# Patient Record
Sex: Female | Born: 1955 | Race: White | Hispanic: No | Smoking: Never smoker
Health system: Southern US, Community
[De-identification: ages and names within clinical notes are randomized; demographics above are authoritative.]

## PROBLEM LIST (undated history)

## (undated) DIAGNOSIS — J189 Pneumonia, unspecified organism: Secondary | ICD-10-CM

## (undated) DIAGNOSIS — T7840XA Allergy, unspecified, initial encounter: Secondary | ICD-10-CM

## (undated) DIAGNOSIS — I471 Supraventricular tachycardia, unspecified: Secondary | ICD-10-CM

## (undated) DIAGNOSIS — Z87442 Personal history of urinary calculi: Secondary | ICD-10-CM

## (undated) DIAGNOSIS — D649 Anemia, unspecified: Secondary | ICD-10-CM

## (undated) DIAGNOSIS — E669 Obesity, unspecified: Secondary | ICD-10-CM

## (undated) DIAGNOSIS — M1990 Unspecified osteoarthritis, unspecified site: Secondary | ICD-10-CM

## (undated) DIAGNOSIS — F988 Other specified behavioral and emotional disorders with onset usually occurring in childhood and adolescence: Secondary | ICD-10-CM

## (undated) DIAGNOSIS — T4145XA Adverse effect of unspecified anesthetic, initial encounter: Secondary | ICD-10-CM

## (undated) DIAGNOSIS — R0600 Dyspnea, unspecified: Secondary | ICD-10-CM

## (undated) DIAGNOSIS — D126 Benign neoplasm of colon, unspecified: Secondary | ICD-10-CM

## (undated) DIAGNOSIS — E039 Hypothyroidism, unspecified: Secondary | ICD-10-CM

## (undated) DIAGNOSIS — K219 Gastro-esophageal reflux disease without esophagitis: Secondary | ICD-10-CM

## (undated) DIAGNOSIS — I499 Cardiac arrhythmia, unspecified: Secondary | ICD-10-CM

## (undated) DIAGNOSIS — C50919 Malignant neoplasm of unspecified site of unspecified female breast: Secondary | ICD-10-CM

## (undated) DIAGNOSIS — Z8719 Personal history of other diseases of the digestive system: Secondary | ICD-10-CM

## (undated) DIAGNOSIS — K648 Other hemorrhoids: Secondary | ICD-10-CM

## (undated) HISTORY — PX: GASTRIC BYPASS: SHX52

## (undated) HISTORY — DX: Anemia, unspecified: D64.9

## (undated) HISTORY — DX: Malignant neoplasm of unspecified site of unspecified female breast: C50.919

## (undated) HISTORY — PX: BREAST LUMPECTOMY: SHX2

## (undated) HISTORY — DX: Allergy, unspecified, initial encounter: T78.40XA

## (undated) HISTORY — DX: Other specified behavioral and emotional disorders with onset usually occurring in childhood and adolescence: F98.8

## (undated) HISTORY — DX: Other hemorrhoids: K64.8

## (undated) HISTORY — DX: Hypothyroidism, unspecified: E03.9

## (undated) HISTORY — DX: Benign neoplasm of colon, unspecified: D12.6

## (undated) HISTORY — PX: COLONOSCOPY: SHX174

## (undated) HISTORY — PX: MASTECTOMY: SHX3

## (undated) HISTORY — PX: LITHOTRIPSY: SUR834

## (undated) HISTORY — PX: VAGINAL HYSTERECTOMY: SUR661

## (undated) HISTORY — DX: Obesity, unspecified: E66.9

## (undated) HISTORY — PX: KNEE ARTHROSCOPY: SHX127

## (undated) HISTORY — PX: JOINT REPLACEMENT: SHX530

## (undated) HISTORY — PX: ESOPHAGOGASTRODUODENOSCOPY: SHX1529

## (undated) HISTORY — PX: CHOLECYSTECTOMY: SHX55

---

## 1998-06-25 ENCOUNTER — Observation Stay (HOSPITAL_COMMUNITY): Admission: EM | Admit: 1998-06-25 | Discharge: 1998-06-26 | Payer: Self-pay

## 1998-06-27 ENCOUNTER — Ambulatory Visit (HOSPITAL_COMMUNITY): Admission: RE | Admit: 1998-06-27 | Discharge: 1998-06-27 | Payer: Self-pay | Admitting: Urology

## 1998-08-22 ENCOUNTER — Ambulatory Visit (HOSPITAL_COMMUNITY): Admission: RE | Admit: 1998-08-22 | Discharge: 1998-08-22 | Payer: Self-pay | Admitting: Urology

## 1999-04-17 ENCOUNTER — Other Ambulatory Visit: Admission: RE | Admit: 1999-04-17 | Discharge: 1999-04-17 | Payer: Self-pay | Admitting: Orthopedic Surgery

## 2000-06-26 ENCOUNTER — Encounter: Payer: Self-pay | Admitting: Urology

## 2000-06-26 ENCOUNTER — Ambulatory Visit (HOSPITAL_COMMUNITY): Admission: EM | Admit: 2000-06-26 | Discharge: 2000-06-26 | Payer: Self-pay | Admitting: Internal Medicine

## 2000-07-01 ENCOUNTER — Encounter: Payer: Self-pay | Admitting: Urology

## 2000-07-01 ENCOUNTER — Ambulatory Visit (HOSPITAL_COMMUNITY): Admission: RE | Admit: 2000-07-01 | Discharge: 2000-07-01 | Payer: Self-pay | Admitting: Urology

## 2001-03-11 ENCOUNTER — Other Ambulatory Visit: Admission: RE | Admit: 2001-03-11 | Discharge: 2001-03-11 | Payer: Self-pay | Admitting: Obstetrics and Gynecology

## 2002-01-03 ENCOUNTER — Encounter: Payer: Self-pay | Admitting: Oncology

## 2002-01-03 ENCOUNTER — Encounter: Admission: RE | Admit: 2002-01-03 | Discharge: 2002-01-03 | Payer: Self-pay | Admitting: Oncology

## 2002-01-03 ENCOUNTER — Encounter (INDEPENDENT_AMBULATORY_CARE_PROVIDER_SITE_OTHER): Payer: Self-pay | Admitting: Specialist

## 2002-07-21 ENCOUNTER — Encounter: Admission: RE | Admit: 2002-07-21 | Discharge: 2002-07-21 | Payer: Self-pay | Admitting: Oncology

## 2002-07-21 ENCOUNTER — Encounter: Payer: Self-pay | Admitting: Oncology

## 2002-12-28 DIAGNOSIS — T8859XA Other complications of anesthesia, initial encounter: Secondary | ICD-10-CM

## 2002-12-28 HISTORY — DX: Other complications of anesthesia, initial encounter: T88.59XA

## 2003-03-02 ENCOUNTER — Encounter: Admission: RE | Admit: 2003-03-02 | Discharge: 2003-03-02 | Payer: Self-pay | Admitting: Oncology

## 2003-03-02 ENCOUNTER — Encounter: Payer: Self-pay | Admitting: Oncology

## 2004-03-14 ENCOUNTER — Encounter: Admission: RE | Admit: 2004-03-14 | Discharge: 2004-03-14 | Payer: Self-pay | Admitting: Oncology

## 2005-03-27 ENCOUNTER — Encounter: Admission: RE | Admit: 2005-03-27 | Discharge: 2005-03-27 | Payer: Self-pay | Admitting: Oncology

## 2005-07-16 ENCOUNTER — Ambulatory Visit: Payer: Self-pay | Admitting: Oncology

## 2005-08-12 ENCOUNTER — Other Ambulatory Visit: Admission: RE | Admit: 2005-08-12 | Discharge: 2005-08-12 | Payer: Self-pay | Admitting: Obstetrics and Gynecology

## 2005-08-22 ENCOUNTER — Emergency Department (HOSPITAL_COMMUNITY): Admission: EM | Admit: 2005-08-22 | Discharge: 2005-08-22 | Payer: Self-pay | Admitting: Emergency Medicine

## 2006-04-30 ENCOUNTER — Encounter: Admission: RE | Admit: 2006-04-30 | Discharge: 2006-04-30 | Payer: Self-pay | Admitting: Oncology

## 2006-07-28 ENCOUNTER — Ambulatory Visit: Payer: Self-pay | Admitting: Oncology

## 2006-07-30 LAB — CBC WITH DIFFERENTIAL/PLATELET
Basophils Absolute: 0 10*3/uL (ref 0.0–0.1)
EOS%: 7.2 % — ABNORMAL HIGH (ref 0.0–7.0)
Eosinophils Absolute: 0.3 10*3/uL (ref 0.0–0.5)
HCT: 40.3 % (ref 34.8–46.6)
HGB: 13.7 g/dL (ref 11.6–15.9)
MCH: 30.2 pg (ref 26.0–34.0)
MCV: 89.1 fL (ref 81.0–101.0)
MONO%: 6.9 % (ref 0.0–13.0)
NEUT#: 2 10*3/uL (ref 1.5–6.5)
NEUT%: 51.3 % (ref 39.6–76.8)
Platelets: 158 10*3/uL (ref 145–400)
RDW: 14.3 % (ref 11.3–14.5)

## 2006-07-30 LAB — COMPREHENSIVE METABOLIC PANEL
AST: 19 U/L (ref 0–37)
Albumin: 4 g/dL (ref 3.5–5.2)
Alkaline Phosphatase: 80 U/L (ref 39–117)
BUN: 6 mg/dL (ref 6–23)
Calcium: 8.9 mg/dL (ref 8.4–10.5)
Creatinine, Ser: 0.62 mg/dL (ref 0.40–1.20)
Glucose, Bld: 94 mg/dL (ref 70–99)
Potassium: 4.4 mEq/L (ref 3.5–5.3)

## 2006-08-03 LAB — LIPID PANEL
HDL: 54 mg/dL (ref >39)
LDL Cholesterol: 71 mg/dL (ref 0–99)
Total CHOL/HDL Ratio: 2.6 Ratio
Triglycerides: 69 mg/dL (ref <150)
VLDL: 14 mg/dL (ref 0–40)

## 2007-05-06 ENCOUNTER — Ambulatory Visit: Payer: Self-pay | Admitting: Internal Medicine

## 2007-05-19 ENCOUNTER — Encounter: Admission: RE | Admit: 2007-05-19 | Discharge: 2007-05-19 | Payer: Self-pay | Admitting: Oncology

## 2007-05-20 ENCOUNTER — Encounter: Payer: Self-pay | Admitting: Internal Medicine

## 2007-05-20 ENCOUNTER — Encounter: Admission: RE | Admit: 2007-05-20 | Discharge: 2007-05-20 | Payer: Self-pay | Admitting: Oncology

## 2007-05-20 ENCOUNTER — Ambulatory Visit: Payer: Self-pay | Admitting: Internal Medicine

## 2007-05-24 ENCOUNTER — Encounter: Admission: RE | Admit: 2007-05-24 | Discharge: 2007-05-24 | Payer: Self-pay | Admitting: Oncology

## 2007-05-24 ENCOUNTER — Encounter (INDEPENDENT_AMBULATORY_CARE_PROVIDER_SITE_OTHER): Payer: Self-pay | Admitting: Radiology

## 2007-06-02 ENCOUNTER — Ambulatory Visit (HOSPITAL_COMMUNITY): Admission: RE | Admit: 2007-06-02 | Discharge: 2007-06-02 | Payer: Self-pay | Admitting: Surgery

## 2007-06-06 ENCOUNTER — Ambulatory Visit: Payer: Self-pay | Admitting: Oncology

## 2007-06-29 ENCOUNTER — Encounter (INDEPENDENT_AMBULATORY_CARE_PROVIDER_SITE_OTHER): Payer: Self-pay | Admitting: Obstetrics and Gynecology

## 2007-06-29 ENCOUNTER — Ambulatory Visit (HOSPITAL_COMMUNITY): Admission: RE | Admit: 2007-06-29 | Discharge: 2007-06-30 | Payer: Self-pay | Admitting: Obstetrics and Gynecology

## 2007-07-21 ENCOUNTER — Inpatient Hospital Stay (HOSPITAL_COMMUNITY): Admission: RE | Admit: 2007-07-21 | Discharge: 2007-07-23 | Payer: Self-pay | Admitting: Surgery

## 2007-07-21 ENCOUNTER — Encounter (INDEPENDENT_AMBULATORY_CARE_PROVIDER_SITE_OTHER): Payer: Self-pay | Admitting: Surgery

## 2007-08-04 ENCOUNTER — Ambulatory Visit: Payer: Self-pay | Admitting: Oncology

## 2007-08-08 LAB — CBC WITH DIFFERENTIAL/PLATELET
BASO%: 0.6 % (ref 0.0–2.0)
EOS%: 4.4 % (ref 0.0–7.0)
HCT: 34.5 % — ABNORMAL LOW (ref 34.8–46.6)
MCH: 29.2 pg (ref 26.0–34.0)
MCHC: 34.4 g/dL (ref 32.0–36.0)
NEUT%: 58.3 % (ref 39.6–76.8)
RBC: 4.06 10*6/uL (ref 3.70–5.32)
lymph#: 1.4 10*3/uL (ref 0.9–3.3)

## 2007-08-08 LAB — COMPREHENSIVE METABOLIC PANEL
ALT: 32 U/L (ref 0–35)
AST: 16 U/L (ref 0–37)
Chloride: 104 mEq/L (ref 96–112)
Creatinine, Ser: 0.59 mg/dL (ref 0.40–1.20)
Sodium: 142 mEq/L (ref 135–145)
Total Bilirubin: 0.4 mg/dL (ref 0.3–1.2)

## 2007-08-08 LAB — LIPID PANEL
Total CHOL/HDL Ratio: 3.1 Ratio
VLDL: 17 mg/dL (ref 0–40)

## 2007-08-19 ENCOUNTER — Encounter: Admission: RE | Admit: 2007-08-19 | Discharge: 2007-08-19 | Payer: Self-pay | Admitting: Oncology

## 2007-10-05 ENCOUNTER — Ambulatory Visit: Payer: Self-pay | Admitting: Oncology

## 2008-02-10 ENCOUNTER — Ambulatory Visit: Payer: Self-pay | Admitting: Oncology

## 2008-02-14 LAB — COMPREHENSIVE METABOLIC PANEL
ALT: 18 U/L (ref 0–35)
AST: 16 U/L (ref 0–37)
Albumin: 3.8 g/dL (ref 3.5–5.2)
Alkaline Phosphatase: 109 U/L (ref 39–117)
BUN: 6 mg/dL (ref 6–23)
CO2: 28 mEq/L (ref 19–32)
Calcium: 9.2 mg/dL (ref 8.4–10.5)
Chloride: 106 mEq/L (ref 96–112)
Creatinine, Ser: 0.59 mg/dL (ref 0.40–1.20)
Glucose, Bld: 92 mg/dL (ref 70–99)
Potassium: 4.1 mEq/L (ref 3.5–5.3)
Sodium: 142 mEq/L (ref 135–145)
Total Bilirubin: 0.4 mg/dL (ref 0.3–1.2)
Total Protein: 6.8 g/dL (ref 6.0–8.3)

## 2008-02-14 LAB — CBC WITH DIFFERENTIAL/PLATELET
BASO%: 0.4 % (ref 0.0–2.0)
Basophils Absolute: 0 10*3/uL (ref 0.0–0.1)
EOS%: 5.1 % (ref 0.0–7.0)
Eosinophils Absolute: 0.2 10*3/uL (ref 0.0–0.5)
HCT: 37.3 % (ref 34.8–46.6)
HGB: 12.4 g/dL (ref 11.6–15.9)
LYMPH%: 21.5 % (ref 14.0–48.0)
MCH: 27.1 pg (ref 26.0–34.0)
MCHC: 33.3 g/dL (ref 32.0–36.0)
MCV: 81.5 fL (ref 81.0–101.0)
MONO#: 0.1 10*3/uL (ref 0.1–0.9)
MONO%: 2.9 % (ref 0.0–13.0)
NEUT#: 3.3 10*3/uL (ref 1.5–6.5)
NEUT%: 70.1 % (ref 39.6–76.8)
Platelets: 185 10*3/uL (ref 145–400)
RBC: 4.58 10*6/uL (ref 3.70–5.32)
RDW: 14.8 % — ABNORMAL HIGH (ref 11.3–14.5)
WBC: 4.7 10*3/uL (ref 3.9–10.0)
lymph#: 1 10*3/uL (ref 0.9–3.3)

## 2008-02-28 ENCOUNTER — Inpatient Hospital Stay (HOSPITAL_COMMUNITY): Admission: RE | Admit: 2008-02-28 | Discharge: 2008-03-02 | Payer: Self-pay | Admitting: Plastic Surgery

## 2008-02-28 ENCOUNTER — Encounter (INDEPENDENT_AMBULATORY_CARE_PROVIDER_SITE_OTHER): Payer: Self-pay | Admitting: Plastic Surgery

## 2008-03-21 ENCOUNTER — Ambulatory Visit (HOSPITAL_COMMUNITY): Admission: RE | Admit: 2008-03-21 | Discharge: 2008-03-21 | Payer: Self-pay | Admitting: Plastic Surgery

## 2008-03-28 ENCOUNTER — Ambulatory Visit (HOSPITAL_COMMUNITY): Admission: RE | Admit: 2008-03-28 | Discharge: 2008-03-28 | Payer: Self-pay | Admitting: Plastic Surgery

## 2008-04-05 ENCOUNTER — Ambulatory Visit (HOSPITAL_COMMUNITY): Admission: RE | Admit: 2008-04-05 | Discharge: 2008-04-05 | Payer: Self-pay | Admitting: Plastic Surgery

## 2008-04-26 ENCOUNTER — Ambulatory Visit: Payer: Self-pay | Admitting: Family Medicine

## 2008-06-11 ENCOUNTER — Ambulatory Visit: Payer: Self-pay

## 2008-06-11 ENCOUNTER — Encounter: Payer: Self-pay | Admitting: Family Medicine

## 2008-06-20 ENCOUNTER — Ambulatory Visit: Payer: Self-pay | Admitting: Oncology

## 2008-06-22 LAB — CBC WITH DIFFERENTIAL/PLATELET
Basophils Absolute: 0 10*3/uL (ref 0.0–0.1)
EOS%: 6.3 % (ref 0.0–7.0)
LYMPH%: 30 % (ref 14.0–48.0)
MCH: 24.6 pg — ABNORMAL LOW (ref 26.0–34.0)
MCV: 74.3 fL — ABNORMAL LOW (ref 81.0–101.0)
MONO%: 8.2 % (ref 0.0–13.0)
Platelets: 163 10*3/uL (ref 145–400)
RBC: 4.8 10*6/uL (ref 3.70–5.32)
RDW: 25.9 % — ABNORMAL HIGH (ref 11.3–14.5)

## 2008-06-22 LAB — COMPREHENSIVE METABOLIC PANEL
AST: 19 U/L (ref 0–37)
Albumin: 3.8 g/dL (ref 3.5–5.2)
Alkaline Phosphatase: 92 U/L (ref 39–117)
BUN: 7 mg/dL (ref 6–23)
Potassium: 3.7 mEq/L (ref 3.5–5.3)
Sodium: 141 mEq/L (ref 135–145)
Total Bilirubin: 0.3 mg/dL (ref 0.3–1.2)

## 2008-07-23 ENCOUNTER — Ambulatory Visit: Payer: Self-pay | Admitting: Family Medicine

## 2008-08-13 ENCOUNTER — Ambulatory Visit: Payer: Self-pay | Admitting: Family Medicine

## 2008-09-17 ENCOUNTER — Ambulatory Visit (HOSPITAL_COMMUNITY): Admission: RE | Admit: 2008-09-17 | Discharge: 2008-09-17 | Payer: Self-pay | Admitting: Plastic Surgery

## 2008-09-24 ENCOUNTER — Ambulatory Visit: Payer: Self-pay | Admitting: Family Medicine

## 2008-09-24 ENCOUNTER — Ambulatory Visit (HOSPITAL_COMMUNITY): Admission: RE | Admit: 2008-09-24 | Discharge: 2008-09-24 | Payer: Self-pay | Admitting: Plastic Surgery

## 2008-09-26 ENCOUNTER — Ambulatory Visit: Payer: Self-pay | Admitting: Oncology

## 2008-09-28 LAB — CBC WITH DIFFERENTIAL/PLATELET
BASO%: 0.4 % (ref 0.0–2.0)
LYMPH%: 32.5 % (ref 14.0–48.0)
MCH: 29 pg (ref 26.0–34.0)
MCHC: 33.9 g/dL (ref 32.0–36.0)
MCV: 85.5 fL (ref 81.0–101.0)
MONO%: 8.6 % (ref 0.0–13.0)
Platelets: 159 10*3/uL (ref 145–400)
RBC: 4.45 10*6/uL (ref 3.70–5.32)

## 2008-09-28 LAB — COMPREHENSIVE METABOLIC PANEL
Alkaline Phosphatase: 103 U/L (ref 39–117)
Glucose, Bld: 98 mg/dL (ref 70–99)
Sodium: 140 mEq/L (ref 135–145)
Total Bilirubin: 0.5 mg/dL (ref 0.3–1.2)
Total Protein: 6.6 g/dL (ref 6.0–8.3)

## 2009-01-31 ENCOUNTER — Ambulatory Visit: Payer: Self-pay | Admitting: Oncology

## 2009-02-04 LAB — CBC WITH DIFFERENTIAL/PLATELET
BASO%: 0.6 % (ref 0.0–2.0)
LYMPH%: 33.7 % (ref 14.0–48.0)
MCHC: 33.7 g/dL (ref 32.0–36.0)
MCV: 86.3 fL (ref 81.0–101.0)
MONO#: 0.3 10*3/uL (ref 0.1–0.9)
MONO%: 7 % (ref 0.0–13.0)
Platelets: 171 10*3/uL (ref 145–400)
RBC: 4.63 10*6/uL (ref 3.70–5.32)
RDW: 13.7 % (ref 11.3–14.5)
WBC: 4.9 10*3/uL (ref 3.9–10.0)

## 2009-02-04 LAB — COMPREHENSIVE METABOLIC PANEL
ALT: 26 U/L (ref 0–35)
Alkaline Phosphatase: 99 U/L (ref 39–117)
Sodium: 140 mEq/L (ref 135–145)
Total Bilirubin: 0.5 mg/dL (ref 0.3–1.2)
Total Protein: 6.5 g/dL (ref 6.0–8.3)

## 2009-02-24 IMAGING — CR DG CHEST 2V
2 series · 2 of 2 positions shown · non-contrast
Comparison: None.

CLINICAL DATA: Right breast carcinoma.  Preop respiratory exam. 
 CHEST ? 2 VIEW:

[view not recorded (1 of 2)]
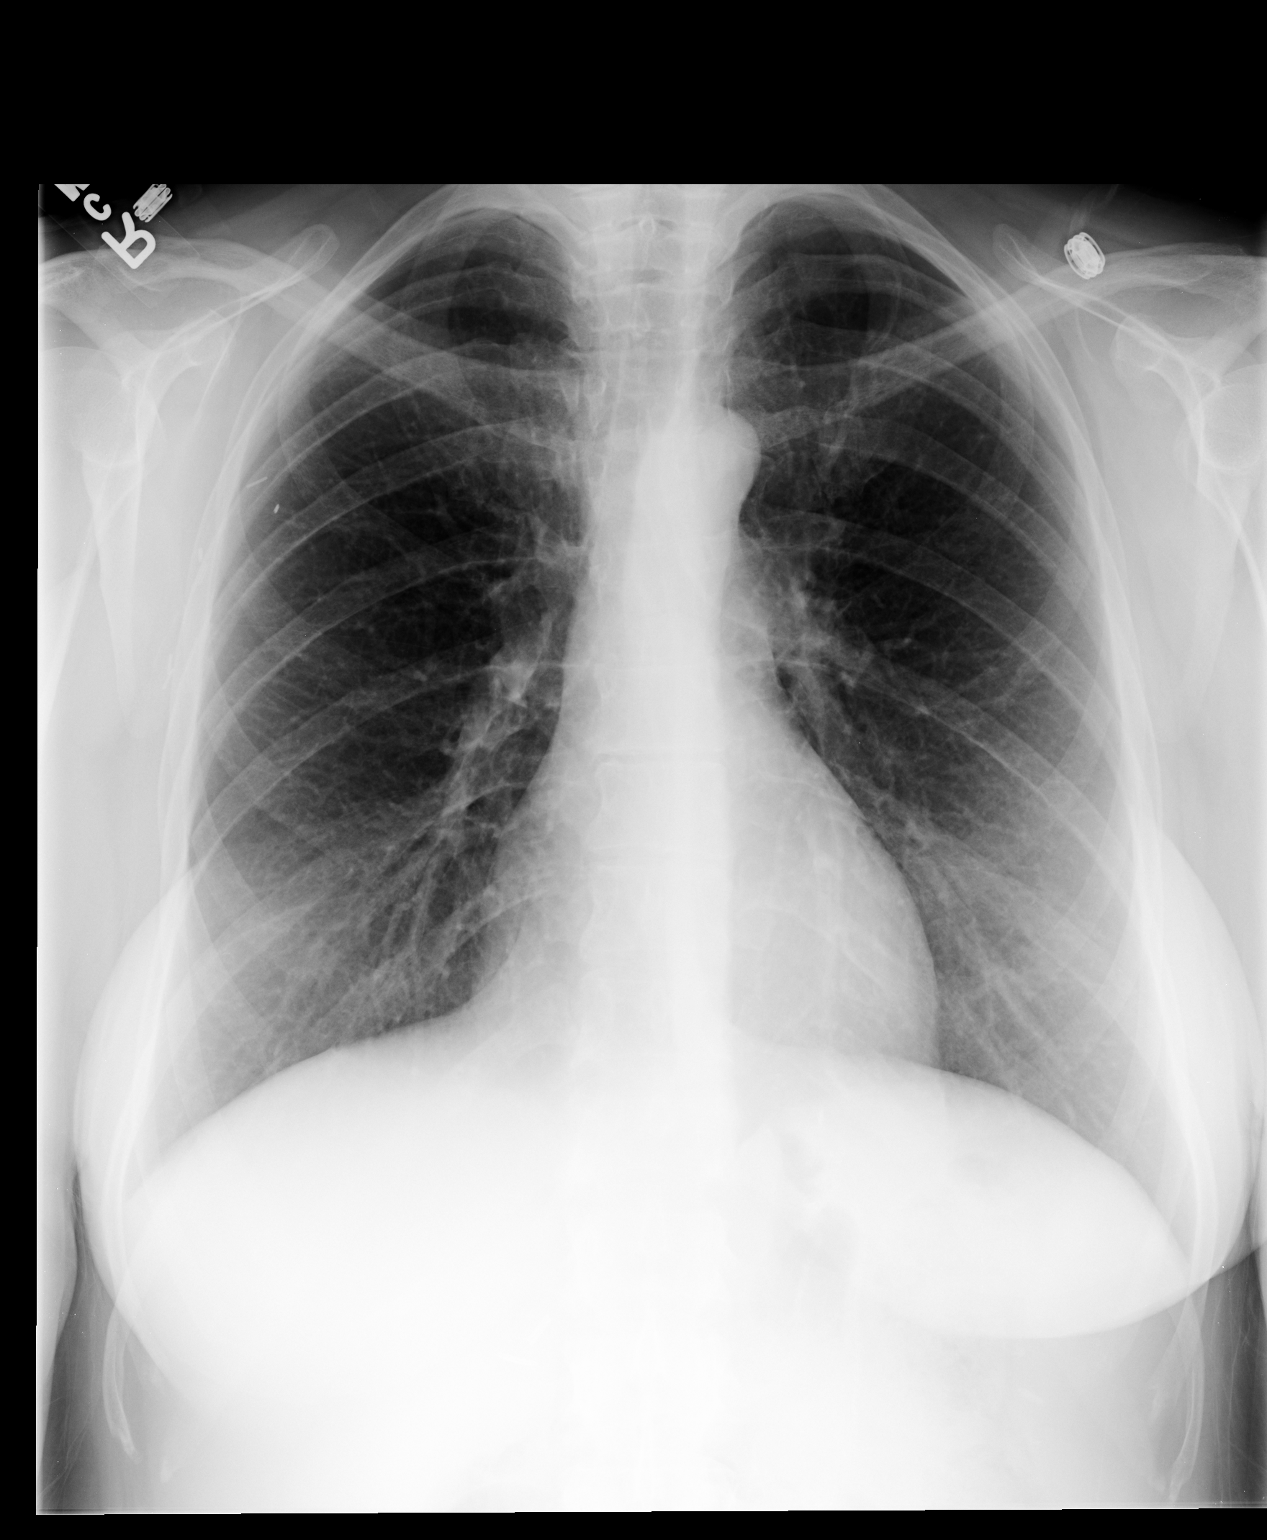

[view not recorded (2 of 2)]
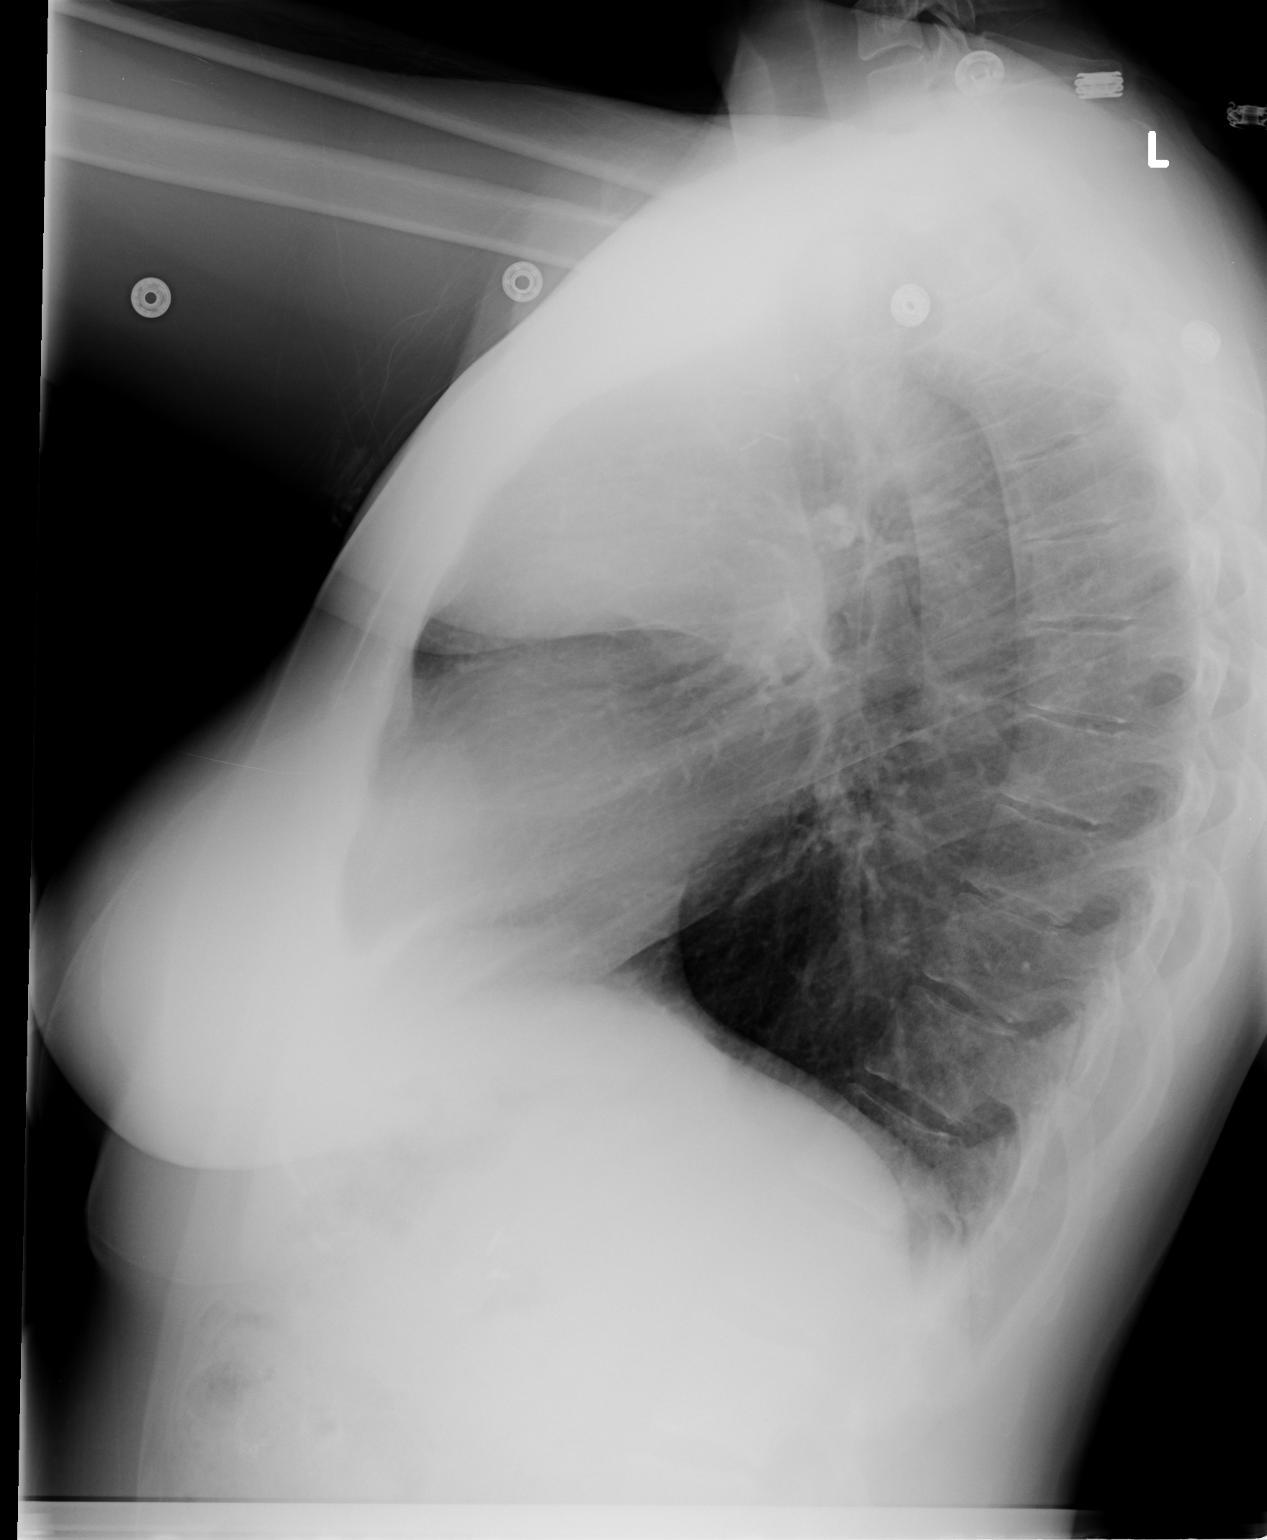

[2 of 2 positions shown; findings below may reference images not displayed]

FINDINGS: Heart size and mediastinal contours are normal.  Both lungs are clear.  There is no evidence of pleural effusion.  No mass or adenopathy is identified.  Surgical clips are seen within the right axillary region.
IMPRESSION: No active cardiopulmonary disease.

## 2009-03-14 ENCOUNTER — Ambulatory Visit: Payer: Self-pay | Admitting: Family Medicine

## 2009-03-14 ENCOUNTER — Encounter: Admission: RE | Admit: 2009-03-14 | Discharge: 2009-03-14 | Payer: Self-pay | Admitting: Family Medicine

## 2009-04-15 ENCOUNTER — Ambulatory Visit: Payer: Self-pay | Admitting: Family Medicine

## 2009-06-13 ENCOUNTER — Ambulatory Visit: Payer: Self-pay | Admitting: Oncology

## 2009-06-17 LAB — COMPREHENSIVE METABOLIC PANEL
ALT: 20 U/L (ref 0–35)
Albumin: 3.9 g/dL (ref 3.5–5.2)
CO2: 26 mEq/L (ref 19–32)
Potassium: 4.3 mEq/L (ref 3.5–5.3)
Sodium: 139 mEq/L (ref 135–145)
Total Bilirubin: 0.5 mg/dL (ref 0.3–1.2)
Total Protein: 6.5 g/dL (ref 6.0–8.3)

## 2009-06-17 LAB — CBC WITH DIFFERENTIAL/PLATELET
Eosinophils Absolute: 0.3 10*3/uL (ref 0.0–0.5)
MCV: 86.9 fL (ref 79.5–101.0)
MONO%: 10 % (ref 0.0–14.0)
NEUT#: 2 10*3/uL (ref 1.5–6.5)
RBC: 4.6 10*6/uL (ref 3.70–5.45)
RDW: 14.3 % (ref 11.2–14.5)
WBC: 3.9 10*3/uL (ref 3.9–10.3)

## 2009-09-16 ENCOUNTER — Encounter: Admission: RE | Admit: 2009-09-16 | Discharge: 2009-09-16 | Payer: Self-pay | Admitting: Oncology

## 2009-09-16 ENCOUNTER — Ambulatory Visit: Payer: Self-pay | Admitting: Family Medicine

## 2009-12-25 ENCOUNTER — Ambulatory Visit: Payer: Self-pay | Admitting: Oncology

## 2009-12-30 LAB — COMPREHENSIVE METABOLIC PANEL
AST: 25 U/L (ref 0–37)
Albumin: 4.2 g/dL (ref 3.5–5.2)
BUN: 7 mg/dL (ref 6–23)
Calcium: 8.6 mg/dL (ref 8.4–10.5)
Chloride: 103 mEq/L (ref 96–112)
Potassium: 4 mEq/L (ref 3.5–5.3)
Sodium: 139 mEq/L (ref 135–145)
Total Protein: 6.3 g/dL (ref 6.0–8.3)

## 2009-12-30 LAB — CBC WITH DIFFERENTIAL/PLATELET
Basophils Absolute: 0 10*3/uL (ref 0.0–0.1)
EOS%: 7.5 % — ABNORMAL HIGH (ref 0.0–7.0)
Eosinophils Absolute: 0.4 10*3/uL (ref 0.0–0.5)
HGB: 14 g/dL (ref 11.6–15.9)
MCH: 31 pg (ref 25.1–34.0)
NEUT#: 2.4 10*3/uL (ref 1.5–6.5)
RDW: 13.1 % (ref 11.2–14.5)
lymph#: 1.5 10*3/uL (ref 0.9–3.3)

## 2009-12-31 LAB — LIPID PANEL
LDL Cholesterol: 68 mg/dL (ref 0–99)
Total CHOL/HDL Ratio: 2.4 Ratio
Triglycerides: 54 mg/dL (ref <150)
VLDL: 11 mg/dL (ref 0–40)

## 2010-01-27 ENCOUNTER — Ambulatory Visit (HOSPITAL_COMMUNITY): Admission: RE | Admit: 2010-01-27 | Discharge: 2010-01-27 | Payer: Self-pay | Admitting: Plastic Surgery

## 2010-02-13 ENCOUNTER — Ambulatory Visit: Payer: Self-pay | Admitting: Oncology

## 2010-02-17 LAB — CBC WITH DIFFERENTIAL/PLATELET
BASO%: 0.9 % (ref 0.0–2.0)
Basophils Absolute: 0.1 10*3/uL (ref 0.0–0.1)
EOS%: 6.5 % (ref 0.0–7.0)
MCH: 29.9 pg (ref 25.1–34.0)
MCHC: 33.3 g/dL (ref 31.5–36.0)
MCV: 89.8 fL (ref 79.5–101.0)
MONO%: 8.6 % (ref 0.0–14.0)
RBC: 4.81 10*6/uL (ref 3.70–5.45)
RDW: 13.4 % (ref 11.2–14.5)

## 2010-04-10 ENCOUNTER — Ambulatory Visit: Payer: Self-pay | Admitting: Oncology

## 2010-04-14 LAB — CBC WITH DIFFERENTIAL/PLATELET
Basophils Absolute: 0 10*3/uL (ref 0.0–0.1)
Eosinophils Absolute: 0.3 10*3/uL (ref 0.0–0.5)
HCT: 41.2 % (ref 34.8–46.6)
HGB: 13.9 g/dL (ref 11.6–15.9)
MONO#: 0.5 10*3/uL (ref 0.1–0.9)
NEUT#: 3.2 10*3/uL (ref 1.5–6.5)
NEUT%: 57.9 % (ref 38.4–76.8)
RDW: 13.1 % (ref 11.2–14.5)
lymph#: 1.5 10*3/uL (ref 0.9–3.3)

## 2010-04-14 LAB — MORPHOLOGY: PLT EST: ADEQUATE

## 2010-07-10 ENCOUNTER — Ambulatory Visit: Payer: Self-pay | Admitting: Oncology

## 2010-08-11 ENCOUNTER — Ambulatory Visit: Payer: Self-pay | Admitting: Oncology

## 2010-08-13 LAB — CBC WITH DIFFERENTIAL/PLATELET
EOS%: 6 % (ref 0.0–7.0)
Eosinophils Absolute: 0.3 10*3/uL (ref 0.0–0.5)
LYMPH%: 24 % (ref 14.0–49.7)
MCH: 30.7 pg (ref 25.1–34.0)
MCV: 90.4 fL (ref 79.5–101.0)
MONO%: 8 % (ref 0.0–14.0)
NEUT#: 3.2 10*3/uL (ref 1.5–6.5)
Platelets: 153 10*3/uL (ref 145–400)
RBC: 4.48 10*6/uL (ref 3.70–5.45)
RDW: 13 % (ref 11.2–14.5)

## 2010-08-13 LAB — MORPHOLOGY

## 2010-08-13 LAB — COMPREHENSIVE METABOLIC PANEL
Albumin: 4.1 g/dL (ref 3.5–5.2)
BUN: 11 mg/dL (ref 6–23)
Calcium: 9 mg/dL (ref 8.4–10.5)
Chloride: 105 mEq/L (ref 96–112)
Creatinine, Ser: 0.64 mg/dL (ref 0.40–1.20)
Glucose, Bld: 132 mg/dL — ABNORMAL HIGH (ref 70–99)
Potassium: 4.3 mEq/L (ref 3.5–5.3)

## 2010-09-24 ENCOUNTER — Ambulatory Visit: Payer: Self-pay | Admitting: Physician Assistant

## 2010-09-26 ENCOUNTER — Encounter: Admission: RE | Admit: 2010-09-26 | Discharge: 2010-09-26 | Payer: Self-pay | Admitting: Oncology

## 2011-01-18 ENCOUNTER — Encounter: Payer: Self-pay | Admitting: Plastic Surgery

## 2011-01-18 ENCOUNTER — Encounter: Payer: Self-pay | Admitting: Oncology

## 2011-01-19 ENCOUNTER — Encounter: Payer: Self-pay | Admitting: Oncology

## 2011-02-11 ENCOUNTER — Other Ambulatory Visit: Payer: Self-pay | Admitting: Oncology

## 2011-02-11 ENCOUNTER — Encounter (HOSPITAL_BASED_OUTPATIENT_CLINIC_OR_DEPARTMENT_OTHER): Payer: Commercial Managed Care - PPO | Admitting: Oncology

## 2011-02-11 DIAGNOSIS — Z23 Encounter for immunization: Secondary | ICD-10-CM

## 2011-02-11 DIAGNOSIS — C50419 Malignant neoplasm of upper-outer quadrant of unspecified female breast: Secondary | ICD-10-CM

## 2011-02-11 DIAGNOSIS — M899 Disorder of bone, unspecified: Secondary | ICD-10-CM

## 2011-02-11 DIAGNOSIS — M949 Disorder of cartilage, unspecified: Secondary | ICD-10-CM

## 2011-02-11 LAB — CBC WITH DIFFERENTIAL/PLATELET
BASO%: 0.5 % (ref 0.0–2.0)
EOS%: 4.5 % (ref 0.0–7.0)
Eosinophils Absolute: 0.3 10*3/uL (ref 0.0–0.5)
LYMPH%: 19.8 % (ref 14.0–49.7)
MCH: 28.7 pg (ref 25.1–34.0)
MCHC: 33.3 g/dL (ref 31.5–36.0)
MCV: 86.1 fL (ref 79.5–101.0)
MONO%: 7.1 % (ref 0.0–14.0)
NEUT#: 4.2 10*3/uL (ref 1.5–6.5)
Platelets: 161 10*3/uL (ref 145–400)
RBC: 4.76 10*6/uL (ref 3.70–5.45)
RDW: 14.3 % (ref 11.2–14.5)

## 2011-02-11 LAB — COMPREHENSIVE METABOLIC PANEL
ALT: 26 U/L (ref 0–35)
AST: 21 U/L (ref 0–37)
Albumin: 4.2 g/dL (ref 3.5–5.2)
Alkaline Phosphatase: 118 U/L — ABNORMAL HIGH (ref 39–117)
Potassium: 4.3 mEq/L (ref 3.5–5.3)
Sodium: 140 mEq/L (ref 135–145)
Total Bilirubin: 0.3 mg/dL (ref 0.3–1.2)
Total Protein: 6.6 g/dL (ref 6.0–8.3)

## 2011-03-11 ENCOUNTER — Ambulatory Visit (INDEPENDENT_AMBULATORY_CARE_PROVIDER_SITE_OTHER): Payer: Commercial Managed Care - PPO | Admitting: Family Medicine

## 2011-03-11 DIAGNOSIS — F988 Other specified behavioral and emotional disorders with onset usually occurring in childhood and adolescence: Secondary | ICD-10-CM

## 2011-03-11 DIAGNOSIS — R5383 Other fatigue: Secondary | ICD-10-CM

## 2011-03-11 DIAGNOSIS — R5381 Other malaise: Secondary | ICD-10-CM

## 2011-03-11 DIAGNOSIS — R252 Cramp and spasm: Secondary | ICD-10-CM

## 2011-05-12 NOTE — Op Note (Signed)
NAMEJERIS, Christy [name] NO.:  [id number]   MEDICAL RECORD NO.:  [id number]          PATIENT TYPE:  INP   LOCATION:  5739                         FACILITY:  [facility]   PHYSICIAN:  Etter Sjogren, M.D.     DATE OF BIRTH:  [birth year]   DATE OF PROCEDURE:  07/21/2007  DATE OF DISCHARGE:                               OPERATIVE REPORT   PREOPERATIVE DIAGNOSIS:  Breast cancer with bilateral planned  mastectomy.   POSTOPERATIVE DIAGNOSIS:  Breast cancer with bilateral planned  mastectomy with bilateral acquired absence of the breast.   PROCEDURE PERFORMED:  1. Breast reconstruction left breast with a tissue expander.  2. Breast reconstruction with a tissue expander right breast with an      extensive capsulorrhaphy and greater than usual services for      positioning of the tissue expander.   SURGEON:  Etter Sjogren, M.D.   ANESTHESIA:  General.   ESTIMATED BLOOD LOSS:  Minimal.   DRAINS:  There were two Blake drains left on each side.   CLINICAL NOTE:  A 55 year old woman who has breast cancer.  She has had  a previous lumpectomy and radiation on the right and has now experienced  failure of that with recurrent breast cancer.  She has had previous  reconstruction with placement of implants.  The right side had slipped  inferiorly and her inframammary crease was extremely low compared to the  opposite site and it would require extensive repair work in order to try  to better position the tissue expander on the right side.  All of this  was discussed with her in detail.  Muscle flap reconstruction was  recommended to her either using the TRAM reconstruction of her  latissimus because of her previous radiation, however, she refused.  She  elected to just have tissue expanders placed, and she understood this  placed her in extreme risk for tissue expander malfunction with exposure  of the tissue expander, loss of skin, and the possibility of exposure of  expanders with  failed reconstructions and the necessity for further  surgeries including major muscle flap surgeries in order to try to  salvage her reconstruction.  She understood all of this very clearly.  She also requested skin sparing mastectomies and she understood this  would place her at risk for skin loss especially on the right side where  she had the previous radiations.  This was discussed with Dr. Cicero Duck who agreed to go ahead with the skin sparing mastectomy with the  understanding that she might end up with skin loss and exposure of her  underlying muscle and even exposure of underlying tissue expanders.  She  wished to proceed.   DESCRIPTION OF PROCEDURE:  General surgery procedure being completed,  the dissection was carried deep to the pectoralis major muscles and the  underlying implants removed.  The wounds were irrigated thoroughly with  saline and meticulous hemostasis with electrocautery.  Blake drains were  positioned, brought through separate stab wounds inferolaterally and  secured with 3-0 plain sutures.  On the right side, the capsule was  raised in a cephalad direction in order to make space for repair of the  inframammary crease.  This was performed with 3-0 Vicryl interrupted  sutures tacking the skin down to the underlying chest wall with care  being taken to avoid damage to the underlying chest cavity and care  being taken to avoid damage to the overlying skin.  These were placed to  recreate the inframammary crease on the right side.  In addition, some  skin was removed medial to the previous skin excision by Dr. Jamey Ripa  because there appeared to be a little bit of vascular compromise in that  area.  The wounds having been irrigated with saline and antibiotic  solution having been placed and allowed to dwell, the right axillary  wound was closed with 3-0 PDS interrupted inverted deep sutures and  running 3-0 Monocryl subcuticular and 3-0 Prolene in simple  running  suture.  The Blake drains were secured with 3-0 Prolene sutures as well.  The implants were then prepared.  These were Allergan tissue expanders  500 mL, style on the right side, lot #[id number] and on the left  side lot #[id number].  After thoroughly cleaning gloves, the air was  removed and sterile saline placed using the closed filling system.  A  total of 300 mL placed.  The expanders were also soaked in antibiotic  solution for greater than 5 minutes.  The expanders were then positioned  and the muscles closed over them using 3-0 Vicryl interrupted figure-of-  eight sutures taking great care to avoid damage to the underlying  implants.  The remainder of the wound closure was completed with 3-0  Monocryl interrupted, inverted deep sutures and 3-0 Monocryl running  subcuticular suture.  The skin appeared to be viable at this point,  although, obviously still there will be long-term concern about the skin  flaps on that right side, especially where she had her previous  radiation.  Steri-Strips, dry sterile dressing with ABDs and a light Ace  wrap was then applied and she was transferred to the recovery room in  stable condition, having tolerated the procedure well.      Etter Sjogren, M.D.  Electronically Signed     DB/MEDQ  D:  07/21/2007  T:  07/21/2007  Job:  [id number]

## 2011-05-12 NOTE — Op Note (Signed)
NAMEKALLA, WATSON NO.:  [id number]   MEDICAL RECORD NO.:  [id number]          PATIENT TYPE:  INP   LOCATION:  5739                         FACILITY:  [facility]   PHYSICIAN:  Currie Paris, M.D.DATE OF BIRTH:  1956/09/23   DATE OF PROCEDURE:  07/21/2007  DATE OF DISCHARGE:                               OPERATIVE REPORT   PREOPERATIVE DIAGNOSIS:  Right breast cancer status post prior  lumpectomy, radiation and axillary dissection for right breast cancer  many years ago.   POSTOPERATIVE DIAGNOSIS:  Right breast cancer status post prior  lumpectomy, radiation and axillary dissection for right breast cancer  many years ago.   OPERATION:  Right total mastectomy with blue dye injection and sentinel  lymph node biopsy, left total mastectomy skin sparing.   SURGEON:  Currie Paris, M.D.   ASSISTANT:  Anselm Pancoast. Zachery Dakins, M.D.   ANESTHESIA:  General.   CLINICAL HISTORY:  Ms. Gores has presented with a breast cancer in  the right upper outer quadrant.  She has had a prior right lumpectomy  with axillary dissection by Dr. Meryl Crutch many years ago for a right  breast cancer.  After a lengthy review of her situation, she wished to  have bilateral mastectomies with skin sparing as much as possible with  removal of her prior implants and replacement by Dr. Odis Luster with tissue  expanders.  We had a long talk about the risks and complications.  She  understood that with a long flaps, especially on irradiated skin, she  might have issues with skin healing.  She also understood that we are  going to attempt to find a sentinel node but with her node dissection,  that might not be possible.  After all considerations had been  discussed, she elected to proceed.   DESCRIPTION OF PROCEDURE:  The patient was seen in the holding area and  there no further questions.  I initialed the right breast as the  operative side.  She had already been injected with her  technetium.  The  patient was taken to the operating room. After satisfactory general  anesthesia had been obtained, a Foley catheter was placed.  The breast  was prepped with alcohol and a time out occurred.  I injected 5 mL of  dilute methylene blue subareolarly and massaged this in.  Both breasts  were then prepped and draped.  Dr. Odis Luster made some marks to locate the  inframammary folds and these were redone so we did not loose them in the  prep process.   I started with the left side since I wanted to give some extra time for  any blue dye to get into the lymph node.  I made a circular incision  around the areola.  I used that as the entire incision.  This was,  therefore, a skin sparing mastectomy.  I raised flaps subcutaneously all  the way to the sternum, clavicle to the axilla, laterally to latissimus,  and inferior to the inframammary fold.  This took extra time because of  the prior placement of an implant and we had  to try to get this off  without damaging too much of the capsule.  Once I got the skin flaps  done, I removed the breast starting medially and working laterally,  again leaving the fascia on the muscle but trying to preserve the  capsule and the implant.  I labeled the breast for orientation and sent  this to pathology.  I irrigated, made sure everything was dry, and  placed a moist lap.   I started on the left side and using the Neoprobe, identified an area in  the axilla but got only some minimal counts.  I made a transverse  incision, divided subcutaneous tissues, and placed a self-retaining  retractor.  There was a little fatty tissue subpectorally which I was  able to dissect out.  There was some more fatty tissue around the long  thoracic nerve which I dissected up and that had some counts in it, but  I could never clearly identify a lymph node, but I removed that and sent  it to pathology.  There was also a little tissue a little more  inferiorly  subpectorally that had a few counts, but again, not a  clear  cut lymph node, but I took this out, as well.  Instead of the usual  approximately 10-15 minutes to identify a node, this took me an hour due  to the prior surgery and the difficulty in finding any counts and there  was no blue dye noted.   I put a pack in here and made an incision similar to the left  circumareolar, lifted skin flaps identical to the left side, and removed  the breast in identical fashion.  This was even more difficult than the  left side because the implant on the right had slipped and there was a  very small plane between the skin, subcu and the capsule along the  inframammary fold area.  Nevertheless, I got the breast off intact and  divided its lateral attachments to the pectoralis and then made sure  everything was dry.  I took a look back at the axillary incision area  and it was dry.  I pinched both the long thoracic and thoracodorsal  nerves and they seemed to work okay.  They were well visible because of  the prior surgery and scar tissue and our dissection had been gotten  this cleared up.  There were no palpable abnormal lymph nodes any place  in the axilla to suggest metastases from her new breast cancer.   At this point, Dr. Odis Luster came in to remove the implants, put in the new  ones, and to close.  She tolerated my portion of procedure well.  Estimated blood loss about 200 mL to 250 mL.      Currie Paris, M.D.  Electronically Signed     CJS/MEDQ  D:  07/21/2007  T:  07/21/2007  Job:  [id number]

## 2011-05-12 NOTE — Discharge Summary (Signed)
[name], [name]               ACCOUNT NO.:  [id number]   MEDICAL RECORD NO.:  [id number]          PATIENT TYPE:  OIB   LOCATION:  9320                          FACILITY:  [facility]   PHYSICIAN:  Guy Sandifer. Henderson Cloud, M.D. DATE OF BIRTH:  1956-07-03   DATE OF ADMISSION:  06/29/2007  DATE OF DISCHARGE:  06/30/2007                               DISCHARGE SUMMARY   ADMITTING DIAGNOSIS:  Menometrorrhagia.   DISCHARGE DIAGNOSIS:  Menometrorrhagia.   PROCEDURE:  On June 29, 2007, laparoscopically-assisted vaginal  hysterectomy with bilateral salpingo-oophorectomy.   REASON FOR ADMISSION:  This patient is a 55 year old widowed white  female, status post tubal ligation with heavy perimenopausal bleeding.  She is admitted for surgical management.   HOSPITAL COURSE:  The patient admitted to the hospital, undergoes the  above procedure.  Estimated blood loss is 350 mL.  On the evening of  surgery, she has stable vital signs, is afebrile with clear urine  output.  She is having good pain relief, is ambulating well at that  time.  On the day of discharge, she is passing flatus, tolerating  regular diet, voiding, and ambulating.  Vital signs are stable, and she  is afebrile.  Hemoglobin is 11.7.  Pathology is pending.   CONDITION ON DISCHARGE:  Good.   DIET:  Regular as tolerated.   ACTIVITY:  No lifting.  No operation of automobiles, no vaginal entry.  She is to call the office for problems included but not limited to  temperature of 101 degrees, persistent nausea or vomiting or vaginal  bleeding.   FOLLOW UP:  In the office in 2 weeks.   MEDICATIONS:  1. Percocet 5/325, #40, 1-2 p.o. q.6h. p.r.n.  2. Ibuprofen 600 mg q.6h. p.r.n.  3. Iron supplement 1 p.o. every other day.      Guy Sandifer Henderson Cloud, M.D.  Electronically Signed     JET/MEDQ  D:  06/30/2007  T:  06/30/2007  Job:  [id number]

## 2011-05-12 NOTE — H&P (Signed)
Christy [name], Christy [name]               ACCOUNT NO.:  [id number]   MEDICAL RECORD NO.:  [id number]          PATIENT TYPE:  AMB   LOCATION:  SDC                           FACILITY:  [facility]   PHYSICIAN:  Guy Sandifer. Henderson Cloud, M.D. DATE OF BIRTH:  1956/06/18   DATE OF ADMISSION:  06/29/2007  DATE OF DISCHARGE:                              HISTORY & PHYSICAL   CHIEF COMPLAINT:  Heavy, irregular menses.   HISTORY OF PRESENT ILLNESS:  This patient is a 55 year old widowed white  female, status post tubal ligation, who has been perimenopausal, but has  had recurrent heavy menses.  Ultrasound in my office on June 10, 2007  revealed the uterus measuring 7.97 x 4.4 x 5.6 cm.  Sonohystogram  revealed an 8 mm polypoid structure in the endometrial cavity.  The left  ovary was essentially normal.  Endometrial biopsy on June 10, 2007 was  benign.   After discussion of the options, she is being admitted with  laparoscopically assisted vaginal hysterectomy with a bilateral salpingo-  oophorectomy.  Of note, she also has recurrent breast cancer and will be  undergoing mastectomy in the relatively near future.   PAST MEDICAL HISTORY:  1. Breast cancer.  2. Depression.   PAST SURGICAL HISTORY:  1. Right breast cancer with right mastectomy and reconstructive      surgery bilaterally in 1993.  2. Cholecystectomy in 1995.  3. Bariatric surgery.  4. Tubal ligation.   OBSTETRICAL HISTORY:  1. Cesarean sections x2.  2. Miscarriage x1.   FAMILY HISTORY:  Coronary artery disease in father.  Chronic  hypertension in mother and father.  Renal disease in father.  Lung  disease in father.  Diabetes in mother.   MEDICATIONS:  Effexor XR 150 mg daily.   No known drug allergies.   SOCIAL HISTORY:  Denies tobacco, alcohol, or drug abuse.   REVIEW OF SYSTEMS:  NEURO:  Denies headache.  CARDIAC:  Denies chest  pain.  PULMONARY:  Denies shortness of breath.  GI:  Denies recent  changes in bowel habits.   PHYSICAL  EXAMINATION:  VITAL SIGNS:  Height 5 feet 6 inches.  Weight  178.8 pounds.  Blood pressure 120/74.  NECK:  Without thyromegaly.  LUNGS:  Clear to auscultation.  HEART:  Regular rate and rhythm.  BACK:  Without CVA tenderness.  BREASTS:  Status post right mastectomy with reconstruction.  Left breast  implant.  No dominant mass, traction, or discharge bilaterally.  ABDOMEN:  Soft and nontender without palpable masses.  PELVIC:  Vulva, vagina, and cervix without lesion.  The uterus is upper-  normal size, mobile, nontender.  Adnexa nontender without masses.  EXTREMITIES:  Grossly within normal limits.  NEUROLOGIC:  Grossly within normal limits.   ASSESSMENT:  Menometorrhagia.   PLAN:  Laparoscopically assisted vaginal hysterectomy with bilateral  salpingo-oophorectomy.      Guy Sandifer Henderson Cloud, M.D.  Electronically Signed     JET/MEDQ  D:  06/28/2007  T:  06/28/2007  Job:  [id number]

## 2011-05-12 NOTE — Op Note (Signed)
NAMEGYNETH, HUBKA               ACCOUNT NO.:  [id number]   MEDICAL RECORD NO.:  [id number]          PATIENT TYPE:  OIB   LOCATION:  9320                          FACILITY:  [facility]   PHYSICIAN:  Guy Sandifer. Henderson Cloud, M.D. DATE OF BIRTH:  1956-02-11   DATE OF PROCEDURE:  06/29/2007  DATE OF DISCHARGE:                               OPERATIVE REPORT   PREOPERATIVE DIAGNOSIS:  Menorrhagia.   POSTOPERATIVE DIAGNOSIS:  Menorrhagia.   PROCEDURE:  Laparoscopically-assisted vaginal hysterectomy with  bilateral salpingo-oophorectomy.   SURGEON:  Guy Sandifer. Henderson Cloud, M.D.   ASSISTANT:  Zelphia Cairo, M.D.   ANESTHESIA:  General endotracheal intubation.   SPECIMENS:  Uterus, bilateral tubes and ovaries to pathology.   ESTIMATED BLOOD LOSS:  350 mL.   INDICATIONS:  This patient is 55 year old white female with heavy  perimenopausal bleeding.  She also has recurrent breast cancer.  Details  dictated in history and physical.  Laparoscopically-assisted vaginal  hysterectomy with bilateral salpingo-oophorectomy has been discussed  preoperatively.  Potential risks and complications discussed  preoperatively including but not limited to infection, bowel, bladder,  ureteral damage, bleeding requiring transfusion of blood products with  possible transfusion reaction, HIV and hepatitis acquisition, DVT, PE,  pneumonia, fistula formation and laparotomy.  All questions were  answered and consent is signed on the chart.   FINDINGS:  Upper abdomen is grossly normal.  The uterus was upper normal  size.  Anterior cul-de-sac had some scarring. The vesicouterine  peritoneum status post C-section.  There are some small vascular clips  that have fallen into the anterior cul-de-sac.  The left ovary contains  a 3-cm smooth translucent cyst.  The right ovary is normal.  Posterior  cul-de-sac is normal.   PROCEDURE:  The patient is taken to operating room where she is  identified, placed in dorsosupine position.   General anesthesia is  induced via endotracheal intubation.  She is then placed in dorsal  lithotomy position where she is prepped abdominally and vaginally,  bladder straight catheterized.  Hulka tenaculum is placed in uterus as a  manipulator and she is draped in sterile fashion.  The infraumbilical  and suprapubic areas injected in the midline with 0.5% plain Marcaine.  A small infraumbilical incision is made and a disposable Veress needle  was placed on the first attempt without difficulty.  Syringe and drop  test are normal.  2 liters of gas were insufflated under low pressure  with good tympany in the right upper quadrant.  Veress needle was  removed and a 10/11 XL bladeless disposable trocar sleeve was placed  using direct visualization with the diagnostic laparoscope.  Then  switching to the operative laparoscopic.  A small suprapubic incision is  made in the midline and a 5-mm XL bladeless disposable trocar sleeve was  placed under direct visualization without difficulty.  The above  findings were noted.  Then using the gyrus bipolar cautery cutting  instrument the right infundibulopelvic ligaments taken down, its carried  along the mesosalpinx across the round ligament and down to the  vesicouterine peritoneum.  Good hemostasis is maintained.  Similar  procedure is carried out on the left side.  The vesicouterine peritoneum  is then taken down cephalolaterally sharply.  Suprapubic trocar sleeve  was removed.  Instruments are removed.  Attention is turned to the  vagina.  The cervix is circumscribed with a scalpel and the mucosa is  advanced sharply and bluntly.  Then using the gyrus bipolar cautery  instrument the uterosacral ligaments taken down bilaterally followed by  the bladder pillars.  The cul-de-sacs are then entered anteriorly and  posteriorly.  Progressive bites were taken up above the uterine vessels  bilaterally.  Fundus is delivered posteriorly with the tubes and   ovaries.  Pedicles are taken down, the specimen is completely delivered.  Uterosacral ligaments then plicated vaginal cuff bilaterally with 0  Monocryl suture.  All suture will be 0 Monocryl unless otherwise  designated.  Uterosacral ligaments then plicated midline with a third  suture.  The cuff is closed with figure-of-eights.  Foley catheter is  placed in the bladder and clear urine is noted.  Attention is turned  back to the abdomen.  After inducing pneumoperitoneum the suprapubic  trocar sleeve is reintroduced as before under direct visualization.  Minor bleeding at the peritoneal edges is controlled with bipolar  cautery.  Excess fluid is removed and good hemostasis is noted.  The  suprapubic trocar sleeve was removed.  Pneumoperitoneum is completely  taken down and the umbilical trocar sleeve was removed.  0 Vicryl suture  is used to reapproximate the subcutaneous tissue and the umbilical  incision with a single stitch.  Both incisions then closed with  Dermabond.  All counts are correct.  The patient is awakened, taken to  recovery room in stable condition.      Guy Sandifer Henderson Cloud, M.D.  Electronically Signed     JET/MEDQ  D:  06/29/2007  T:  06/29/2007  Job:  [id number]

## 2011-05-12 NOTE — Discharge Summary (Signed)
Christy [name], KASER NO.:  [id number]   MEDICAL RECORD NO.:  [id number]          PATIENT TYPE:  INP   LOCATION:  5152                         FACILITY:  [facility]   PHYSICIAN:  Etter Sjogren, M.D.     DATE OF BIRTH:  12/28/1956   DATE OF ADMISSION:  02/28/2008  DATE OF DISCHARGE:  03/02/2008                               DISCHARGE SUMMARY   FINAL DIAGNOSIS:  Breast cancer, radiation injury right chest, bilateral  acquired absence of breasts.   PROCEDURE PERFORMED:  Bilateral breast reconstruction with transverse  rectus abdominis myocutaneous flaps and placement of the On-Q     pump.   SUMMARY OF HISTORY AND PHYSICAL:  A 55 year old woman who has had breast  cancer, has had loss of some tissue on the inferior mastectomy flap on  the right, has a past history of radiation to the right chest, has a  tissue expander in place on the left that has done well and has decided  that she would like to have breast reconstruction using her own tissue  from the abdomen.  This procedure was discussed with her in great  detail, risks plus complications, overall convalescence and she  understood the procedure and the risks and possibly wound healing  problems due to previous radiation and wished to proceed.  She also  understood that the right side would be somewhat smaller than the left  because of the previous radiation.   HOSPITAL COURSE:  On admission she was taken to the operating room and  the breast reconstruction was performed with bilateral tram flaps.  She  tolerated that well.  Postoperatively, she had a low-grade fever that  responded to ambulation and incentive spirometry.  Hemoglobin stabilized  at 8.7.  She was asymptomatic from that.  She was ambulating well and  tolerating a diet.  Drains were functioning.  No evidence of any  bleeding or any hematoma.  Flaps had excellent color as did the distal  abdominal flap closure with capillary refill 1-2 seconds at all  sites.  By the 3rd postoperative day, it was felt that she was ready to be  discharged.  Her urinalysis was normal.   1. She was discharged on a regular diet.  2. No shower.  3. Empty drains 3-4 times a day and record the amount.  4. Use incentive spirometer at least 8 times a day at home.   ACTIVITY RESTRICTIONS:  Include:  Raising her arms over her head,  lifting, any type of exercising or vigorous activities.   PRESCRIPTIONS:  1. Percocet 5 mg, total of 40 given, 1-2 p.o. q.4-6 h. p.r.n. pain.  2. Robaxin 500 mg p.o. q.8 h.  3. Keflex 500 mg p.o. q.i.d.   She will follow up in the office next week.      Etter Sjogren, M.D.  Electronically Signed     DB/MEDQ  D:  03/02/2008  T:  03/02/2008  Job:  [id number]

## 2011-05-12 NOTE — Op Note (Signed)
Christy [name], MACKIEWICZ NO.:  [id number]   MEDICAL RECORD NO.:  [id number]          PATIENT TYPE:  INP   LOCATION:  2899                         FACILITY:  [facility]   PHYSICIAN:  Etter Sjogren, M.D.     DATE OF BIRTH:  [birth year]   DATE OF PROCEDURE:  02/28/2008  DATE OF DISCHARGE:                               OPERATIVE REPORT   PREOPERATIVE DIAGNOSIS:  Breast cancer with bilateral acquired absence  of breasts and radiation change on the right.   POSTOPERATIVE DIAGNOSIS:  Breast cancer with bilateral acquired absence  of breasts and radiation change on the right.   PROCEDURES PERFORMED:  1. Right breast reconstruction using a transverse rectus abdominis      myocutaneous flap.  2. Left breast reconstruction using a transverse rectus abdominis      myocutaneous flap.  3. Placement of an On-Q pain catheter.   SURGEON:  Etter Sjogren, M.D.   ASSISTANT:  Pleas Patricia, M.D.   ANESTHESIA:  General.   BLOOD LOSS:  500 mL.   DRAINS:  Four Blakes were left, one each chest and two abdomen.   CLINICAL NOTE:  A 55 year old woman who has had breast cancer.  Had  lumpectomy and radiation, then subsequently failed and had a recurrent  breast cancer.  Underwent bilateral mastectomy and lost the inferior  mastectomy flap from the mastectomy, a portion of it, and therefore had  an exposed tissue expander prior to even any expansion.  She had the  tissue expander removed and then decided to have bilateral TRAM  reconstruction.  The procedure and the risks were discussed with her in  great detail.  She understood the risks and possible complications and  the fact that both sides would be quite different because of the  radiation on the right side.  She did elect to have the left side as  well, although initially she wanted to have an implant there.  We also  talked to her about leaving a small skin paddle exposed on the left side  as the site for the eventual nipple  reconstruction and to give [place] an  opportunity to monitor the flap, and she was in agreement with that as  well.  She wished to proceed.   PROCEDURE:  The patient was marked in full standing position in the  holding area.  She was taken to the operating room and placed supine.  After induction of general anesthesia she was prepped with Betadine and  draped with sterile drapes.  The old scar was opened on the right and  the pocket then developed for the placement of the flap and the  subcutaneous tunnel begun from superior towards the inferior just above  the fascia.  On the left side the scar was opened and the old tissue  expander was removed and the space opened slightly in a superior  direction using the electrocautery.  A thorough irrigation with saline  and meticulous hemostasis with electrocautery.  Blake drains were  positioned brought out through separate stab wounds laterally and  secured with 3-0 Prolene sutures.  Moist laps  placed in these defects.   An incision then made around the umbilicus, generous fatty stalk left  with it.  The upper limb of the incision was then made, the dissection  carried down through the subcutaneous tissue using electrocautery,  beveling in a cephalad direction to capture additional perforators.  Care was taken to avoid damage to underlying rectus fascia and the  dissection was continued in a cephalad direction and through-and-through  connections made to the subcutaneous tunnels that had been created from  above.  The lower limb of the incision was then made.  There was a  little seroma cavity inferiorly and this was excised.  The flaps were  elevated from lateral towards medial to the lateral row of perforators.  The flaps were divided in the midline so we would have one for each  side.  Parallel incisions made in the anterior rectus sheaths leaving a  2-cm attachment anteriorly and the rectus muscles gently dissected out  the fascial attachments  using the bipolar and a scalpel as necessary.  The rectus muscles were divided inferiorly.  Deep inferior epigastric  vessels were ligaclipped and divided.  The lateral perforating vessels  of the intercostal were also mobilized and ligaclipped and divided.  The  mastectomy pockets were then irrigated thoroughly with saline and  excellent hemostasis was confirmed.  The flaps were passed through the  subcutaneous tunnels and brought out.  On the left side an opening was  made just for the nipple site and on the right side the skin paddle.  The abdomen was irrigated thoroughly with saline.  Excellent hemostasis  was confirmed.  The fascial closure with 0 Prolene interrupted figure-of-  eight sutures, right side closed completely primarily and the left side  a small opening just below the umbilicus.  Thorough irrigation with  saline and the onlay mesh was then placed, and it was secured around the  small opening of fascia using 0 Prolene sutures, of all this done with  great care being taken to avoid damage to the intra-abdominal contents.  After thorough irrigation with saline the mesh then fixed in position  using a 2-0 Prolene running simple suture around the periphery of it and  the umbilicus brought through an opening in it.  Blake drains were  positioned and brought through separate stab wounds inferiorly and  secured with 3-0 Prolene suture.  The On-Q catheter was also positioned,  one catheters on each side.  The closure with 2-0 Vicryl interrupted,  inverted deep sutures at the inferior aspect centrally, 2-0 Monocryl  interrupted, inverted deep dermal sutures, running 2-0 Monocryl  subcuticular suture.  An incision made in the midline and the umbilicus  brought through this opening.  It was inset with 3-0 Monocryl simple  interrupted, inverted deep dermal sutures and it was viable, as were the  skin flaps.  Attention was then directed to the chest, where the flaps  had excellent  color.  De-epithelialized and then inset with 3-0 Monocryl  interrupted, inverted deep dermal sutures and a running 3-0 Monocryl  subcuticular suture.  The flaps had bright red bleeding around the  periphery consistent with viability.  Steri-Strips, dry sterile dressing  lightly applied.  She was sent recovery room stable, having tolerated  the procedure well.      Etter Sjogren, M.D.  Electronically Signed     DB/MEDQ  D:  02/28/2008  T:  02/29/2008  Job:  [id number]

## 2011-05-12 NOTE — Discharge Summary (Signed)
Christy [name], Christy [name] NO.:  [id number]   MEDICAL RECORD NO.:  [id number]          PATIENT TYPE:  INP   LOCATION:  5739                         FACILITY:  [facility]   PHYSICIAN:  Etter Sjogren, M.D.     DATE OF BIRTH:  17-Jan-1956   DATE OF ADMISSION:  07/21/2007  DATE OF DISCHARGE:  07/23/2007                               DISCHARGE SUMMARY   FINAL DIAGNOSIS:  Breast cancer and acquired absence of both breasts.   PROCEDURES PERFORMED:  1. Bilateral mastectomy.  2. Right axillary node dissection with sentinel lymph node biopsy.  3. Removal of breast implants bilaterally.  4. Breast reconstruction bilaterally with tissue expanders.   SUMMARY OF H&P:  This is 55 year old woman who has had previous breast  cancer.  She has had radiation as well as lumpectomy and axillary  dissection.  She presents with recurrent cancer.  She is scheduled for  open bilateral mastectomy although autogenous reconstruction was  recommended to her using either a tram flap or latissimus flap, she  declined those and elected to have placement of tissue expanders.  She  understood there was extreme risk on the right side especially where she  has had previous radiation.  Nevertheless, she understood that there was  a good possibility of expander exposure and inability to expand with  loss of skin and wished to proceed.  For details of history and  physical, please see the chart.   COURSE IN THE HOSPITAL:  On admission, her urinalysis was within normal  limits.  Hemoglobin, hematocrit and white blood cell count also within  normal limits.  She was taken to surgery at which time the mastectomies  with axillary dissection, sentinel lymph node biopsy and breast  reconstruction with tissue expanders and removal of implants was  performed.  She tolerated the procedure well.  Postoperatively, she did  well.  She was afebrile.  The mastectomy flaps looked good on the left.  On the right side there was  a small area of  epidermolysis but this  has  not enlarged for the past 24 hours and has remained stable.  Drains are  functioning.  No incidence of any hematoma or infection.  She is ready  to be discharged and she would like to go home today.   DISCHARGE INSTRUCTIONS:  No raising her arms over her head, no lifting,  no shower.  Remove the drains 3 times a day and record amounts.  May use  the spirometer at least 5 times a day.  See her back in the office at  the end of the second week if the drains have slowed or early the  following week if they have not or if she any questions or concerns.  She already has her prescriptions of Percocet, a grand total of 1-2 p.o.  q. 4-6 h.p.r.n. pain.  Robaxin 5 mg 1 p.o. q.12 and Keflex 500 mg p.o.  q.i.d.      Etter Sjogren, M.D.  Electronically Signed     DB/MEDQ  D:  07/23/2007  T:  07/23/2007  Job:  [id number]

## 2011-05-15 NOTE — Procedures (Signed)
[facility]  Patient:    Christy [name], Christy [name]                      MRN: [id number] Proc. Date: 06/26/00 Adm. Date:  [id number] Attending:  Kelvin Cellar CC:         Verl Dicker, M.D.             Lennis P. Darrold Span, M.D.                           Procedure Report  PREOPERATIVE DIAGNOSES:  Right upper ureteral calculus and right pyelonephritis.  POSTOPERATIVE DIAGNOSES:  Right upper ureteral calculus and right pyelonephritis.  PROCEDURE:  Cystoscopy, right retrograde pyelogram with interpretation and right double-J catheter insertion.  SURGEON:  Maretta Bees. Vonita Moss, M.D.  ANESTHESIA:  General.  INDICATION:  This is a 55 year old white female who has had a past history of stones and remote history of pyelonephritis.  She has had a few-day history of right flank pain, fever and she has been on Tequin and KUB today showed either one very faint stone or a couple of small stones in the vicinity of the right UPJ.  Because of the very high location of the stones, the flank pain and fever, I felt that a cystoscopy and double-J catheter were appropriate and the patient agreed.  DESCRIPTION OF PROCEDURE:  The patient was brought to the operating room and placed in lithotomy position.  External genitalia were prepped and draped in usual fashion.  She was cystoscoped.  The bladder was unremarkable.  I passed a guidewire up into the renal pelvis without difficulty; I then performed a left retrograde pyelogram which showed pyelocaliectasis.  With the guidewire back in place in the renal pelvis, a 6-French 26-cm double-J catheter was easily passed over the guidewire and a full coil was placed in the right renal pelvic and a full coil in the bladder.  The string was removed and the patient was aware of this.  She knows she will have to have cystoscopic removal of the stent.  The bladder was emptied, the scope removed and patient sent to recovery room in good  condition.  She will continue on Tequin and she will follow up with Dr. Verl Dicker early next week. DD:  06/26/00 TD:  06/27/00 Job: [id number]

## 2011-05-20 ENCOUNTER — Telehealth: Payer: Self-pay | Admitting: Family Medicine

## 2011-05-20 NOTE — Telephone Encounter (Signed)
Vyvanse prescriptions were written and documented in her paper chart.

## 2011-08-25 ENCOUNTER — Other Ambulatory Visit: Payer: Self-pay | Admitting: Oncology

## 2011-08-25 ENCOUNTER — Encounter (HOSPITAL_BASED_OUTPATIENT_CLINIC_OR_DEPARTMENT_OTHER): Payer: Commercial Managed Care - PPO | Admitting: Oncology

## 2011-08-25 DIAGNOSIS — M949 Disorder of cartilage, unspecified: Secondary | ICD-10-CM

## 2011-08-25 DIAGNOSIS — Z23 Encounter for immunization: Secondary | ICD-10-CM

## 2011-08-25 DIAGNOSIS — M899 Disorder of bone, unspecified: Secondary | ICD-10-CM

## 2011-08-25 DIAGNOSIS — C50419 Malignant neoplasm of upper-outer quadrant of unspecified female breast: Secondary | ICD-10-CM

## 2011-08-25 LAB — CBC WITH DIFFERENTIAL/PLATELET
Eosinophils Absolute: 0.2 10*3/uL (ref 0.0–0.5)
MONO#: 0.4 10*3/uL (ref 0.1–0.9)
NEUT#: 2.9 10*3/uL (ref 1.5–6.5)
RBC: 4.67 10*6/uL (ref 3.70–5.45)
RDW: 13.6 % (ref 11.2–14.5)
WBC: 5 10*3/uL (ref 3.9–10.3)
lymph#: 1.4 10*3/uL (ref 0.9–3.3)

## 2011-08-25 LAB — VITAMIN D 25 HYDROXY (VIT D DEFICIENCY, FRACTURES): Vit D, 25-Hydroxy: 52 ng/mL (ref 30–89)

## 2011-08-25 LAB — COMPREHENSIVE METABOLIC PANEL
ALT: 22 U/L (ref 0–35)
AST: 22 U/L (ref 0–37)
Creatinine, Ser: 0.59 mg/dL (ref 0.50–1.10)
Total Bilirubin: 0.4 mg/dL (ref 0.3–1.2)

## 2011-09-18 LAB — CBC
HCT: 37
Hemoglobin: 12.1
MCV: 82.6
Platelets: 182
WBC: 4.7

## 2011-09-18 LAB — BASIC METABOLIC PANEL
BUN: 6
Chloride: 106
Potassium: 4.2

## 2011-09-21 LAB — URINALYSIS, ROUTINE W REFLEX MICROSCOPIC
Bilirubin Urine: NEGATIVE
Hgb urine dipstick: NEGATIVE
Protein, ur: NEGATIVE
Urobilinogen, UA: 0.2

## 2011-09-21 LAB — URINE CULTURE

## 2011-09-21 LAB — HEMOGLOBIN AND HEMATOCRIT, BLOOD
HCT: 25.8 — ABNORMAL LOW
Hemoglobin: 8.7 — ABNORMAL LOW

## 2011-09-28 ENCOUNTER — Telehealth: Payer: Self-pay | Admitting: Family Medicine

## 2011-09-28 MED ORDER — VENLAFAXINE HCL ER 150 MG PO CP24: 150.0000 mg | ORAL_CAPSULE | Freq: Every day | ORAL | Status: DC

## 2011-09-28 NOTE — Telephone Encounter (Signed)
Sent med in also added text for pt to see you before end of year

## 2011-09-28 NOTE — Telephone Encounter (Signed)
Go ahead and renew this and have her scheduled to see me before the end of the year

## 2011-09-28 NOTE — Telephone Encounter (Signed)
Ordered med per [employer]

## 2011-10-12 LAB — DIFFERENTIAL
Basophils Absolute: 0.1
Basophils Relative: 1
Eosinophils Absolute: 0.3
Eosinophils Relative: 7 — ABNORMAL HIGH
Monocytes Absolute: 0.4
Neutro Abs: 2.4

## 2011-10-12 LAB — COMPREHENSIVE METABOLIC PANEL
ALT: 44 — ABNORMAL HIGH
Albumin: 3.4 — ABNORMAL LOW
Alkaline Phosphatase: 92
BUN: 6
Chloride: 105
Potassium: 3.9
Sodium: 140
Total Bilirubin: 0.4

## 2011-10-12 LAB — URINALYSIS, ROUTINE W REFLEX MICROSCOPIC
Bilirubin Urine: NEGATIVE
Ketones, ur: NEGATIVE
Nitrite: NEGATIVE
Protein, ur: NEGATIVE
Urobilinogen, UA: 1

## 2011-10-12 LAB — CBC
HCT: 35 — ABNORMAL LOW
Hemoglobin: 12
Platelets: 173
WBC: 4.8

## 2011-10-13 LAB — CBC
Hemoglobin: 11.7 — ABNORMAL LOW
MCHC: 34.6
MCV: 88.6
RBC: 3.81 — ABNORMAL LOW
WBC: 6.2

## 2011-10-14 LAB — COMPREHENSIVE METABOLIC PANEL
AST: 23
Albumin: 3.5
BUN: 6
CO2: 33 — ABNORMAL HIGH
Calcium: 9.1
Creatinine, Ser: 0.75
GFR calc Af Amer: >60
GFR calc non Af Amer: >60
Total Bilirubin: 0.3

## 2011-10-14 LAB — CBC
HCT: 41.8
MCHC: 32.9
MCV: 90.6
Platelets: 184

## 2011-10-14 LAB — PROTIME-INR: INR: 0.9

## 2011-10-14 LAB — APTT: aPTT: 28

## 2011-11-30 ENCOUNTER — Encounter: Payer: Self-pay | Admitting: Internal Medicine

## 2011-11-30 ENCOUNTER — Other Ambulatory Visit: Payer: Self-pay | Admitting: Family Medicine

## 2011-11-30 ENCOUNTER — Telehealth: Payer: Self-pay | Admitting: Family Medicine

## 2011-11-30 MED ORDER — LISDEXAMFETAMINE DIMESYLATE 60 MG PO CAPS: 60.0000 mg | ORAL_CAPSULE | ORAL | Status: DC

## 2011-11-30 NOTE — Telephone Encounter (Signed)
Called pt to inform her left message to call back she needs appt Dr.L will cover her till her appt but not 3 mths

## 2011-11-30 NOTE — Telephone Encounter (Signed)
She needs a followup appointment but not today

## 2011-12-01 ENCOUNTER — Encounter: Payer: Self-pay | Admitting: Family Medicine

## 2011-12-01 ENCOUNTER — Ambulatory Visit (INDEPENDENT_AMBULATORY_CARE_PROVIDER_SITE_OTHER): Payer: Commercial Managed Care - PPO | Admitting: Family Medicine

## 2011-12-01 DIAGNOSIS — Z79899 Other long term (current) drug therapy: Secondary | ICD-10-CM

## 2011-12-01 DIAGNOSIS — F909 Attention-deficit hyperactivity disorder, unspecified type: Secondary | ICD-10-CM

## 2011-12-01 DIAGNOSIS — Z853 Personal history of malignant neoplasm of breast: Secondary | ICD-10-CM

## 2011-12-01 DIAGNOSIS — IMO0002 Reserved for concepts with insufficient information to code with codable children: Secondary | ICD-10-CM

## 2011-12-01 DIAGNOSIS — Z9884 Bariatric surgery status: Secondary | ICD-10-CM

## 2011-12-01 DIAGNOSIS — E039 Hypothyroidism, unspecified: Secondary | ICD-10-CM

## 2011-12-01 LAB — LIPID PANEL
HDL: 49 mg/dL (ref >39)
LDL Cholesterol: 90 mg/dL (ref 0–99)

## 2011-12-01 LAB — TSH: TSH: 2.132 u[IU]/mL (ref 0.350–4.500)

## 2011-12-01 MED ORDER — LISDEXAMFETAMINE DIMESYLATE 60 MG PO CAPS: 60.0000 mg | ORAL_CAPSULE | ORAL | Status: DC

## 2011-12-01 NOTE — Progress Notes (Signed)
  Subjective:    Patient ID: Christy [name], female    DOB: 02-29-1956, 55 y.o.   MRN: [id number]  HPI She is here for an interval evaluation. Since being placed on thyroid medication she has noticed a definite increase in her overall energy level as well as a decrease in her myalgias. She has a history of gastric bypass and subsequently developed a seroma from this. It apparently had been drained in the past and she has concerns over this. She has a history of ADD and presently is quite stable on Vyvanse although at the end of her 12 hour day she does tend to note decreased ability to function. She continues on Effexor which has helped with postchemotherapy hot flashes. She has tried tapering off this in the past but was not successful and would like to continue on this. In May she will be 5 years post her second course of breast cancer.   Review of Systems     Objective:   Physical Exam Alert and in no distress. Exam of the abdomen does show a fullness in the right upper cautery but no distinct mass.       Assessment & Plan:   1. Hypothyroid  TSH, Lipid panel  2. Encounter for long-term (current) use of other medications  TSH, Lipid panel  3. ADD (attention deficit disorder with hyperactivity)    4. History of breast cancer    5. History of gastric bypass    6. Seroma complicating a procedure     I will check her lipids and thyroid function study. Her Vyvanse was renewed.

## 2011-12-23 ENCOUNTER — Other Ambulatory Visit: Payer: Self-pay | Admitting: Family Medicine

## 2011-12-24 NOTE — Telephone Encounter (Signed)
Is this ok?

## 2012-01-19 ENCOUNTER — Telehealth: Payer: Self-pay | Admitting: Family Medicine

## 2012-01-19 NOTE — Telephone Encounter (Signed)
DONE

## 2012-01-30 ENCOUNTER — Telehealth: Payer: Self-pay | Admitting: Oncology

## 2012-01-30 NOTE — Telephone Encounter (Signed)
S/w pt re appt for 2/25.

## 2012-01-31 ENCOUNTER — Other Ambulatory Visit: Payer: Self-pay | Admitting: Oncology

## 2012-01-31 DIAGNOSIS — C50119 Malignant neoplasm of central portion of unspecified female breast: Secondary | ICD-10-CM

## 2012-02-22 ENCOUNTER — Other Ambulatory Visit: Payer: Commercial Managed Care - PPO | Admitting: Lab

## 2012-02-22 ENCOUNTER — Ambulatory Visit: Payer: Commercial Managed Care - PPO | Admitting: Oncology

## 2012-03-04 ENCOUNTER — Telehealth: Payer: Self-pay | Admitting: Oncology

## 2012-03-04 ENCOUNTER — Other Ambulatory Visit: Payer: Self-pay

## 2012-03-04 ENCOUNTER — Telehealth: Payer: Self-pay

## 2012-03-04 ENCOUNTER — Ambulatory Visit (HOSPITAL_BASED_OUTPATIENT_CLINIC_OR_DEPARTMENT_OTHER): Payer: Commercial Managed Care - PPO | Admitting: Oncology

## 2012-03-04 ENCOUNTER — Encounter: Payer: Self-pay | Admitting: Oncology

## 2012-03-04 ENCOUNTER — Other Ambulatory Visit (HOSPITAL_BASED_OUTPATIENT_CLINIC_OR_DEPARTMENT_OTHER): Payer: Commercial Managed Care - PPO | Admitting: Lab

## 2012-03-04 VITALS — BP 144/86 | HR 108 | Temp 97.8°F | Ht 66.5 in | Wt 172.2 lb

## 2012-03-04 DIAGNOSIS — Z9884 Bariatric surgery status: Secondary | ICD-10-CM

## 2012-03-04 DIAGNOSIS — M8580 Other specified disorders of bone density and structure, unspecified site: Secondary | ICD-10-CM | POA: Insufficient documentation

## 2012-03-04 DIAGNOSIS — C50919 Malignant neoplasm of unspecified site of unspecified female breast: Secondary | ICD-10-CM

## 2012-03-04 DIAGNOSIS — M949 Disorder of cartilage, unspecified: Secondary | ICD-10-CM

## 2012-03-04 DIAGNOSIS — C50411 Malignant neoplasm of upper-outer quadrant of right female breast: Secondary | ICD-10-CM | POA: Insufficient documentation

## 2012-03-04 DIAGNOSIS — C50419 Malignant neoplasm of upper-outer quadrant of unspecified female breast: Secondary | ICD-10-CM

## 2012-03-04 DIAGNOSIS — M899 Disorder of bone, unspecified: Secondary | ICD-10-CM

## 2012-03-04 DIAGNOSIS — Z170 Estrogen receptor positive status [ER+]: Secondary | ICD-10-CM

## 2012-03-04 LAB — COMPREHENSIVE METABOLIC PANEL
ALT: 26 U/L (ref 0–35)
BUN: 9 mg/dL (ref 6–23)
CO2: 27 mEq/L (ref 19–32)
Calcium: 9.3 mg/dL (ref 8.4–10.5)
Chloride: 107 mEq/L (ref 96–112)
Creatinine, Ser: 0.66 mg/dL (ref 0.50–1.10)
Glucose, Bld: 112 mg/dL — ABNORMAL HIGH (ref 70–99)
Total Bilirubin: 0.5 mg/dL (ref 0.3–1.2)

## 2012-03-04 LAB — CBC WITH DIFFERENTIAL/PLATELET
Basophils Absolute: 0.1 10*3/uL (ref 0.0–0.1)
Eosinophils Absolute: 0.2 10*3/uL (ref 0.0–0.5)
HCT: 40.4 % (ref 34.8–46.6)
HGB: 13.6 g/dL (ref 11.6–15.9)
LYMPH%: 22 % (ref 14.0–49.7)
MCHC: 33.8 g/dL (ref 31.5–36.0)
MONO#: 0.4 10*3/uL (ref 0.1–0.9)
NEUT%: 66.6 % (ref 38.4–76.8)
Platelets: 161 10*3/uL (ref 145–400)
WBC: 6 10*3/uL (ref 3.9–10.3)
lymph#: 1.3 10*3/uL (ref 0.9–3.3)

## 2012-03-04 MED ORDER — CYCLOBENZAPRINE HCL 10 MG PO TABS: ORAL_TABLET | ORAL | Status: DC

## 2012-03-04 NOTE — Telephone Encounter (Signed)
gv pt appt schedule for oct and mammo for sept.

## 2012-03-04 NOTE — Progress Notes (Signed)
OFFICE PROGRESS NOTE Date of Visit 03-04-2012 Physicians ChristyCarlson, ChristyCarlson, (ChristyCarlson)  INTERVAL HISTORY:   Patient is seen, alone for visit, in scheduled 6 month follow up of her breast cancer history, continuing Femara. History is of 2 primary right breast cancers, in 1993 and May 2008. The initial right breast cancer was stage 2 with 3 of 41 nodes involved, ER/PR negative and HER-2 not done in 1993. She was treated by Dr.John Durward [name] on NSABP B25 with adriamycin/cytoxan. The most recent breast cancer was 0.6 cm invasive ductal with 3 nodes negative, ER/PR + and HER2 -. She had right mastectomy and prophylactic left mastectomy, with bilateral reconstructions. She additionally had hysterectomy with BSO. She has been on Femara since Aug 2008, which she is tolerating well. She has not had bone density scan repeated, last Sept 2011 with osteopenia then. She gets calcium supplement in 3 MVIs daily. She understands that she will need to have bone density scan done at least this fall. With work schedule and knee problems, she is not exercising regularly.  Christy [name] was begun on thyroid supplement by Dr.Lalonde, which has helped energy and also some of the arthralgia symptoms that had been attributed to Femara. She has not had medical changes otherwise, is considering right knee surgery by Christy [name]. On Review of Systems otherwise: still lots of stress at work with [employer]. No recent infectious illnesses. No respiratory, cardiac, GI, hematologic, other musculoskeletal complaints. Remainder of 10 point Review of Systems negative.  Her older son is to be married in June. Younger son Christy [name] with juvenile diabetes doing well. Objective:  Vital signs in last 24 hours:  BP 144/86  Pulse 108  Temp(Src) 97.8 F (36.6 C) (Oral)  Ht 5' 6.5" (1.689 m)  Wt 172 lb 3.2 oz (78.109 kg)  BMI 27.38 kg/m2 This weight is down 6 lbs from Aug 2012.  Easily mobile, looks comfortable.  HEENT:mucous membranes moist,  pharynx normal without lesions. PERRL.  LymphaticsCervical, supraclavicular, and axillary nodes normal. Resp: clear to auscultation bilaterally and normal percussion bilaterally Cardio: regular rate and rhythm GI: soft, non-tender; bowel sounds normal; no masses,  no organomegaly Extremities: extremities normal, atraumatic, no cyanosis or edema Neuro nonfocal Right reconstructed breast without evidence of local recurrence. Left reconstructed breast without findings of concern. Nothing in either axilla, no swelling either upper extremity. Skin not remarkable Lab Results:   [facility] 03/04/12 0949  WBC 6.0  HGB 13.6  HCT 40.4  PLT 161  ANC 4.0  BMET  Basename 03/04/12 0949  NA 141  K 4.3  CL 107  CO2 27  GLUCOSE 112*  BUN 9  CREATININE 0.66  CALCIUM 9.3  Remainder of CMET normal  Studies/Results:  No results found.  Medications: I have reviewed the patient's current medications.  Assessment/Plan:  1.History of 2 primary right breast cancer as above: continuing Femara. Next appointment after bone density scan ~ Sept. 2.Osteopenia preceeding aromatase inhibitor: repeat bone density at least Sept 2013 3.Gastric bypass 2003  4.hysterectomy with oophorectomy 5.hypothyroidism on replacement 6.orthopedic problems with knee 7.prophylactic left mastectomy, bilateral breast reconstructions        Christy [name] P, MD   03/04/2012, 9:09 PM

## 2012-03-04 NOTE — Telephone Encounter (Signed)
SPOKE WITH MS. Mareno AND TOLD HER THAT DR. Darrold Span SAID THAT HER CHEMISTRIES FROM TODAY WERE ALL FINE.

## 2012-03-04 NOTE — Patient Instructions (Signed)
Bone density scan needed this fall due to Femara (last 08-2010)

## 2012-03-06 DIAGNOSIS — Z9884 Bariatric surgery status: Secondary | ICD-10-CM | POA: Insufficient documentation

## 2012-03-10 ENCOUNTER — Encounter: Payer: Self-pay | Admitting: Internal Medicine

## 2012-03-30 ENCOUNTER — Telehealth: Payer: Self-pay | Admitting: Family Medicine

## 2012-03-30 ENCOUNTER — Encounter: Payer: Self-pay | Admitting: Family Medicine

## 2012-03-30 MED ORDER — LISDEXAMFETAMINE DIMESYLATE 60 MG PO CAPS: 60.0000 mg | ORAL_CAPSULE | ORAL | Status: DC

## 2012-03-30 NOTE — Progress Notes (Signed)
  Subjective:    Patient ID: Christy [name], female    DOB: Sep 21, 1956, 56 y.o.   MRN: [id number]  HPI    Review of Systems     Objective:   Physical Exam        Assessment & Plan:  The Vyvanse prescriptions were written to start on the eighth of the month which was an error in the computer system. I will rewrite them to start on April 3.

## 2012-03-30 NOTE — Telephone Encounter (Signed)
Vyvanse called in.

## 2012-04-06 ENCOUNTER — Other Ambulatory Visit: Payer: Self-pay | Admitting: Family Medicine

## 2012-07-14 ENCOUNTER — Telehealth: Payer: Self-pay | Admitting: Family Medicine

## 2012-07-14 MED ORDER — LISDEXAMFETAMINE DIMESYLATE 60 MG PO CAPS: 60.0000 mg | ORAL_CAPSULE | ORAL | Status: DC

## 2012-07-14 NOTE — Telephone Encounter (Signed)
Vyvanse renewed 

## 2012-09-19 ENCOUNTER — Other Ambulatory Visit: Payer: Commercial Managed Care - PPO

## 2012-09-20 ENCOUNTER — Ambulatory Visit
Admission: RE | Admit: 2012-09-20 | Discharge: 2012-09-20 | Disposition: A | Discharge status: Home / Self Care | Payer: 59 | Source: Ambulatory Visit | Attending: Oncology | Admitting: Oncology

## 2012-09-20 DIAGNOSIS — M8580 Other specified disorders of bone density and structure, unspecified site: Secondary | ICD-10-CM

## 2012-09-26 ENCOUNTER — Other Ambulatory Visit: Payer: Commercial Managed Care - PPO

## 2012-09-28 ENCOUNTER — Telehealth: Payer: Self-pay | Admitting: Family Medicine

## 2012-09-30 ENCOUNTER — Ambulatory Visit (HOSPITAL_BASED_OUTPATIENT_CLINIC_OR_DEPARTMENT_OTHER): Payer: 59 | Admitting: Oncology

## 2012-09-30 ENCOUNTER — Encounter: Payer: Self-pay | Admitting: Oncology

## 2012-09-30 ENCOUNTER — Other Ambulatory Visit (HOSPITAL_BASED_OUTPATIENT_CLINIC_OR_DEPARTMENT_OTHER): Payer: 59

## 2012-09-30 ENCOUNTER — Other Ambulatory Visit: Payer: Self-pay | Admitting: Medical Oncology

## 2012-09-30 ENCOUNTER — Telehealth: Payer: Self-pay | Admitting: Oncology

## 2012-09-30 VITALS — BP 146/87 | HR 93 | Temp 98.8°F | Resp 16 | Ht 66.0 in | Wt 179.9 lb

## 2012-09-30 DIAGNOSIS — C50919 Malignant neoplasm of unspecified site of unspecified female breast: Secondary | ICD-10-CM

## 2012-09-30 DIAGNOSIS — C50419 Malignant neoplasm of upper-outer quadrant of unspecified female breast: Secondary | ICD-10-CM

## 2012-09-30 DIAGNOSIS — M62838 Other muscle spasm: Secondary | ICD-10-CM

## 2012-09-30 DIAGNOSIS — M949 Disorder of cartilage, unspecified: Secondary | ICD-10-CM

## 2012-09-30 DIAGNOSIS — Z853 Personal history of malignant neoplasm of breast: Secondary | ICD-10-CM

## 2012-09-30 DIAGNOSIS — M899 Disorder of bone, unspecified: Secondary | ICD-10-CM

## 2012-09-30 DIAGNOSIS — B379 Candidiasis, unspecified: Secondary | ICD-10-CM

## 2012-09-30 LAB — CBC WITH DIFFERENTIAL/PLATELET
Basophils Absolute: 0 10*3/uL (ref 0.0–0.1)
Eosinophils Absolute: 0.3 10*3/uL (ref 0.0–0.5)
HGB: 12.6 g/dL (ref 11.6–15.9)
MCV: 83.5 fL (ref 79.5–101.0)
MONO#: 0.4 10*3/uL (ref 0.1–0.9)
MONO%: 10.2 % (ref 0.0–14.0)
NEUT#: 1.9 10*3/uL (ref 1.5–6.5)
RBC: 4.61 10*6/uL (ref 3.70–5.45)
RDW: 14.3 % (ref 11.2–14.5)
WBC: 4 10*3/uL (ref 3.9–10.3)
lymph#: 1.3 10*3/uL (ref 0.9–3.3)

## 2012-09-30 LAB — COMPREHENSIVE METABOLIC PANEL (CC13)
Albumin: 3.5 g/dL (ref 3.5–5.0)
Alkaline Phosphatase: 122 U/L (ref 40–150)
Calcium: 8.9 mg/dL (ref 8.4–10.4)
Chloride: 106 mEq/L (ref 98–107)
Glucose: 98 mg/dl (ref 70–99)
Potassium: 4 mEq/L (ref 3.5–5.1)
Sodium: 140 mEq/L (ref 136–145)
Total Protein: 6.2 g/dL — ABNORMAL LOW (ref 6.4–8.3)

## 2012-09-30 MED ORDER — CYCLOBENZAPRINE HCL 10 MG PO TABS: ORAL_TABLET | ORAL | Status: DC

## 2012-09-30 MED ORDER — FLUCONAZOLE 100 MG PO TABS: 100.0000 mg | ORAL_TABLET | Freq: Every day | ORAL | Status: DC

## 2012-09-30 NOTE — Telephone Encounter (Signed)
Called in 2  rx to [facility]

## 2012-09-30 NOTE — Telephone Encounter (Signed)
Gave pt appt for April 2014 lab and MD °

## 2012-09-30 NOTE — Progress Notes (Signed)
OFFICE PROGRESS NOTE   09/30/2012   Physicians:J.Lalonde, C.Streck, (J.Tomblin), V.Paul   INTERVAL HISTORY:  Patient is seen, alone for visit, in 6 month follow up of her history of two primary right breast cancers, on Femara since Aug 2008. She had repeat bone density scan 09-20-12.  History is of 2 primary right breast cancers, in 1993 and May 2008. The initial right breast cancer was stage 2 with 3 of 41 nodes involved, ER/PR negative and HER-2 not done in 1993. She was treated by Dr.John Durward Fortes on NSABP B25 with adriamycin/cytoxan. The most recent breast cancer was 0.6 cm invasive ductal with 3 nodes negative, ER/PR + and HER2 -. She had right mastectomy and prophylactic left mastectomy, with bilateral reconstructions. She additionally had hysterectomy with BSO. She has been on Femara since Aug 2008, which she is tolerating well. The bone density scan Sept 2013 shows osteopenia, with T score in LS -1.7 and femoral neck -1.9; this is unchanged in hip and increased 3% in LS spine compared with 08-2010, tho is it down 11.5% in spine and 9% in hip compared with baseline bone density in 2006. She is on calcium and Vitamin D, is not getting any regular exercise primarily due to limitations from knee.  Patient has no complaints that seem concerning from standpoint of breast cancer history or that treatment. She has some fall environmental allergies, without recent infectious illness. She denies respiratory or GI symptoms, no bleeding or bruising. She has no new or different pain. Energy seems at baseline given continued job stress, with loss of staffing.She cannot have needed knee replacement due to low bone density locally, followed by Dr Renae Fickle. Remainder of 10 point Review of Systems negative.  Dr.Paul mentioned IV bisphosphonate, which she will discuss with Dr Susann Givens. We discussed water exercise, which she does not feel is possible due to time constraints from work.   Her older son was married this  summer. He is now in EMT training. Objective:  Vital signs in last 24 hours:  BP 146/87  Pulse 93  Temp 98.8 F (37.1 C) (Oral)  Resp 16  Ht 5\' 6"  (1.676 m)  Wt 179 lb 14.4 oz (81.602 kg)  BMI 29.04 kg/m2 Ambulatory without assistance, NAD. Weight is up 8 lbs.   HEENT:PERRLA, sclera clear, anicteric and oropharynx clear, no lesions LymphaticsCervical, supraclavicular, and axillary nodes normal. Resp: clear to auscultation bilaterally and normal percussion bilaterally Cardio: regular rate and rhythm GI: soft, non-tender; bowel sounds normal; no masses,  no organomegaly Extremities: extremities normal, atraumatic, no cyanosis or edema No swelling UE. Neuro:nonfocal Breasts: bilateral TRAMS without concerns for locally recurrent cancer on right or changes of concern on left.  Skin without bruises or petechiae.  Lab Results:  Results for orders placed in visit on 09/30/12  CBC WITH DIFFERENTIAL      Component Value Range   WBC 4.0  3.9 - 10.3 10e3/uL   NEUT# 1.9  1.5 - 6.5 10e3/uL   HGB 12.6  11.6 - 15.9 g/dL   HCT 16.1  09.6 - 04.5 %   Platelets 137 (*) 145 - 400 10e3/uL   MCV 83.5  79.5 - 101.0 fL   MCH 27.4  25.1 - 34.0 pg   MCHC 32.8  31.5 - 36.0 g/dL   RBC 4.09  8.11 - 9.14 10e6/uL   RDW 14.3  11.2 - 14.5 %   lymph# 1.3  0.9 - 3.3 10e3/uL   MONO# 0.4  0.1 - 0.9 10e3/uL  Eosinophils Absolute 0.3  0.0 - 0.5 10e3/uL   Basophils Absolute 0.0  0.0 - 0.1 10e3/uL   NEUT% 47.1  38.4 - 76.8 %   LYMPH% 33.7  14.0 - 49.7 %   MONO% 10.2  0.0 - 14.0 %   EOS% 7.8 (*) 0.0 - 7.0 %   BASO% 1.2  0.0 - 2.0 %  COMPREHENSIVE METABOLIC PANEL (CC13)      Component Value Range   Sodium 140  136 - 145 mEq/L   Potassium 4.0  3.5 - 5.1 mEq/L   Chloride 106  98 - 107 mEq/L   CO2 25  22 - 29 mEq/L   Glucose 98  70 - 99 mg/dl   BUN 16.1  7.0 - 09.6 mg/dL   Creatinine 0.6  0.6 - 1.1 mg/dL   Total Bilirubin 0.45  0.20 - 1.20 mg/dL   Alkaline Phosphatase 122  40 - 150 U/L   AST 23  5 -  34 U/L   ALT 25  0 - 55 U/L   Total Protein 6.2 (*) 6.4 - 8.3 g/dL   Albumin 3.5  3.5 - 5.0 g/dL   Calcium 8.9  8.4 - 40.9 mg/dL    Platelets above are slightly down from usual 150 - 160 and eos up likely with allergy symptoms.  Studies/Results: Bone Density Scan 09-20-12 [facility] DUAL X-RAY ABSORPTIOMETRY (DXA) FOR BONE MINERAL DENSITY  AP LUMBAR SPINE (L1, L3, L4)  Bone Mineral Density (BMD): 0.868 g/cm2  Young Adult T Score: -1.7  Z Score: -0.5  LEFT FEMUR (NECK)  Bone Mineral Density (BMD): 0.634 g/cm2  Young Adult T Score: -1.9  Z Score: -0.8  ASSESSMENT: Patient's diagnostic category is LOW BONE MASS by WHO  Criteria.  FRACTURE RISK: MODERATE  FRAX: Based on the World Health Organization FRAX model, the 10  year probability of a major osteoporotic fracture is 7.8%. The 10  year probability of a hip fracture is 0.9%.  Comparison: There has been a statistically significant 11.5%  decrease in BMD in the spine and a statistically significant 9.0%  decrease in BMD in the total left hip as compared to 03/27/2005.  There has been a statistically significant 3% increase in BMD in  the spine and no significant change in BMD in the total left hip as  compared to 09/26/2010.   Medications: I have reviewed the patient's current medications. Last Vit D in our records was normal at 52 in 07-2011; we will need to confirm Vit D dose listed in this EMR, as this may be more than necessary now.  We have discussed recent information concerning benefit from hormonal blockade used > 5 years. She does want to continue Femara.  Assessment/Plan:  1.History of 2 primary right breast cancer as above: continuing Femara.  I will see her back in 6 months or sooner if needed. 2.Osteopenia preceeding aromatase inhibitor: repeat bone density some improvement compared with 2011 tho still osteopenic. Risk factors include ongoing aromatase inhibitor, gastric bypass, lack of exercise. She will discuss  bisphosphonate with PCP. I will recheck Vit D level with other labs in 6 months if not done prior.  3.Gastric bypass 2003  4.hysterectomy with oophorectomy  5.hypothyroidism on replacement  6.orthopedic problems with knee  7.prophylactic left mastectomy, bilateral breast reconstructions   Areatha Kalata P, MD   09/30/2012, 12:03 PM

## 2012-09-30 NOTE — Patient Instructions (Signed)
Follow up with Dr Susann Givens about IV bisphosphonate for osteopenia

## 2012-10-01 ENCOUNTER — Other Ambulatory Visit: Payer: Self-pay | Admitting: Oncology

## 2012-10-01 DIAGNOSIS — M8580 Other specified disorders of bone density and structure, unspecified site: Secondary | ICD-10-CM

## 2012-10-01 DIAGNOSIS — C50919 Malignant neoplasm of unspecified site of unspecified female breast: Secondary | ICD-10-CM

## 2012-10-03 ENCOUNTER — Telehealth: Payer: Self-pay

## 2012-10-03 NOTE — Telephone Encounter (Signed)
Told Ms. Bugarin that her  c-met was fine per Dr. Darrold Span.  Her total protein was slightly low at 6.2.   Dr. Darrold Span said that her Vit D level in 2012 was very good at 18.  Her med list 5000 units twice a day.  Dr. Darrold Span suggests that she take [phone] units daily.  There probably some vit. D in her MVI. Ms. Sandino stated that she was taking the 5000 units  Due to gastric bypass.  She will  Begin  2000 units now.   Dr. Darrold Span can repeat the Vit. Dlevel here in 6 months if not checked prior to that visit.  Pt. Verbalized understanding.

## 2012-10-04 NOTE — Telephone Encounter (Signed)
TSD  

## 2012-11-04 ENCOUNTER — Telehealth: Payer: Self-pay | Admitting: Family Medicine

## 2012-11-04 ENCOUNTER — Telehealth: Payer: Self-pay | Admitting: Internal Medicine

## 2012-11-04 MED ORDER — LISDEXAMFETAMINE DIMESYLATE 60 MG PO CAPS: 60.0000 mg | ORAL_CAPSULE | ORAL | Status: DC

## 2012-11-04 NOTE — Telephone Encounter (Signed)
Vyvanse renewed; patient needs an appointment to

## 2012-11-04 NOTE — Telephone Encounter (Signed)
LM

## 2012-11-08 ENCOUNTER — Ambulatory Visit (INDEPENDENT_AMBULATORY_CARE_PROVIDER_SITE_OTHER): Payer: 59 | Admitting: Family Medicine

## 2012-11-08 ENCOUNTER — Encounter: Payer: Self-pay | Admitting: Family Medicine

## 2012-11-08 VITALS — BP 130/90 | HR 84 | Wt 180.0 lb

## 2012-11-08 DIAGNOSIS — F988 Other specified behavioral and emotional disorders with onset usually occurring in childhood and adolescence: Secondary | ICD-10-CM

## 2012-11-08 DIAGNOSIS — C50919 Malignant neoplasm of unspecified site of unspecified female breast: Secondary | ICD-10-CM

## 2012-11-08 MED ORDER — LISDEXAMFETAMINE DIMESYLATE 60 MG PO CAPS: 60.0000 mg | ORAL_CAPSULE | ORAL | Status: DC

## 2012-11-08 NOTE — Progress Notes (Signed)
  Subjective:    Patient ID: Christy [name], female    DOB: 05-28-1956, 56 y.o.   MRN: [id number]  HPI She is here for followup on her ADD. She continues on Vyvanse and gets roughly 7-8 hours worth of benefit out of it however her workday lasts much longer than this. She notes that when she comes off of it she did get more hungry and as well as more distracted and unable to finish her tasks. Work has become quite stressful due to her heavy workload. She continues to be followed by her oncologist and is now 5 years from her most recent episode of breast cancer.   Review of Systems     Objective:   Physical Exam Alert and in no distress otherwise not examined       Assessment & Plan:   1. Breast cancer   2. ADD (attention deficit disorder)    I will continue her on Vyvanse. I will also give her Adderall 10 mg to see if this will help give her a little boost at the end of the day. She will let he know how this works.

## 2012-11-15 ENCOUNTER — Other Ambulatory Visit: Payer: Self-pay | Admitting: Oncology

## 2012-11-16 ENCOUNTER — Telehealth: Payer: Self-pay | Admitting: Family Medicine

## 2012-11-16 NOTE — Telephone Encounter (Signed)
LM

## 2012-12-13 ENCOUNTER — Other Ambulatory Visit: Payer: Self-pay | Admitting: Family Medicine

## 2012-12-13 NOTE — Telephone Encounter (Signed)
IS THIS OK 

## 2012-12-13 NOTE — Telephone Encounter (Signed)
She needs to come in for TSH. I can be as a nurse visit. Find out why she wants such a high amount of meds

## 2012-12-15 ENCOUNTER — Encounter: Payer: Self-pay | Admitting: Family Medicine

## 2012-12-15 ENCOUNTER — Ambulatory Visit (INDEPENDENT_AMBULATORY_CARE_PROVIDER_SITE_OTHER): Payer: 59 | Admitting: Family Medicine

## 2012-12-15 VITALS — BP 170/100 | Wt 179.0 lb

## 2012-12-15 DIAGNOSIS — K5900 Constipation, unspecified: Secondary | ICD-10-CM

## 2012-12-15 DIAGNOSIS — C50919 Malignant neoplasm of unspecified site of unspecified female breast: Secondary | ICD-10-CM

## 2012-12-15 DIAGNOSIS — R0989 Other specified symptoms and signs involving the circulatory and respiratory systems: Secondary | ICD-10-CM

## 2012-12-15 DIAGNOSIS — F988 Other specified behavioral and emotional disorders with onset usually occurring in childhood and adolescence: Secondary | ICD-10-CM

## 2012-12-15 DIAGNOSIS — R0609 Other forms of dyspnea: Secondary | ICD-10-CM

## 2012-12-15 DIAGNOSIS — E039 Hypothyroidism, unspecified: Secondary | ICD-10-CM

## 2012-12-15 LAB — LIPID PANEL
Cholesterol: 135 mg/dL (ref 0–200)
Triglycerides: 76 mg/dL (ref <150)

## 2012-12-15 NOTE — Progress Notes (Signed)
  Subjective:    Patient ID: Christy [name], female    DOB: November 29, 1956, 56 y.o.   MRN: [id number]  HPI She has a 2 month history of dyspnea on exertion. She states she can go up a flight of steps or carry groceries and becomes short of breath. The symptoms go way within several minutes. She has no associated chest pain, diaphoresis or palpitations. She has a previous history of breast cancer and did have Adriamycin. Family history significant for father having heart disease at age 76. She also needs followup on her thyroid medication. She does complain of constipation. She does have a ADD and is quite stable on Vyvanse. She also uses Adderall intermittently during the day to help stay focused.   Review of Systems     Objective:   Physical Exam Alert and in no distress. Cardiac exam shows regular rhythm without murmurs or gallops. Lungs are clear to auscultation. EKG shows no acute changes.       Assessment & Plan:   1. Dyspnea on exertion   2. Breast cancer   3. Constipation   4. Hypothyroid   5. ADD (attention deficit disorder)    I will check thyroid function as well as lipids.

## 2012-12-19 NOTE — Progress Notes (Signed)
Quick Note:  Called pt cell left message labs look good ______

## 2013-01-10 ENCOUNTER — Ambulatory Visit (INDEPENDENT_AMBULATORY_CARE_PROVIDER_SITE_OTHER): Payer: 59 | Admitting: Cardiology

## 2013-01-23 ENCOUNTER — Telehealth: Payer: Self-pay

## 2013-01-23 ENCOUNTER — Telehealth: Payer: Self-pay | Admitting: Internal Medicine

## 2013-01-23 MED ORDER — SULFAMETHOXAZOLE-TRIMETHOPRIM 800-160 MG PO TABS: 1.0000 | ORAL_TABLET | Freq: Two times a day (BID) | ORAL | Status: DC

## 2013-01-23 NOTE — Telephone Encounter (Signed)
Called pt said she would drop one off here she asked if you would send in azo also

## 2013-01-23 NOTE — Telephone Encounter (Signed)
See if she can at least do a dipstick urine somewhere

## 2013-01-23 NOTE — Telephone Encounter (Signed)
Pt started having dysuria and frequency last night during the night and it has gotten worst this morning and pt would like a 3 day antibiotic. 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 Send to [facility]

## 2013-01-23 NOTE — Telephone Encounter (Signed)
YES AND SHE WAS TOLD THIS

## 2013-01-23 NOTE — Telephone Encounter (Signed)
Send this in but I think you can get without a prescription

## 2013-02-01 ENCOUNTER — Ambulatory Visit (INDEPENDENT_AMBULATORY_CARE_PROVIDER_SITE_OTHER): Payer: 59 | Admitting: Cardiology

## 2013-02-01 ENCOUNTER — Encounter: Payer: Self-pay | Admitting: Cardiology

## 2013-02-01 VITALS — BP 150/94 | HR 101 | Ht 66.0 in | Wt 182.0 lb

## 2013-02-01 DIAGNOSIS — R0602 Shortness of breath: Secondary | ICD-10-CM

## 2013-02-01 DIAGNOSIS — R0600 Dyspnea, unspecified: Secondary | ICD-10-CM | POA: Insufficient documentation

## 2013-02-01 NOTE — Patient Instructions (Signed)
Try taking Celebrex as needed.  We will schedule you for an echocardiogram  Monitor your Blood pressure

## 2013-02-01 NOTE — Progress Notes (Signed)
Christy [name] Date of Birth: 04/08/1956 Medical Record #[id number]  History of Present Illness: Christy [name] is seen at the request of Dr. Susann Givens for evaluation of dyspnea. She is a very pleasant 57 year old white female who states that she has been short of breath with exertion for some time. 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 She does have a history of breast cancer and underwent lumpectomy and radiation and chemotherapy 20 years ago. This was with Adriamycin and Cytoxan. 5 years ago she had recurrent cancer and underwent double mastectomy with reconstruction. She did not receive any additional therapy at that time. She has a history of morbid obesity and is had previous gastric bypass surgery in 2004. She initially lost 85 pounds but has gained some weight back recently. She is in a high stress job. She did have prior cardiac evaluation with an echocardiogram in 2009 which was unremarkable. She denies any chest pain. She's had no increase in edema or orthopnea. She denies any cough. No palpitations. She has no history of hypertension, hypercholesterolemia, or diabetes. She is a nonsmoker. Her only family history of this of her father having heart disease in his 38s.  Current Outpatient Prescriptions on File Prior to Visit  Medication Sig Dispense Refill  . celecoxib (CELEBREX) 200 MG capsule Take 200 mg by mouth daily.      . Cholecalciferol 5000 UNITS capsule Take 2,000 Units by mouth daily.       Marland Kitchen letrozole (FEMARA) 2.5 MG tablet TAKE 1 TABLET BY MOUTH ONCE DAILY  30 tablet  5  . levothyroxine (SYNTHROID, LEVOTHROID) 50 MCG tablet TAKE 1 TABLET BY MOUTH ONCE DAILY  90 tablet  3  . lisdexamfetamine (VYVANSE) 60 MG capsule Take 1 capsule (60 mg total) by mouth every morning.  30 capsule  0  . Multiple Vitamin (MULTIVITAMIN) capsule Take 3 capsules by mouth daily.       Marland Kitchen sulfamethoxazole-trimethoprim (BACTRIM DS,SEPTRA DS) 800-160 MG per tablet  Take 1 tablet by mouth 2 (two) times daily.  6 tablet  0  . venlafaxine XR (EFFEXOR-XR) 150 MG 24 hr capsule TAKE 1 CAPSULE BY MOUTH ONCE DAILY  90 capsule  3    No Known Allergies  Past Medical History  Diagnosis Date  . Breast cancer   . Allergy     rhinitis  . Anemia   . ADD (attention deficit disorder)   . Obesity   . Renal calculi   . Hypothyroid     Past Surgical History  Procedure Date  . Mastectomy     bilateral  . Breast lumpectomy   . Cesarean section   . Vaginal hysterectomy   . Gastric bypass   . Arthroscopic knee     x 3  . Lithotripsy     History  Smoking status  . Never Smoker   Smokeless tobacco  . Never Used    History  Alcohol Use: Not on file    History reviewed. No pertinent family history.  Review of Systems: The review of systems is positive for chronic knee pain. She is a widow and caring for her 26 year old son. She works as a PA for inpatient behavioral health. All other systems were reviewed and are negative.  Physical Exam: BP 150/94  Pulse 101  Ht 5\' 6"  (1.676 m)  Wt 182 lb (82.555 kg)  BMI 29.38 kg/m2 She is a pleasant, overweight white female in no acute distress.The patient is alert  and oriented x 3.  The mood and affect are normal.  The skin is warm and dry.  Color is normal.  The HEENT exam reveals that the sclera are nonicteric.  The mucous membranes are moist.  The carotids are 2+ without bruits.  There is no thyromegaly.  There is no JVD.  The lungs are clear.  The chest wall is non tender.  The heart exam reveals a regular rate with a normal S1 and S2.  There is a grade 1-2/6 systolic ejection murmur at the upper sternal border.  The PMI is not displaced.   Abdominal exam reveals good bowel sounds.  There is no guarding or rebound.  There is no hepatosplenomegaly or tenderness.  There are no masses.  Exam of the legs reveal no clubbing, cyanosis, or edema.  Her right knee is in a brace. The distal pulses are intact.  Cranial  nerves II - XII are intact.  Motor and sensory functions are intact.  The gait is normal.  LABORATORY DATA: ECG reveals normal sinus rhythm with sinus arrhythmia. It is otherwise normal.  Assessment / Plan: 1. Dyspnea. I suspect this is mostly related to deconditioning and obesity. She is at low risk for coronary disease. She has received remote radiation therapy and chemotherapy for breast cancer. We will update her echocardiogram. If this is normal then I would reassure her. She is being considered for total knee replacement. If her echo is normal then I think she would be a suitable surgical candidate and then perhaps she could work on her conditioning. 2. History breast cancer 3. Hypothyroidism. 4. Elevated blood pressure. Patient reports normal blood pressure readings at work and at home. She is going to monitor her blood pressure closely. She is going to try to reduce her Celebrex to just as needed.

## 2013-02-06 ENCOUNTER — Ambulatory Visit (INDEPENDENT_AMBULATORY_CARE_PROVIDER_SITE_OTHER): Payer: 59 | Admitting: Family Medicine

## 2013-02-06 ENCOUNTER — Encounter: Payer: Self-pay | Admitting: Family Medicine

## 2013-02-06 VITALS — BP 140/90 | HR 60 | Temp 98.3°F | Wt 182.0 lb

## 2013-02-06 DIAGNOSIS — F988 Other specified behavioral and emotional disorders with onset usually occurring in childhood and adolescence: Secondary | ICD-10-CM

## 2013-02-06 DIAGNOSIS — J019 Acute sinusitis, unspecified: Secondary | ICD-10-CM

## 2013-02-06 DIAGNOSIS — F900 Attention-deficit hyperactivity disorder, predominantly inattentive type: Secondary | ICD-10-CM

## 2013-02-06 MED ORDER — AMOXICILLIN-POT CLAVULANATE 875-125 MG PO TABS: 1.0000 | ORAL_TABLET | Freq: Two times a day (BID) | ORAL | Status: AC

## 2013-02-06 MED ORDER — LISDEXAMFETAMINE DIMESYLATE 60 MG PO CAPS: 60.0000 mg | ORAL_CAPSULE | ORAL | Status: DC

## 2013-02-06 NOTE — Progress Notes (Signed)
  Subjective:    Patient ID: Christy [name], female    DOB: Jul 25, 1956, 57 y.o.   MRN: [id number]  HPI She has a ten-day history of sore throat, nasal congestion, PND with a dry cough is now productive, intermittent fever and chills.she also complains of left upper tooth discomfort. She has tried Sudafed and Afrin with minimal benefit. She does have spring and fall allergies. Does not smoke. Prior to this she did have UTI symptoms that did respond to Septra no present frequency, dysuria or urgency. She would also like a refill on her Vyvanse. It does seem to work well. She has tried to remember to take the second dosing of Adderall but usually forgets to do this.  Review of Systems     Objective:   Physical Exam alert and in no distress. Tympanic membranes and canals are normal. Throat is clear. Tonsils are normal. Neck is supple without adenopathy or thyromegaly. Cardiac exam shows a regular sinus rhythm without murmurs or gallops. Lungs are clear to auscultation. Nasal mucosa is pink over of left nasal septum shows recent bleeding. Tender over left maxillary sinus       Assessment & Plan:   1. Acute sinusitis  amoxicillin-clavulanate (AUGMENTIN) 875-125 MG per tablet   amoxicillin-clavulanate (AUGMENTIN) 875-125 MG per tablet  2. ADD (attention deficit hyperactivity disorder, inattentive type)  lisdexamfetamine (VYVANSE) 60 MG capsule   lisdexamfetamine (VYVANSE) 60 MG capsule   lisdexamfetamine (VYVANSE) 60 MG capsule   lisdexamfetamine (VYVANSE) 60 MG capsule  a three-month supply of Vyvanse was renewed

## 2013-02-11 ENCOUNTER — Other Ambulatory Visit: Payer: Self-pay

## 2013-03-29 ENCOUNTER — Encounter: Payer: Self-pay | Admitting: Oncology

## 2013-03-29 ENCOUNTER — Other Ambulatory Visit (HOSPITAL_BASED_OUTPATIENT_CLINIC_OR_DEPARTMENT_OTHER): Payer: 59 | Admitting: Lab

## 2013-03-29 ENCOUNTER — Telehealth: Payer: Self-pay

## 2013-03-29 ENCOUNTER — Ambulatory Visit (HOSPITAL_BASED_OUTPATIENT_CLINIC_OR_DEPARTMENT_OTHER): Payer: 59 | Admitting: Oncology

## 2013-03-29 VITALS — BP 135/78 | HR 92 | Temp 97.5°F | Resp 20 | Ht 66.0 in | Wt 177.5 lb

## 2013-03-29 DIAGNOSIS — R718 Other abnormality of red blood cells: Secondary | ICD-10-CM

## 2013-03-29 DIAGNOSIS — C50419 Malignant neoplasm of upper-outer quadrant of unspecified female breast: Secondary | ICD-10-CM

## 2013-03-29 DIAGNOSIS — M8580 Other specified disorders of bone density and structure, unspecified site: Secondary | ICD-10-CM

## 2013-03-29 DIAGNOSIS — M899 Disorder of bone, unspecified: Secondary | ICD-10-CM

## 2013-03-29 DIAGNOSIS — C50919 Malignant neoplasm of unspecified site of unspecified female breast: Secondary | ICD-10-CM

## 2013-03-29 DIAGNOSIS — M949 Disorder of cartilage, unspecified: Secondary | ICD-10-CM

## 2013-03-29 LAB — CBC WITH DIFFERENTIAL/PLATELET
Basophils Absolute: 0.1 10*3/uL (ref 0.0–0.1)
Eosinophils Absolute: 0.3 10*3/uL (ref 0.0–0.5)
HCT: 37.4 % (ref 34.8–46.6)
HGB: 11.9 g/dL (ref 11.6–15.9)
LYMPH%: 29.4 % (ref 14.0–49.7)
MCV: 78.3 fL — ABNORMAL LOW (ref 79.5–101.0)
MONO%: 8.6 % (ref 0.0–14.0)
NEUT#: 2.7 10*3/uL (ref 1.5–6.5)
NEUT%: 55 % (ref 38.4–76.8)
Platelets: 183 10*3/uL (ref 145–400)
RDW: 15.7 % — ABNORMAL HIGH (ref 11.2–14.5)

## 2013-03-29 LAB — COMPREHENSIVE METABOLIC PANEL (CC13)
Albumin: 3.1 g/dL — ABNORMAL LOW (ref 3.5–5.0)
Alkaline Phosphatase: 141 U/L (ref 40–150)
BUN: 9.5 mg/dL (ref 7.0–26.0)
Creatinine: 0.7 mg/dL (ref 0.6–1.1)
Glucose: 104 mg/dl — ABNORMAL HIGH (ref 70–99)
Potassium: 3.9 mEq/L (ref 3.5–5.1)

## 2013-03-29 LAB — IRON AND TIBC
%SAT: 11 % — ABNORMAL LOW (ref 20–55)
Iron: 45 ug/dL (ref 42–145)

## 2013-03-29 NOTE — Telephone Encounter (Signed)
Message copied by Lorine Bears on Wed Mar 29, 2013  4:35 PM ------      Message from: Christy [name]      Created: Wed Mar 29, 2013 12:33 PM       Labs seen and need follow up: please let her know chemistry results. Also tell her that I added on iron studies & we will let her know about those when resulted ------

## 2013-03-29 NOTE — Patient Instructions (Signed)
[facility] PT.  Let me know if you need referral for this/ for insurance.

## 2013-03-29 NOTE — Progress Notes (Signed)
OFFICE PROGRESS NOTE   03/29/2013   Physicians:J.Lalonde, C.Streck, (J.Tomblin), V.Paul/ F.Turner Daniels   INTERVAL HISTORY:   Patient is seen, alone for visit, in 6 month follow up of her history of 2 primary right breast cancers, most recent  In 2008, continuing Femara which was begun Aug 2008.  History is of 2 primary right breast cancers, in 1993 and May 2008. The initial right breast cancer was stage 2 with 3 of 41 nodes involved, ER/PR negative and HER-2 not done in 1993. She was treated by Dr.John Durward Fortes on NSABP B25 with adriamycin/cytoxan. The most recent breast cancer was 0.6 cm invasive ductal with 3 nodes negative, ER/PR + and HER2 -. She had right mastectomy and prophylactic left mastectomy, with bilateral reconstructions. She additionally had hysterectomy with BSO. She has been on Femara since Aug 2008, which she is tolerating well. The bone density scan Sept 2013 showed osteopenia, with T score in LS -1.7 and femoral neck -1.9; this is unchanged in hip and increased 3% in LS spine compared with 08-2010, tho is it down 11.5% in spine and 9% in hip compared with baseline bone density in 2006. She is on calcium and Vitamin D, is not getting any regular exercise primarily due to limitations from knee.   Patient reports ongoing consideration of knee replacement by orthopedist; she understands that physician recommends bisphosphonate treatment. She has never tried oral bisphosphonate, tho wonders if this would be safe with her gastric bypass. She also has dental problems which may preclude bisphosphonate for now. She will discuss with PCP. In interim, I have encouraged her to begin regular exercise program, which may include water exercises due to knee problems. She can tell that she is deconditioned and that upper body strength is poor, of concern as she will need walker after kneew surgery. She may be interested in PT assistance with exercise program, and I have given her contact information for  physical therapist who also works thru [employer]; I am glad to make that referral if needed.  She has no concerns that seem referable to the history of breast cancer or ongoing treatment otherwise She and others at work had prolonged lower respiratory infection this winter, which resolved after ~ 4 weeks. She has no new or different pain, no bleeding, no GI problems now (tho norovirus at her work now).  Remainder of 10 point Review of Systems negative.   Younger son Christy [name] will graduate from high school this spring, now 6'2"  Objective:  Vital signs in last 24 hours:  BP 135/78  Pulse 92  Temp(Src) 97.5 F (36.4 C) (Oral)  Resp 20  Ht 5\' 6"  (1.676 m)  Wt 177 lb 8 oz (80.513 kg)  BMI 28.66 kg/m2 Weight is down 2.5 lbs.   HEENT:PERRLA, sclera clear, anicteric and oropharynx clear, no lesions LymphaticsCervical, supraclavicular, and axillary nodes normal. Resp: clear to auscultation bilaterally and normal percussion bilaterally Cardio: regular rate and rhythm GI: soft, non-tender; bowel sounds normal; no masses,  no organomegaly Extremities: extremities normal, atraumatic, no cyanosis or edema Neuro:nonfocal Bilateral reconstructed breasts without findings of concern for local recurrence, axillae benign, no swelling UE Skin without rash or ecchymosis  Lab Results:  Results for orders placed in visit on 03/29/13  CBC WITH DIFFERENTIAL      Result Value Range   WBC 4.9  3.9 - 10.3 10e3/uL   NEUT# 2.7  1.5 - 6.5 10e3/uL   HGB 11.9  11.6 - 15.9 g/dL   HCT 16.1  09.6 -  46.6 %   Platelets 183  145 - 400 10e3/uL   MCV 78.3 (*) 79.5 - 101.0 fL   MCH 24.9 (*) 25.1 - 34.0 pg   MCHC 31.8  31.5 - 36.0 g/dL   RBC 8.29  5.62 - 1.30 10e6/uL   RDW 15.7 (*) 11.2 - 14.5 %   lymph# 1.4  0.9 - 3.3 10e3/uL   MONO# 0.4  0.1 - 0.9 10e3/uL   Eosinophils Absolute 0.3  0.0 - 0.5 10e3/uL   Basophils Absolute 0.1  0.0 - 0.1 10e3/uL   NEUT% 55.0  38.4 - 76.8 %   LYMPH% 29.4  14.0 - 49.7 %   MONO% 8.6  0.0  - 14.0 %   EOS% 5.5  0.0 - 7.0 %   BASO% 1.5  0.0 - 2.0 %  COMPREHENSIVE METABOLIC PANEL (CC13)      Result Value Range   Sodium 143  136 - 145 mEq/L   Potassium 3.9  3.5 - 5.1 mEq/L   Chloride 107  98 - 107 mEq/L   CO2 28  22 - 29 mEq/L   Glucose 104 (*) 70 - 99 mg/dl   BUN 9.5  7.0 - 86.5 mg/dL   Creatinine 0.7  0.6 - 1.1 mg/dL   Total Bilirubin 7.84  0.20 - 1.20 mg/dL   Alkaline Phosphatase 141  40 - 150 U/L   AST 24  5 - 34 U/L   ALT 22  0 - 55 U/L   Total Protein 6.4  6.4 - 8.3 g/dL   Albumin 3.1 (*) 3.5 - 5.0 g/dL   Calcium 8.9  8.4 - 69.6 mg/dL    VIt D available after visit in good range at 41 Studies/Results:  Last bone density scan was 09-20-2012 at [facility]; she will need this yearly with known low bone density on chronic high risk medication  Medications: I have reviewed the patient's current medications.  Assessment/Plan:  1.History of 2 primary right breast cancer as above: continuing Femara. I will see her back in 6 months or sooner if needed.  2.Osteopenia preceeding aromatase inhibitor: repeat bone density some improvement compared with 2011 tho still osteopenic. Risk factors include ongoing aromatase inhibitor, gastric bypass, lack of exercise. She will discuss bisphosphonate with PCP. She will have repeat bone denisity scan ~ late Sept shortly prior to my next visit.She will begin regular exercise program, possibly with assistance of PT, possible including water exercise. 4.hysterectomy with oophorectomy  5.hypothyroidism on replacement  6.orthopedic problems with knee  7.prophylactic left mastectomy, bilateral breast reconstructions   [name] P, MD   03/29/2013, 10:14 AM

## 2013-03-29 NOTE — Telephone Encounter (Signed)
Gave Ms. Sirianni chemistry results as noted below and added iron studies to follow.

## 2013-03-30 ENCOUNTER — Other Ambulatory Visit: Payer: Self-pay | Admitting: Oncology

## 2013-03-30 DIAGNOSIS — M8580 Other specified disorders of bone density and structure, unspecified site: Secondary | ICD-10-CM

## 2013-03-31 ENCOUNTER — Other Ambulatory Visit: Payer: Self-pay | Admitting: Oncology

## 2013-03-31 ENCOUNTER — Encounter: Payer: Self-pay | Admitting: Internal Medicine

## 2013-03-31 DIAGNOSIS — M8580 Other specified disorders of bone density and structure, unspecified site: Secondary | ICD-10-CM

## 2013-04-04 ENCOUNTER — Other Ambulatory Visit: Payer: Self-pay

## 2013-04-04 ENCOUNTER — Telehealth: Payer: Self-pay

## 2013-04-04 NOTE — Telephone Encounter (Signed)
Discussed lab results as noted below by Dr. Darrold Span.   Christy [name] may be able to to colonoscopy in June as she jusut requested vacation that month. She is in agreement to receive IV Iron.   She will be off from work on 04-10-13 other wise 3 pm other days fo the week would work. POF sent to scheduling.

## 2013-04-04 NOTE — Telephone Encounter (Signed)
Message copied by Lorine Bears on Tue Apr 04, 2013  3:39 PM ------      Message from: Reece Packer      Created: Fri Mar 31, 2013  5:19 PM       Labs seen and need follow up: please let Christy [name] know that her iron studies were low (iron 45, %sat 11 and ferritin 3). This may be related to poor iron absorption with the gastric bypass, but she should also do hemoccult x 3 and go ahead with colonoscopy.  Probably will be best from iron absorption standpoint to give IV feraheme. OK to set this up if she agrees, and let me know date to put in orders. ------

## 2013-04-05 ENCOUNTER — Other Ambulatory Visit: Payer: Self-pay | Admitting: Oncology

## 2013-04-05 DIAGNOSIS — D509 Iron deficiency anemia, unspecified: Secondary | ICD-10-CM

## 2013-04-06 ENCOUNTER — Telehealth: Payer: Self-pay | Admitting: Oncology

## 2013-04-06 ENCOUNTER — Ambulatory Visit (HOSPITAL_BASED_OUTPATIENT_CLINIC_OR_DEPARTMENT_OTHER): Payer: 59

## 2013-04-06 VITALS — BP 155/96 | HR 89 | Temp 98.3°F

## 2013-04-06 DIAGNOSIS — D509 Iron deficiency anemia, unspecified: Secondary | ICD-10-CM

## 2013-04-06 MED ORDER — SODIUM CHLORIDE 0.9 % IV SOLN
1020.0000 mg | Freq: Once | INTRAVENOUS | Status: AC
  Administered 2013-04-06: 1020 mg via INTRAVENOUS
  Filled 2013-04-06: qty 34

## 2013-04-06 NOTE — Patient Instructions (Signed)
Ferumoxytol injection What is this medicine? FERUMOXYTOL is an iron complex. Iron is used to make healthy red blood cells, which carry oxygen and nutrients throughout the body. This medicine is used to treat iron deficiency anemia in people with chronic kidney disease. This medicine may be used for other purposes; ask your health care provider or pharmacist if you have questions. What should I tell my health care provider before I take this medicine? They need to know if you have any of these conditions: -anemia not caused by low iron levels -high levels of iron in the blood -magnetic resonance imaging (MRI) test scheduled -an unusual or allergic reaction to iron, other medicines, foods, dyes, or preservatives -pregnant or trying to get pregnant -breast-feeding How should I use this medicine? This medicine is for infusion into a vein. It is given by a health care professional in a hospital or clinic setting. Talk to your pediatrician regarding the use of this medicine in children. Special care may be needed. Overdosage: If you think you've taken too much of this medicine contact a poison control center or emergency room at once. Overdosage: If you think you have taken too much of this medicine contact a poison control center or emergency room at once. NOTE: This medicine is only for you. Do not share this medicine with others. What if I miss a dose? It is important not to miss your dose. Call your doctor or health care professional if you are unable to keep an appointment. What may interact with this medicine? This medicine may interact with the following medications: -other iron products This list may not describe all possible interactions. Give your health care provider a list of all the medicines, herbs, non-prescription drugs, or dietary supplements you use. Also tell them if you smoke, drink alcohol, or use illegal drugs. Some items may interact with your medicine. What should I watch  for while using this medicine? Visit your doctor or healthcare professional regularly. Tell your doctor or healthcare professional if your symptoms do not start to get better or if they get worse. You may need blood work done while you are taking this medicine. You may need to follow a special diet. Talk to your doctor. Foods that contain iron include: whole grains/cereals, dried fruits, beans, or peas, leafy green vegetables, and organ meats (liver, kidney). What side effects may I notice from receiving this medicine? Side effects that you should report to your doctor or health care professional as soon as possible: -allergic reactions like skin rash, itching or hives, swelling of the face, lips, or tongue -breathing problems -changes in blood pressure -feeling faint or lightheaded, falls -fever or chills -flushing, sweating, or hot feelings -swelling of the ankles or feet Side effects that usually do not require medical attention (Report these to your doctor or health care professional if they continue or are bothersome.): -diarrhea -headache -nausea, vomiting -stomach pain This list may not describe all possible side effects. Call your doctor for medical advice about side effects. You may report side effects to FDA at 1-800-FDA-1088. Where should I keep my medicine? This drug is given in a hospital or clinic and will not be stored at home. NOTE: This sheet is a summary. It may not cover all possible information. If you have questions about this medicine, talk to your doctor, pharmacist, or health care provider.  2013, Elsevier/Gold Standard. (09/05/2008 9:48:25 PM)  

## 2013-04-06 NOTE — Telephone Encounter (Signed)
S/w the pt and she is aware of her sept appt for the bone density along with the oct appts for the lab and the md.

## 2013-04-07 ENCOUNTER — Telehealth: Payer: Self-pay

## 2013-04-07 ENCOUNTER — Encounter: Payer: Self-pay | Admitting: Oncology

## 2013-04-07 NOTE — Telephone Encounter (Signed)
Told Ms. Cochrane that she received the full dose of IV feraheme and does  not need repeat iron studies. She can recheck cbc with PCP appointment in June.  Pt. verbalized understanding.

## 2013-07-04 ENCOUNTER — Encounter: Payer: Self-pay | Admitting: Family Medicine

## 2013-07-04 ENCOUNTER — Ambulatory Visit (INDEPENDENT_AMBULATORY_CARE_PROVIDER_SITE_OTHER): Payer: 59 | Admitting: Family Medicine

## 2013-07-04 VITALS — BP 130/80 | HR 114 | Wt 174.0 lb

## 2013-07-04 DIAGNOSIS — F900 Attention-deficit hyperactivity disorder, predominantly inattentive type: Secondary | ICD-10-CM | POA: Insufficient documentation

## 2013-07-04 DIAGNOSIS — K649 Unspecified hemorrhoids: Secondary | ICD-10-CM

## 2013-07-04 DIAGNOSIS — Z23 Encounter for immunization: Secondary | ICD-10-CM

## 2013-07-04 DIAGNOSIS — M8580 Other specified disorders of bone density and structure, unspecified site: Secondary | ICD-10-CM

## 2013-07-04 DIAGNOSIS — M949 Disorder of cartilage, unspecified: Secondary | ICD-10-CM

## 2013-07-04 DIAGNOSIS — F988 Other specified behavioral and emotional disorders with onset usually occurring in childhood and adolescence: Secondary | ICD-10-CM

## 2013-07-04 DIAGNOSIS — M899 Disorder of bone, unspecified: Secondary | ICD-10-CM

## 2013-07-04 DIAGNOSIS — Z5189 Encounter for other specified aftercare: Secondary | ICD-10-CM

## 2013-07-04 MED ORDER — VENLAFAXINE HCL ER 150 MG PO CP24: ORAL_CAPSULE | ORAL | Status: DC

## 2013-07-04 MED ORDER — LISDEXAMFETAMINE DIMESYLATE 60 MG PO CAPS: 60.0000 mg | ORAL_CAPSULE | ORAL | Status: DC

## 2013-07-04 MED ORDER — HYDROCORTISONE ACETATE 30 MG RE SUPP: 1.0000 | Freq: Two times a day (BID) | RECTAL | Status: DC | PRN

## 2013-07-04 NOTE — Progress Notes (Signed)
  Subjective:    Patient ID: Christy [name], female    DOB: 29-Nov-1956, 57 y.o.   MRN: [id number]  HPI She is here for consult concerning multiple issues. She states she has hemorrhoids and would like a cream for this. She is not interested in having me evaluate this. She also needs an immunization update. She continues to do well on her Vyvanse. She is not using it regularly. She continues on Effexor which she has been on since her chemotherapy for breast cancer. It has helped with hot flashes. She also notes that it helps her deal with work related stress. She works at [employer].   Review of Systems     Objective:   Physical Exam Alert and in no distress with appropriate affect.       Assessment & Plan:  Immunization due - Plan: Tdap vaccine greater than or equal to 7yo IM  Hemorrhoid - Plan: HYDROCORTISONE ACE, RECTAL, 30 MG SUPP  ADD (attention deficit hyperactivity disorder, inattentive type) - Plan: lisdexamfetamine (VYVANSE) 60 MG capsule  Hot flashes related to aromatase inhibitor therapy, subsequent encounter - Plan: venlafaxine XR (EFFEXOR-XR) 150 MG 24 hr capsule  I will give her a 90 day supply of the Vyvanse. Renew the Effexor. She will call if she has further difficulty with hemorrhoids. Discussed internal versus external hemorrhoids with her. She does have a history of osteopenia color is reluctant to take a bisphosphonate due to her previous cancer history. I recommended she discuss this with her orthopedic surgeon to get his input into potentially being placed on a medicine to help with her osteopenia.

## 2013-07-05 ENCOUNTER — Other Ambulatory Visit: Payer: Self-pay | Admitting: Oncology

## 2013-07-05 DIAGNOSIS — C50919 Malignant neoplasm of unspecified site of unspecified female breast: Secondary | ICD-10-CM

## 2013-07-14 ENCOUNTER — Telehealth (INDEPENDENT_AMBULATORY_CARE_PROVIDER_SITE_OTHER): Payer: Self-pay

## 2013-07-14 NOTE — Telephone Encounter (Signed)
The pt called in to see if she can be seen for hemorrhoids.  She has had trouble with them for 2 weeks with bleeding and some discomfort.  I offered her to see Dr Andrey Campanile this afternoon but she has heard about Dr Maisie Fus and wants to wait for her.  I made an appointment for Tuesday.  I told her to do warm tub soaks, drink plenty of liquids and make sure she is getting enough fiber in her diet.

## 2013-07-18 ENCOUNTER — Encounter: Payer: Self-pay | Admitting: Internal Medicine

## 2013-07-18 ENCOUNTER — Telehealth (INDEPENDENT_AMBULATORY_CARE_PROVIDER_SITE_OTHER): Payer: Self-pay | Admitting: General Surgery

## 2013-07-18 ENCOUNTER — Ambulatory Visit (INDEPENDENT_AMBULATORY_CARE_PROVIDER_SITE_OTHER): Payer: Commercial Managed Care - PPO | Admitting: General Surgery

## 2013-07-18 ENCOUNTER — Encounter (INDEPENDENT_AMBULATORY_CARE_PROVIDER_SITE_OTHER): Payer: Self-pay | Admitting: General Surgery

## 2013-07-18 ENCOUNTER — Telehealth: Payer: Self-pay | Admitting: Internal Medicine

## 2013-07-18 VITALS — BP 152/76 | HR 92 | Temp 99.3°F | Resp 16 | Ht 66.0 in | Wt 175.2 lb

## 2013-07-18 DIAGNOSIS — K625 Hemorrhage of anus and rectum: Secondary | ICD-10-CM | POA: Insufficient documentation

## 2013-07-18 NOTE — Telephone Encounter (Signed)
Spoke with patient and scheduled patient on 07/28/13 at 2:00 PM for OV with Dr. Juanda Chance.

## 2013-07-18 NOTE — Telephone Encounter (Signed)
Helmut Muster, I will see her, I dont's see where she has ever had a colonoscopy ( may be it is in the paper chart, which has not been scanned yet). Rene Kocher, could you, please, reschedule her appointment for earlier? ( she has one for October) Thanx ----- Message ----- From: Romie Levee, MD Sent: 07/18/2013 11:12 AM To: Hart Carwin, MD, Ronnald Nian, MD

## 2013-07-18 NOTE — Patient Instructions (Addendum)

## 2013-07-18 NOTE — Progress Notes (Signed)
Chief Complaint  Patient presents with  . New Evaluation    eval hems    HISTORY: Christy [name] is a 57 y.o. female who presents to the office with rectal bleeding for several weeks.  She has had prolapsing tissue for years.  Other symptoms include occasional pain.  She has tried [facility] suppositories in the past with some success.  Nothing makes the symptoms worse.   It is intermittent in nature.  Her bowel habits are regular and her bowel movements are occasionally hard, mostly loose stools.  Her fiber intake is dietary.  Her last colonoscopy was about 6 years ago.  SHe also c/o some reflux and a couple episodes of aspiration pneumonia.      Past Medical History  Diagnosis Date  . Breast cancer   . Allergy     rhinitis  . Anemia   . ADD (attention deficit disorder)   . Obesity   . Renal calculi   . Hypothyroid       Past Surgical History  Procedure Laterality Date  . Mastectomy      bilateral  . Cesarean section    . Vaginal hysterectomy    . Gastric bypass    . Arthroscopic knee      x 3  . Lithotripsy    . Cholecystectomy    . Breast lumpectomy    . Mastectomy      double, total        Current Outpatient Prescriptions  Medication Sig Dispense Refill  . celecoxib (CELEBREX) 200 MG capsule Take 200 mg by mouth daily.      . Cholecalciferol 5000 UNITS capsule Take 2,000 Units by mouth daily.       Marland Kitchen HYDROCORTISONE ACE, RECTAL, 30 MG SUPP Place 1 suppository (30 mg total) rectally 2 (two) times daily as needed.  20 each  1  . letrozole (FEMARA) 2.5 MG tablet TAKE 1 TABLET BY MOUTH ONCE DAILY  30 tablet  5  . levothyroxine (SYNTHROID, LEVOTHROID) 50 MCG tablet TAKE 1 TABLET BY MOUTH ONCE DAILY  90 tablet  3  . lisdexamfetamine (VYVANSE) 60 MG capsule Take 1 capsule (60 mg total) by mouth every morning.  90 capsule  0  . Multiple Vitamin (MULTIVITAMIN) capsule Take 3 capsules by mouth daily.       Marland Kitchen venlafaxine XR (EFFEXOR-XR) 150 MG 24 hr capsule TAKE 1 CAPSULE BY MOUTH ONCE  DAILY  90 capsule  3   No current facility-administered medications for this visit.      No Known Allergies    Family History  Problem Relation Age of Onset  . COPD Father   . Cancer Paternal Aunt     breast    History   Social History  . Marital Status: Widowed    Spouse Name: N/A    Number of Children: 1  . Years of Education: N/A   Occupational History  . PA behavioral [employer]   Social History Main Topics  . Smoking status: Never Smoker   . Smokeless tobacco: Never Used  . Alcohol Use: None  . Drug Use: None  . Sexually Active: None   Other Topics Concern  . None   Social History Narrative  . None      REVIEW OF SYSTEMS - PERTINENT POSITIVES ONLY: Review of Systems - General ROS: negative for - chills, fever or weight loss Hematological and Lymphatic ROS: negative for - bleeding problems, blood clots or bruising Respiratory ROS: no cough,  shortness of breath, or wheezing Cardiovascular ROS: no chest pain or dyspnea on exertion Gastrointestinal ROS: positive for - blood in stools negative for - abdominal pain or nausea/vomiting Genito-Urinary ROS: no dysuria, trouble voiding, or hematuria  EXAM: Filed Vitals:   07/18/13 1031  BP: 152/76  Pulse: 92  Temp: 99.3 F (37.4 C)  Resp: 16    General appearance: alert and cooperative Resp: clear to auscultation bilaterally Cardio: regular rate and rhythm GI: soft, non-tender; bowel sounds normal; no masses,  no organomegaly   Procedure: Anoscopy Surgeon: Maisie Fus Diagnosis: rectal bleeding  Assistant: Christella Scheuermann After the risks and benefits were explained, verbal consent was obtained for above procedure  Anesthesia: none Findings: thrombosed R posterior hemorrhoid, grade 1 internal hemorrhoids    ASSESSMENT AND PLAN: Christy [name] is a 56 y.o. F s/p gastric bypass, who takes NSAIDs daily.  She is having new onset rectal bleeding.  On exam she has some minimal internal hemorrhoids and a  thrombosed external hemorrhoid.  Given this new bleeding, I would like her to return to Dr Juanda Chance for colonoscopy.  She is also having some worsening reflux and aspiration pna.  She was thinking she may be able to get an EGD at the same time.  I think this is a reasonable plan and will pass the info along to Dr Juanda Chance.  I have asked her to use a fiber supplement to help with her loose stools.  She will do sitz baths as needed for pain.  She can use her suppository PRN for bleeding.      Vanita Panda, MD Colon and Rectal Surgery / General Surgery [facility]      Visit Diagnoses: No diagnosis found.  Primary Care Physician: Carollee Herter, MD

## 2013-07-18 NOTE — Telephone Encounter (Signed)
Called patient to make her aware of colonoscopy appt with Dr Juanda Chance on 09/29/2013 at 9:30 am. She states Dr Regino Schultze office already called with appt and pre-colonoscopy evaluation on 07/28/2013 @ 2:00 pm to see if this appt needs moved up.

## 2013-07-19 ENCOUNTER — Encounter: Payer: Self-pay | Admitting: *Deleted

## 2013-07-28 ENCOUNTER — Encounter: Payer: Self-pay | Admitting: Internal Medicine

## 2013-07-28 ENCOUNTER — Ambulatory Visit (INDEPENDENT_AMBULATORY_CARE_PROVIDER_SITE_OTHER): Payer: 59 | Admitting: Internal Medicine

## 2013-07-28 VITALS — BP 122/80 | HR 88 | Ht 65.5 in | Wt 178.0 lb

## 2013-07-28 DIAGNOSIS — K625 Hemorrhage of anus and rectum: Secondary | ICD-10-CM

## 2013-07-28 DIAGNOSIS — K219 Gastro-esophageal reflux disease without esophagitis: Secondary | ICD-10-CM

## 2013-07-28 HISTORY — PX: COLONOSCOPY: SHX174

## 2013-07-28 MED ORDER — MOVIPREP 100 G PO SOLR: 1.0000 | Freq: Once | ORAL | Status: DC

## 2013-07-28 MED ORDER — RANITIDINE HCL 300 MG PO TABS: 300.0000 mg | ORAL_TABLET | Freq: Every day | ORAL | Status: DC

## 2013-07-28 NOTE — Patient Instructions (Addendum)
You have been scheduled for an endoscopy and colonoscopy with propofol. Please follow the written instructions given to you at your visit today. Please pick up your prep at the pharmacy within the next 1-3 days. If you use inhalers (even only as needed), please bring them with you on the day of your procedure. Your physician has requested that you go to www.startemmi.com and enter the access code given to you at your visit today. This web site gives a general overview about your procedure. However, you should still follow specific instructions given to you by our office regarding your preparation for the procedure.  We have sent the following medications to your pharmacy for you to pick up at your convenience: Ranitidine  CC: Dr Lajean Silvius, Dr Susann Givens

## 2013-07-28 NOTE — Progress Notes (Signed)
Christy [name] [birth year] MRN [id number]  History of Present Illness:  This is a 57 year old white female with symptomatic hemorrhoids and low-volume rectal bleeding. She is here today because of rectal bleeding as well as gastroesophageal reflux. We have seen her in the past for a screening colonoscopy. She had the colonoscopy in 1997 and again in May 2008 which showed adenomatous polyps x 2 and internal hemorrhoids. She has a history of breast cancer in 1992 and a reccurence  in 2008. She has occasional pharyngeal reflux at night and had 2 episodes of aspiration pneumonia in the last several years. She does not take any acid reducing agents although she has Protonix at home. It gives her diarrhea so she doesn't take it. She has a history of gastric bypass in 2003 which resulted in a weight loss from 257 pounds to 163 pounds. She is currently stable at 178 pounds.   Past Medical History  Diagnosis Date  . Breast cancer   . Allergy     rhinitis  . Anemia   . ADD (attention deficit disorder)   . Obesity   . Renal calculi   . Hypothyroid   . Internal hemorrhoids   . Tubular adenoma of colon    Past Surgical History  Procedure Laterality Date  . Mastectomy Bilateral     with lymph node dissection  . Cesarean section      x 2  . Vaginal hysterectomy    . Gastric bypass    . Arthroscopic knee Right     x 3  . Lithotripsy    . Cholecystectomy    . Breast lumpectomy Right     reports that she has never smoked. She has never used smokeless tobacco. She reports that  drinks alcohol. She reports that she does not use illicit drugs. family history includes Breast cancer in her paternal aunt; COPD in her father; and Colon polyps in her mother. No Known Allergies      Review of Systems: Occasional dysphagia. Occasional heartburn  The remainder of the 10 point ROS is negative except as outlined in H&P   Physical Exam: General appearance  Well developed, in no distress. Eyes- non  icteric. HEENT nontraumatic, normocephalic. Mouth no lesions, tongue papillated, no cheilosis. Neck supple without adenopathy, thyroid not enlarged, no carotid bruits, no JVD. Lungs Clear to auscultation bilaterally. Cor normal S1, normal S2, regular rhythm, no murmur,  quiet precordium. Abdomen: Status post Tram procedure, soft abdomen. Nontender. Liver edge at costal margin. Rectal: Small external hemorrhoids. Soft Hemoccult negative stool. Extremities no pedal edema. Skin no lesions. Neurological alert and oriented x 3. Psychological normal mood and affect.  Assessment and Plan:  Problem #64 57 year old white female with symptomatic hemorrhoids and low-volume rectal bleeding. She is due for a recall colonoscopy because of her personal history of adenomatous polyps in 2008. We will set her up for a screening colonoscopy. She has been using Anusol-HC suppositories with good results. The bleeding has stopped.  Problem #2 Gastroesophageal reflux. Patient has a history of gastric bypass for obesity. She is having LPR and a history of aspiration pneumonia. We will start her on  ranitidine 300 mg at bedtime to avoid nocturnal reflux.  She was asked to follow antireflux measures. We will proceed with an upper endoscopy the same day as the colonoscopy.   07/28/2013 Christy [name]

## 2013-07-31 ENCOUNTER — Encounter: Payer: Self-pay | Admitting: Internal Medicine

## 2013-09-15 ENCOUNTER — Ambulatory Visit (AMBULATORY_SURGERY_CENTER): Payer: Self-pay | Admitting: *Deleted

## 2013-09-15 VITALS — Ht 65.5 in | Wt 178.0 lb

## 2013-09-15 DIAGNOSIS — K219 Gastro-esophageal reflux disease without esophagitis: Secondary | ICD-10-CM

## 2013-09-15 DIAGNOSIS — Z86010 Personal history of colonic polyps: Secondary | ICD-10-CM

## 2013-09-15 NOTE — Progress Notes (Signed)
Pt has her Moviprep at home, so no RX sent to pharmacy  She has no soy or egg allergies

## 2013-09-22 ENCOUNTER — Other Ambulatory Visit: Payer: 59

## 2013-09-26 ENCOUNTER — Other Ambulatory Visit: Payer: Self-pay | Admitting: Internal Medicine

## 2013-09-26 DIAGNOSIS — D509 Iron deficiency anemia, unspecified: Secondary | ICD-10-CM

## 2013-09-27 ENCOUNTER — Other Ambulatory Visit: Payer: 59 | Admitting: Lab

## 2013-09-27 ENCOUNTER — Ambulatory Visit: Payer: 59

## 2013-09-28 ENCOUNTER — Telehealth: Payer: Self-pay | Admitting: Oncology

## 2013-09-28 NOTE — Telephone Encounter (Signed)
Pt scheduled with Darrold Span, a former pt of hers for 10/13 with labs

## 2013-09-29 ENCOUNTER — Encounter: Payer: 59 | Admitting: Internal Medicine

## 2013-10-03 ENCOUNTER — Telehealth: Payer: Self-pay | Admitting: *Deleted

## 2013-10-03 ENCOUNTER — Telehealth: Payer: Self-pay | Admitting: Internal Medicine

## 2013-10-03 NOTE — Telephone Encounter (Signed)
Patient ate a granola bar with peanuts in it for breakfastthis am.   I told her to stick to the clear liquids all day today, and to take all of the prep.  She agreed, and will be in tomorrow.

## 2013-10-04 ENCOUNTER — Encounter: Payer: Self-pay | Admitting: Internal Medicine

## 2013-10-04 ENCOUNTER — Ambulatory Visit (AMBULATORY_SURGERY_CENTER): Payer: 59 | Admitting: Internal Medicine

## 2013-10-04 ENCOUNTER — Ambulatory Visit
Admission: RE | Admit: 2013-10-04 | Discharge: 2013-10-04 | Disposition: A | Discharge status: Home / Self Care | Payer: 59 | Source: Ambulatory Visit | Attending: Oncology | Admitting: Oncology

## 2013-10-04 VITALS — BP 134/71 | HR 74 | Temp 99.0°F | Resp 18 | Ht 65.5 in | Wt 178.0 lb

## 2013-10-04 DIAGNOSIS — D131 Benign neoplasm of stomach: Secondary | ICD-10-CM

## 2013-10-04 DIAGNOSIS — Z9884 Bariatric surgery status: Secondary | ICD-10-CM

## 2013-10-04 DIAGNOSIS — D126 Benign neoplasm of colon, unspecified: Secondary | ICD-10-CM

## 2013-10-04 DIAGNOSIS — M8580 Other specified disorders of bone density and structure, unspecified site: Secondary | ICD-10-CM

## 2013-10-04 DIAGNOSIS — Z86010 Personal history of colonic polyps: Secondary | ICD-10-CM

## 2013-10-04 DIAGNOSIS — D509 Iron deficiency anemia, unspecified: Secondary | ICD-10-CM

## 2013-10-04 DIAGNOSIS — K219 Gastro-esophageal reflux disease without esophagitis: Secondary | ICD-10-CM

## 2013-10-04 DIAGNOSIS — K200 Eosinophilic esophagitis: Secondary | ICD-10-CM

## 2013-10-04 MED ORDER — RANITIDINE HCL 300 MG PO TABS: 300.0000 mg | ORAL_TABLET | Freq: Every day | ORAL | Status: DC

## 2013-10-04 MED ORDER — SODIUM CHLORIDE 0.9 % IV SOLN: 500.0000 mL | INTRAVENOUS | Status: DC

## 2013-10-04 NOTE — Patient Instructions (Signed)
YOU HAD AN ENDOSCOPIC PROCEDURE TODAY AT [facility]: Refer to the procedure report that was given to you for any specific questions about what was found during the examination.  If the procedure report does not answer your questions, please call your gastroenterologist to clarify.  If you requested that your care partner not be given the details of your procedure findings, then the procedure report has been included in a sealed envelope for you to review at your convenience later.  YOU SHOULD EXPECT: Some feelings of bloating in the abdomen. Passage of more gas than usual.  Walking can help get rid of the air that was put into your GI tract during the procedure and reduce the bloating. If you had a lower endoscopy (such as a colonoscopy or flexible sigmoidoscopy) you may notice spotting of blood in your stool or on the toilet paper. If you underwent a bowel prep for your procedure, then you may not have a normal bowel movement for a few days.  DIET: Your first meal following the procedure should be a light meal and then it is ok to progress to your normal diet.  A half-sandwich or bowl of soup is an example of a good first meal.  Heavy or fried foods are harder to digest and may make you feel nauseous or bloated.  Likewise meals heavy in dairy and vegetables can cause extra gas to form and this can also increase the bloating.  Drink plenty of fluids but you should avoid alcoholic beverages for 24 hours.  ACTIVITY: Your care partner should take you home directly after the procedure.  You should plan to take it easy, moving slowly for the rest of the day.  You can resume normal activity the day after the procedure however you should NOT DRIVE or use heavy machinery for 24 hours (because of the sedation medicines used during the test).    SYMPTOMS TO REPORT IMMEDIATELY: A gastroenterologist can be reached at any hour.  During normal business hours, 8:30 AM to 5:00 PM Monday through Friday,  call [phone].  After hours and on weekends, please call the GI answering service at [phone]  Emergency number who will take a message and have the physician on call contact you.   Following lower endoscopy (colonoscopy or flexible sigmoidoscopy):  Excessive amounts of blood in the stool  Significant tenderness or worsening of abdominal pains  Swelling of the abdomen that is new, acute  Fever of 100F or higher  Following upper endoscopy (EGD)  Vomiting of blood or coffee ground material  New chest pain or pain under the shoulder blades  Painful or persistently difficult swallowing  New shortness of breath  Fever of 100F or higher  Black, tarry-looking stools  FOLLOW UP: If any biopsies were taken you will be contacted by phone or by letter within the next 1-3 weeks.  Call your gastroenterologist if you have not heard about the biopsies in 3 weeks.  Our staff will call the home number listed on your records the next business day following your procedure to check on you and address any questions or concerns that you may have at that time regarding the information given to you following your procedure. This is a courtesy call and so if there is no answer at the home number and we have not heard from you through the emergency physician on call, we will assume that you have returned to your regular daily activities without incident.  SIGNATURES/CONFIDENTIALITY: You  and/or your care partner have signed paperwork which will be entered into your electronic medical record.  These signatures attest to the fact that that the information above on your After Visit Summary has been reviewed and is understood.  Full responsibility of the confidentiality of this discharge information lies with you and/or your care-partner.

## 2013-10-04 NOTE — Progress Notes (Signed)
Called to room to assist during endoscopic procedure.  Patient ID and intended procedure confirmed with present staff. Received instructions for my participation in the procedure from the performing physician.  

## 2013-10-04 NOTE — Op Note (Signed)
[facility] [address] [place], [address]   ENDOSCOPY PROCEDURE REPORT  PATIENT: Christy [name], Christy [name]  MR#: [id number] BIRTHDATE: 1956-05-23 , 57  yrs. old GENDER: Female ENDOSCOPIST: Hart Carwin, MD REFERRED BY:  Sharlot Gowda, M.D. PROCEDURE DATE:  10/04/2013 PROCEDURE:  EGD w/ biopsy ASA CLASS:     Class II INDICATIONS:  Heartburn.   History of esophageal reflux.   suspected LPR, hx aspiration pneumonia, hx gastric bypass 2003,. MEDICATIONS: MAC sedation, administered by CRNA and propofol (Diprivan) 250mg  IV TOPICAL ANESTHETIC: Cetacaine Spray  DESCRIPTION OF PROCEDURE: After the risks benefits and alternatives of the procedure were thoroughly explained, informed consent was obtained.  The LB [id number] endoscope was introduced through the mouth and advanced to the proximal jejunum. Without limitations.  The instrument was slowly withdrawn as the mucosa was fully examined.      E[sophagus: Esophageal mucosa appeared normal in the proximal and mid esophagus. At the GE junction there was mild fibrosis and mild constriction but no definite stricture. Endoscope traverse into hiatal hernia without difficulty. GE junction was located at 35 cm from the incisors. There was a 3 cm nonreducible hiatal hernia without Cameron erosions. Stomach: Proximal stomach was normal. Patient had prior gastric bypass which consisted of gastrojejunostomy. The efferent and the afferent limbs of the jejunum  were visualized, the stoma showed mild inflammatory changes c/o local erythema but no erosions or bleeding. There was small amount of bile retained in the gastric remnant which measured 13 cm. Jejunum :efferent and afferent limbs of the jejunum were examined and appeared normal The endoscope was then brought back into the stomach and biopsies were taken from the anastomosis. Multiple biopsies were also taken from GE junction. Retroflexion of the endoscope in the  stomach confirmed presence of hiatal hernia          The scope was then withdrawn from the patient and the procedure completed.  COMPLICATIONS: There were no complications. ENDOSCOPIC IMPRESSION: tonic esophagitis with a mild fibrosis in distal esophagus status post biopsies. No acute esophagiti status post a gastric bypass for obesity with the widely patent gastrojejunal anastomosis Mild gastritis at the anastomoses the.status post biopsies  RECOMMENDATIONS: 1.  Await pathology results 2.  Anti-reflux regimen to be follow 3.  Continue current meds  REPEAT EXAM: prn  eSigned:  Hart Carwin, MD 10/04/2013 11:50 AM   CC:  PATIENT NAME:  [name], [name] MR#: [id number]

## 2013-10-04 NOTE — Progress Notes (Signed)
Patient did not experience any of the following events: a burn prior to discharge; a fall within the facility; wrong site/side/patient/procedure/implant event; or a hospital transfer or hospital admission upon discharge from the facility. (G8907)Patient did not have preoperative order for IV antibiotic SSI prophylaxis. (G8918) ewm 

## 2013-10-04 NOTE — Progress Notes (Signed)
Lidocaine-40mg IV prior to Propofol InductionPropofol given over incremental dosages 

## 2013-10-04 NOTE — Progress Notes (Signed)
No egg or soy allergy. emw  With gastric bypass surgery had over dose with sedation and was on vent for 30 minutes post op. Has had surgeries after this with no issues. ewm

## 2013-10-04 NOTE — Op Note (Signed)
[facility] [address] [place], [address]   COLONOSCOPY PROCEDURE REPORT  PATIENT: Christy [name], Christy [name]  MR#: [id number] BIRTHDATE: 04/09/1956 , 57  yrs. old GENDER: Female ENDOSCOPIST: Hart Carwin, MD REFERRED AO:ZHYQ Susann Givens, M.D. PROCEDURE DATE:  10/04/2013 PROCEDURE:   Colonoscopy with snare polypectomy and Colonoscopy with cold biopsy polypectomy First Screening Colonoscopy - Avg.  risk and is 50 yrs.  old or older - No.  Prior Negative Screening - Now for repeat screening. N/A  History of Adenoma - Now for follow-up colonoscopy & has been > or = to 3 yrs.  Yes hx of adenoma.  Has been 3 or more years since last colonoscopy.  Polyps Removed Today? Yes. ASA CLASS:   Class II INDICATIONS:2008 colon- tubular adenoma, hemorrhoidal bleeding. MEDICATIONS: MAC sedation, administered by CRNA and propofol (Diprivan) 300mg  IV  DESCRIPTION OF PROCEDURE:   After the risks benefits and alternatives of the procedure were thoroughly explained, informed consent was obtained.  A digital rectal exam revealed no abnormalities of the rectum.   The LB PFC-H190 [id number]  endoscope was introduced through the anus and advanced to the cecum, which was identified by both the appendix and ileocecal valve. No adverse events experienced.   The quality of the prep was good, using MoviPrep  The instrument was then slowly withdrawn as the colon was fully examined.      COLON FINDINGS: Three polypoid shaped sessile polyps ranging between 3-75mm in size were found in the descending colon.  A polypectomy was performed with cold forceps and with a cold snare.  The resection was complete and the polyp tissue was completely retrieved.   Mild diverticulosis was noted in the sigmoid colon. Small internal hemorrhoids were found.  Retroflexed views revealed no abnormalities. The time to cecum=6 minutes 50 seconds. Withdrawal time=14 minutes 35 seconds.  The scope was withdrawn and the  procedure completed. COMPLICATIONS: There were no complications.  ENDOSCOPIC IMPRESSION: 1.   Three sessile polyps ranging between 3-96mm in size were found in the descending colon; polypectomy was performed with cold forceps and with a cold snare 2.   Mild diverticulosis was noted in the sigmoid colon 3.   Small internal hemorrhoids  RECOMMENDATIONS: 1.  Await pathology results 2.  High fiber diet 3.   Anusol HC supp 1 hs 4.  recall colon 5 years  eSigned:  Hart Carwin, MD 10/04/2013 11:56 AM   cc:   PATIENT NAME:  Christy [name], Christy [name] MR#: [id number]

## 2013-10-04 NOTE — Telephone Encounter (Signed)
Reason for Call    Advice Only           Call Documentation    Darlin Coco, RN at 10/03/2013 10:13 AM    Status: Signed             Patient ate a granola bar with peanuts in it for breakfastthis am. I told her to stick to the clear liquids all day today, and to take all of the prep. She agreed, and will be in tomorrow.

## 2013-10-05 ENCOUNTER — Telehealth: Payer: Self-pay | Admitting: *Deleted

## 2013-10-05 NOTE — Telephone Encounter (Signed)
No answer. Name identifier. Message left to call if any questions or concerns. 

## 2013-10-08 ENCOUNTER — Other Ambulatory Visit: Payer: Self-pay | Admitting: Family Medicine

## 2013-10-08 ENCOUNTER — Other Ambulatory Visit: Payer: Self-pay | Admitting: Oncology

## 2013-10-09 ENCOUNTER — Encounter: Payer: Self-pay | Admitting: Oncology

## 2013-10-09 ENCOUNTER — Other Ambulatory Visit (HOSPITAL_BASED_OUTPATIENT_CLINIC_OR_DEPARTMENT_OTHER): Payer: 59 | Admitting: Lab

## 2013-10-09 ENCOUNTER — Telehealth: Payer: Self-pay | Admitting: Family Medicine

## 2013-10-09 ENCOUNTER — Other Ambulatory Visit: Payer: Self-pay | Admitting: Family Medicine

## 2013-10-09 ENCOUNTER — Ambulatory Visit (HOSPITAL_BASED_OUTPATIENT_CLINIC_OR_DEPARTMENT_OTHER): Payer: 59 | Admitting: Oncology

## 2013-10-09 ENCOUNTER — Telehealth: Payer: Self-pay | Admitting: *Deleted

## 2013-10-09 ENCOUNTER — Other Ambulatory Visit: Payer: Self-pay

## 2013-10-09 VITALS — BP 151/78 | HR 92 | Temp 99.0°F | Resp 18 | Ht 65.0 in | Wt 184.0 lb

## 2013-10-09 DIAGNOSIS — C50419 Malignant neoplasm of upper-outer quadrant of unspecified female breast: Secondary | ICD-10-CM

## 2013-10-09 DIAGNOSIS — D509 Iron deficiency anemia, unspecified: Secondary | ICD-10-CM

## 2013-10-09 DIAGNOSIS — C50911 Malignant neoplasm of unspecified site of right female breast: Secondary | ICD-10-CM

## 2013-10-09 DIAGNOSIS — F900 Attention-deficit hyperactivity disorder, predominantly inattentive type: Secondary | ICD-10-CM

## 2013-10-09 DIAGNOSIS — Z170 Estrogen receptor positive status [ER+]: Secondary | ICD-10-CM

## 2013-10-09 DIAGNOSIS — Z853 Personal history of malignant neoplasm of breast: Secondary | ICD-10-CM

## 2013-10-09 DIAGNOSIS — M899 Disorder of bone, unspecified: Secondary | ICD-10-CM

## 2013-10-09 LAB — CBC WITH DIFFERENTIAL/PLATELET
BASO%: 1.1 % (ref 0.0–2.0)
Eosinophils Absolute: 0.2 10*3/uL (ref 0.0–0.5)
HGB: 14 g/dL (ref 11.6–15.9)
LYMPH%: 28.4 % (ref 14.0–49.7)
MCH: 29.5 pg (ref 25.1–34.0)
MCHC: 32.9 g/dL (ref 31.5–36.0)
MONO#: 0.4 10*3/uL (ref 0.1–0.9)
NEUT#: 3 10*3/uL (ref 1.5–6.5)
NEUT%: 57.9 % (ref 38.4–76.8)
Platelets: 153 10*3/uL (ref 145–400)
RBC: 4.74 10*6/uL (ref 3.70–5.45)
RDW: 13.1 % (ref 11.2–14.5)
WBC: 5.1 10*3/uL (ref 3.9–10.3)
lymph#: 1.5 10*3/uL (ref 0.9–3.3)

## 2013-10-09 LAB — COMPREHENSIVE METABOLIC PANEL (CC13)
ALT: 27 U/L (ref 0–55)
AST: 22 U/L (ref 5–34)
Albumin: 3.6 g/dL (ref 3.5–5.0)
Anion Gap: 7 mEq/L (ref 3–11)
CO2: 26 mEq/L (ref 22–29)
Calcium: 9.2 mg/dL (ref 8.4–10.4)
Chloride: 106 mEq/L (ref 98–109)
Creatinine: 0.7 mg/dL (ref 0.6–1.1)
Glucose: 95 mg/dl (ref 70–140)
Potassium: 4.2 mEq/L (ref 3.5–5.1)
Sodium: 139 mEq/L (ref 136–145)
Total Protein: 6.7 g/dL (ref 6.4–8.3)

## 2013-10-09 LAB — FERRITIN CHCC: Ferritin: 50 ng/ml (ref 9–269)

## 2013-10-09 MED ORDER — CYCLOBENZAPRINE HCL 10 MG PO TABS: 10.0000 mg | ORAL_TABLET | Freq: Every evening | ORAL | Status: DC | PRN

## 2013-10-09 MED ORDER — LISDEXAMFETAMINE DIMESYLATE 60 MG PO CAPS: 60.0000 mg | ORAL_CAPSULE | ORAL | Status: DC

## 2013-10-09 NOTE — Progress Notes (Signed)
OFFICE PROGRESS NOTE   10/09/2013   Physicians:J.Lalonde, C.Streck, (J.Tomblin), V.Paul, D.Brodie   INTERVAL HISTORY:  Patient is seen, alone for visit, in 6 month follow up of her history of 2 primary right breast cancers, for which she continues adjuvant Femara, this begun Aug 2008. Repeat bone density scan 10-04-13 has further decrease in density, tho still in osteopenic range. She is on Vitamin D and gets some calcium in 3 multivitamins daily; she has not yet begun Boniva by Dr Renae Fickle as she was waiting for endoscopies just done. She has not had dental evaluation, which was part of the concern with bisphosphonate when I saw her last.  Upper endoscopy and colonoscopy by Dr Juanda Chance found no ulcerations, biopsies pending from some gastric erythema and 3 small sessile colon polyps. GERD is much better on zantac. Hemorrhoidal bleeding in August is better with interventions by GI. Right knee is still painful and unstable from orthopedic problems. She expects knee replacement after ~ 4 months on the Boniva. She has occasional muscle spasms in back as she has had since breast surgery, uses prn Flexeril for this and occasional leg cramps. No noted changes in reconstructed breasts. No bleeding.   She had IV feraheme 04-06-2013, tolerated well. Ferritin prior to the IV iron was 3.   ONCOLOGIC HISTORY History is of 2 primary right breast cancers, in 1993 and May 2008. The initial right breast cancer was stage 2 with 3 of 41 nodes involved, ER/PR negative and HER-2 not done in 1993. She was treated by Dr.John Durward Fortes on NSABP B25 with adriamycin/cytoxan. The most recent breast cancer was 0.6 cm invasive ductal with 3 nodes negative, ER/PR + and HER2 -. She had right mastectomy and prophylactic left mastectomy, with bilateral reconstructions. She additionally had hysterectomy with BSO. She has been on Femara since Aug 2008, which she is tolerating well. The bone density scan 10-04-13 has T scores -2.1 and -2.2, having  been -1.7 and -1.9 in 2013.Marland Kitchen She is on calcium in multivitamins (does not know how much) and Vitamin D, is not getting any regular exercise primarily due to limitations from knee, is to begin Boniva.    Review of systems as above, also: No fever or symptoms of infection. No pain thru spine or new/ different pain otherwise. No SOB or other respiratory symptoms. Bladder ok. No swelling LE. 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  Younger son Christy [name] is Printmaker at [employer]  Objective:  Vital signs in last 24 hours:  BP 151/78  Pulse 92  Temp(Src) 99 F (37.2 C) (Oral)  Resp 18  Ht 5\' 5"  (1.651 m)  Wt 184 lb (83.462 kg)  BMI 30.62 kg/m2  SpO2 97% Weight is up 7 lbs Alert, oriented and appropriate. Ambulatory without assistance using knee brace and cane.   HEENT:PERRL, sclerae not icteric. Oral mucosa moist without lesions, posterior pharynx clear.  Neck supple. No JVD. No thyroid mass. Lymphatics:no cervical,suraclavicular, axillary or inguinal adenopathy Resp: clear to auscultation bilaterally and normal percussion bilaterally Cardio: regular rate and rhythm. No gallop. GI: soft, nontender, not distended, no mass or organomegaly. Normally active bowel sounds.  Musculoskeletal/ Extremities: without pitting edema, cords, tenderness, back not tender.  Neuro/ psych: CN, motor, sensory, cerebellar nonfocal Alert, oriented and appropriate.  Skin without rash, ecchymosis, petechiae Breasts: bilateral reconstructions without findings f concern for local recurrence.l Axillae benign.   Lab Results:  Results for orders placed in  visit on 10/09/13  CBC WITH DIFFERENTIAL      Result Value Range   WBC 5.1  3.9 - 10.3 10e3/uL   NEUT# 3.0  1.5 - 6.5 10e3/uL   HGB 14.0  11.6 - 15.9 g/dL   HCT 40.9  81.1 - 91.4 %   Platelets 153  145 - 400 10e3/uL   MCV 89.5  79.5 - 101.0 fL   MCH 29.5  25.1 -  34.0 pg   MCHC 32.9  31.5 - 36.0 g/dL   RBC 7.82  9.56 - 2.13 10e6/uL   RDW 13.1  11.2 - 14.5 %   lymph# 1.5  0.9 - 3.3 10e3/uL   MONO# 0.4  0.1 - 0.9 10e3/uL   Eosinophils Absolute 0.2  0.0 - 0.5 10e3/uL   Basophils Absolute 0.1  0.0 - 0.1 10e3/uL   NEUT% 57.9  38.4 - 76.8 %   LYMPH% 28.4  14.0 - 49.7 %   MONO% 8.4  0.0 - 14.0 %   EOS% 4.2  0.0 - 7.0 %   BASO% 1.1  0.0 - 2.0 %  COMPREHENSIVE METABOLIC PANEL (CC13)      Result Value Range   Sodium 139  136 - 145 mEq/L   Potassium 4.2  3.5 - 5.1 mEq/L   Chloride 106  98 - 109 mEq/L   CO2 26  22 - 29 mEq/L   Glucose 95  70 - 140 mg/dl   BUN 8.9  7.0 - 08.6 mg/dL   Creatinine 0.7  0.6 - 1.1 mg/dL   Total Bilirubin 5.78  0.20 - 1.20 mg/dL   Alkaline Phosphatase 123  40 - 150 U/L   AST 22  5 - 34 U/L   ALT 27  0 - 55 U/L   Total Protein 6.7  6.4 - 8.3 g/dL   Albumin 3.6  3.5 - 5.0 g/dL   Calcium 9.2  8.4 - 46.9 mg/dL   Anion Gap 7  3 - 11 mEq/L     Studies/Results: DUAL X-RAY ABSORPTIOMETRY (DXA) FOR BONE MINERAL DENSITY  AP LUMBAR SPINE L1, L3, L4  Bone Mineral Density (BMD): 0.818 g/cm2  Young Adult T Score: -2.1  Z Score: -0.9  LEFT FEMUR NECK  Bone Mineral Density (BMD): 0.602 g/cm2  Young Adult T Score: -2.2  Z Score: -1.1  ASSESSMENT: Patient's diagnostic category is LOW BONE MASS by WHO  Criteria.  FRACTURE RISK: INCREASED  FRAX: Based on the World Health Organization FRAX model, the 10  year probability of a major osteoporotic fracture is 8.7%. The 10  year probability of a hip fracture is 1.1%.  Comparison: Statistically significant decrease in bone mineral  density when compared with previous and baseline.    Medications: I have reviewed the patient's current medications. We will refill the flexeril that she has used on prn basis for muscle spasms.  DIscussion: we have discussed fact that she has been on aromatase inhibitor now x 6 years and has further decline in bone density. She understands that optimal  length of treatment is not yet known, but is reluctant to stop Femara even with the osteopenia. She plans to begin [facility] and wants to see if this improves the situation. She has had flu vaccine.  Assessment/Plan: 1.History of 2 primary right breast cancer as above: continuing Femara. I will see her back in 6 months or sooner if needed. She does not have mammograms done now. 2.Osteopenia preceeding aromatase inhibitor: repeat bone density with progressive osteopenia. Risk factors  include ongoing aromatase inhibitor, gastric bypass, lack of exercise. To begin oral bisphosphonate 4.hysterectomy with oophorectomy  5.hypothyroidism on replacement  6.orthopedic problems with knee: hopes to have knee replacement after some perior of time on Boniva.  7.prophylactic left mastectomy, bilateral breast reconstructions 8.work related stress 9.she has had flu vaccine 10.gastric bypass I will see her back in 6 months or sooner if needed.  LIVESAY,Christy P, MD   10/09/2013, 1:29 PM

## 2013-10-09 NOTE — Telephone Encounter (Signed)
appts made and printed. Pt is aware that LL template is not opened yet but we will call her w/ appts for 03/2014...td

## 2013-10-09 NOTE — Patient Instructions (Signed)
Calcium 1200 mg daily + Vit D Let your dentist look at things Good luck with Boniva!

## 2013-10-09 NOTE — Telephone Encounter (Signed)
lm

## 2013-10-10 ENCOUNTER — Encounter: Payer: Self-pay | Admitting: Internal Medicine

## 2013-11-02 ENCOUNTER — Other Ambulatory Visit: Payer: Self-pay

## 2013-11-17 ENCOUNTER — Telehealth: Payer: Self-pay | Admitting: Family Medicine

## 2013-11-17 NOTE — Telephone Encounter (Signed)
lm

## 2013-11-27 ENCOUNTER — Other Ambulatory Visit: Payer: Self-pay | Admitting: Orthopedic Surgery

## 2013-12-04 ENCOUNTER — Encounter (HOSPITAL_COMMUNITY): Payer: Self-pay | Admitting: Pharmacy Technician

## 2013-12-07 ENCOUNTER — Inpatient Hospital Stay (HOSPITAL_COMMUNITY): Admission: RE | Admit: 2013-12-07 | Payer: 59 | Source: Ambulatory Visit

## 2013-12-13 ENCOUNTER — Encounter (HOSPITAL_COMMUNITY)
Admission: RE | Admit: 2013-12-13 | Discharge: 2013-12-13 | Disposition: A | Discharge status: Home / Self Care | Payer: 59 | Source: Ambulatory Visit | Attending: Orthopedic Surgery | Admitting: Orthopedic Surgery

## 2013-12-13 DIAGNOSIS — Z01812 Encounter for preprocedural laboratory examination: Secondary | ICD-10-CM | POA: Insufficient documentation

## 2013-12-13 LAB — URINALYSIS W MICROSCOPIC + REFLEX CULTURE
Bilirubin Urine: NEGATIVE
Hgb urine dipstick: NEGATIVE
Ketones, ur: NEGATIVE mg/dL
Nitrite: NEGATIVE
Protein, ur: NEGATIVE mg/dL
RBC / HPF: NONE SEEN RBC/hpf (ref <3)
Specific Gravity, Urine: 1.025 (ref 1.005–1.030)
Urobilinogen, UA: 0.2 mg/dL (ref 0.0–1.0)
WBC, UA: NONE SEEN WBC/hpf (ref <3)
pH: 5.5 (ref 5.0–8.0)

## 2013-12-13 LAB — CBC WITH DIFFERENTIAL/PLATELET
Basophils Relative: 1 % (ref 0–1)
Eosinophils Absolute: 0.3 10*3/uL (ref 0.0–0.7)
HCT: 42 % (ref 36.0–46.0)
Hemoglobin: 14.2 g/dL (ref 12.0–15.0)
Lymphocytes Relative: 31 % (ref 12–46)
Lymphs Abs: 1.6 10*3/uL (ref 0.7–4.0)
MCH: 30.6 pg (ref 26.0–34.0)
MCHC: 33.8 g/dL (ref 30.0–36.0)
MCV: 90.5 fL (ref 78.0–100.0)
Monocytes Absolute: 0.5 10*3/uL (ref 0.1–1.0)
Monocytes Relative: 10 % (ref 3–12)
Neutrophils Relative %: 52 % (ref 43–77)
RBC: 4.64 MIL/uL (ref 3.87–5.11)

## 2013-12-13 LAB — SURGICAL PCR SCREEN
MRSA, PCR: POSITIVE — AB
Staphylococcus aureus: POSITIVE — AB

## 2013-12-13 LAB — BASIC METABOLIC PANEL
BUN: 10 mg/dL (ref 6–23)
CO2: 26 mEq/L (ref 19–32)
Chloride: 103 mEq/L (ref 96–112)
GFR calc non Af Amer: >90 mL/min (ref >90)
Glucose, Bld: 80 mg/dL (ref 70–99)
Potassium: 3.8 mEq/L (ref 3.5–5.1)
Sodium: 139 mEq/L (ref 135–145)

## 2013-12-13 LAB — TYPE AND SCREEN: Antibody Screen: NEGATIVE

## 2013-12-13 LAB — PROTIME-INR: INR: 0.89 (ref 0.00–1.49)

## 2013-12-13 LAB — ABO/RH: ABO/RH(D): O POS

## 2013-12-13 MED ORDER — CHLORHEXIDINE GLUCONATE 4 % EX LIQD: 60.0000 mL | Freq: Once | CUTANEOUS | Status: DC

## 2013-12-13 NOTE — Pre-Procedure Instructions (Signed)
Christy [name]  12/13/2013   Your procedure is scheduled on:  Monday, December 18, 2013 @ 7:30 AM  Report to [facility] Short Stay (use Main Entrance "A'') at 5:30 AM.  Call this number if you have problems the morning of surgery: [phone]   Remember:   Do not eat food or drink liquids after midnight.   Take these medicines the morning of surgery with A SIP OF WATER:  letrozole (FEMARA) 2.5 MG tablet levothyroxine (SYNTHROID, LEVOTHROID) 50 MCG tablet, venlafaxine XR (EFFEXOR-XR) 150 MG 24 hr capsule Stop taking Aspirin, vitamins and herbal medications. Do not take any NSAIDs ie: Ibuprofen, Advil, Naproxen or any medication containing Aspirin ( celecoxib (CELEBREX) 200 MG capsule).   Do not wear jewelry, make-up or nail polish.  Do not wear lotions, powders, or perfumes. You may wear deodorant.  Do not shave 48 hours prior to surgery.  Do not bring valuables to the hospital.  [facility] is not responsible  for any belongings or valuables.               Contacts, dentures or bridgework may not be worn into surgery.  Leave suitcase in the car. After surgery it may be brought to your room.  For patients admitted to the hospital, discharge time is determined by your treatment team.               Patients discharged the day of surgery will not be allowed to drive home.  Name and phone number of your driver:   Special Instructions: Shower using CHG 2 nights before surgery and the night before surgery.  If you shower the day of surgery use CHG.  Use special wash - you have one bottle of CHG for all showers.  You should use approximately 1/3 of the bottle for each shower.   Please read over the following fact sheets that you were given: Pain Booklet, Coughing and Deep Breathing, Blood Transfusion Information, Total Joint Packet, MRSA Information and Surgical Site Infection Prevention,  and Incentive Spirometer

## 2013-12-15 NOTE — H&P (Signed)
TOTAL KNEE ADMISSION H&P  Patient is being admitted for right total knee arthroplasty.  Subjective:  Chief Complaint:right knee pain.  HPI: Christy [name], 57 y.o. female, has a history of pain and functional disability in the right knee due to arthritis and has failed non-surgical conservative treatments for greater than 12 weeks to includeNSAID's and/or analgesics, corticosteriod injections, viscosupplementation injections, flexibility and strengthening excercises and activity modification.  Onset of symptoms was gradual, starting >10 years ago with gradually worsening course since that time. The patient noted prior procedures on the knee to include  arthroscopy on the right knee(s).  Patient currently rates pain in the right knee(s) at 10 out of 10 with activity. Patient has night pain, worsening of pain with activity and weight bearing and pain that interferes with activities of daily living.  Patient has evidence of joint space narrowing by imaging studies.  There is no active infection.  Patient Active Problem List   Diagnosis Date Noted  . Rectal bleeding 07/18/2013  . 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 Iron deficiency anemia, unspecified 04/05/2013  . Dyspnea 02/01/2013  . H/O gastric bypass 03/06/2012  . Breast cancer 03/04/2012  . Osteopenia 03/04/2012   Past Medical History  Diagnosis Date  . Breast cancer   . Allergy     rhinitis  . Anemia   . ADD (attention deficit disorder)   . Obesity   . Renal calculi   . Hypothyroid   . Internal hemorrhoids   . Tubular adenoma of colon     Past Surgical History  Procedure Laterality Date  . Mastectomy Bilateral     with lymph node dissection  . Cesarean section      x 2  . Vaginal hysterectomy    . Gastric bypass    . Arthroscopic knee Right     x 3  . Lithotripsy    . Cholecystectomy    . Breast lumpectomy Right     No prescriptions prior to admission   No Known Allergies   History  Substance Use Topics  . Smoking status: Never Smoker   . Smokeless tobacco: Never Used  . Alcohol Use: Yes     Comment: occasional    Family History  Problem Relation Age of Onset  . COPD Father   . Breast cancer Paternal Aunt   . Colon polyps Mother   . Colon cancer Neg Hx   . Esophageal cancer Neg Hx   . Rectal cancer Neg Hx   . Stomach cancer Neg Hx      Review of Systems  Constitutional: Negative.   HENT: Negative.   Eyes: Negative.   Respiratory: Negative.   Cardiovascular: Negative.   Gastrointestinal: Negative.   Genitourinary: Negative.   Musculoskeletal: Positive for joint pain.  Skin: Negative.   Neurological: Negative.   Endo/Heme/Allergies: Negative.   Psychiatric/Behavioral: Negative.     Objective:  Physical Exam  Constitutional: She is oriented to person, place, and time. She appears well-developed and well-nourished.  HENT:  Head: Normocephalic and atraumatic.  Eyes: Pupils are equal, round, and reactive to light.  Neck: Normal range of motion. Neck supple.  Cardiovascular: Intact distal pulses.   Respiratory: Effort normal.  Musculoskeletal:  She is tender along the medial joint line and flexes 120 and grossly her collateral ligaments are stable.  She is neurovascularly intact.  Neurological: She is alert and oriented to person, place, and time.  Skin: Skin is warm and dry.  Psychiatric: She  has a normal mood and affect. Her behavior is normal. Judgment and thought content normal.    Vital signs in last 24 hours:    Labs:   Estimated body mass index is 30.62 kg/(m^2) as calculated from the following:   Height as of 10/09/13: 5\' 5"  (1.651 m).   Weight as of 10/09/13: 83.462 kg (184 lb).   Imaging Review 4 views right knee show increasing valgus with bone-on-bone and extraordinarily thin cortices of her femoral and tibia on both knees right greater than left.  Assessment/Plan:  End stage arthritis, right knee   The patient  history, physical examination, clinical judgment of the provider and imaging studies are consistent with end stage degenerative joint disease of the right knee(s) and total knee arthroplasty is deemed medically necessary. The treatment options including medical management, injection therapy arthroscopy and arthroplasty were discussed at length. The risks and benefits of total knee arthroplasty were presented and reviewed. The risks due to aseptic loosening, infection, stiffness, patella tracking problems, thromboembolic complications and other imponderables were discussed. The patient acknowledged the explanation, agreed to proceed with the plan and consent was signed. Patient is being admitted for inpatient treatment for surgery, pain control, PT, OT, prophylactic antibiotics, VTE prophylaxis, progressive ambulation and ADL's and discharge planning. The patient is planning to be discharged home with home health services

## 2013-12-17 MED ORDER — CEFAZOLIN SODIUM-DEXTROSE 2-3 GM-% IV SOLR
2.0000 g | INTRAVENOUS | Status: AC
  Administered 2013-12-18: 2 g via INTRAVENOUS
  Filled 2013-12-17: qty 50

## 2013-12-18 ENCOUNTER — Encounter (HOSPITAL_COMMUNITY): Payer: Self-pay | Admitting: *Deleted

## 2013-12-18 ENCOUNTER — Encounter (HOSPITAL_COMMUNITY): Payer: 59 | Admitting: Anesthesiology

## 2013-12-18 ENCOUNTER — Inpatient Hospital Stay (HOSPITAL_COMMUNITY): Payer: 59 | Admitting: Anesthesiology

## 2013-12-18 ENCOUNTER — Inpatient Hospital Stay (HOSPITAL_COMMUNITY)
Admission: RE | Admit: 2013-12-18 | Discharge: 2013-12-20 | DRG: 470 | Disposition: A | Discharge status: Home Health | Payer: 59 | Source: Ambulatory Visit | Attending: Orthopedic Surgery | Admitting: Orthopedic Surgery

## 2013-12-18 ENCOUNTER — Encounter (HOSPITAL_COMMUNITY)
Admission: RE | Disposition: A | Discharge status: Home Health | Payer: Self-pay | Source: Ambulatory Visit | Attending: Orthopedic Surgery

## 2013-12-18 DIAGNOSIS — F909 Attention-deficit hyperactivity disorder, unspecified type: Secondary | ICD-10-CM | POA: Diagnosis present

## 2013-12-18 DIAGNOSIS — N289 Disorder of kidney and ureter, unspecified: Secondary | ICD-10-CM | POA: Diagnosis present

## 2013-12-18 DIAGNOSIS — M899 Disorder of bone, unspecified: Secondary | ICD-10-CM | POA: Diagnosis present

## 2013-12-18 DIAGNOSIS — Z9884 Bariatric surgery status: Secondary | ICD-10-CM

## 2013-12-18 DIAGNOSIS — Z01812 Encounter for preprocedural laboratory examination: Secondary | ICD-10-CM

## 2013-12-18 DIAGNOSIS — Z853 Personal history of malignant neoplasm of breast: Secondary | ICD-10-CM | POA: Diagnosis present

## 2013-12-18 DIAGNOSIS — D509 Iron deficiency anemia, unspecified: Secondary | ICD-10-CM | POA: Diagnosis present

## 2013-12-18 DIAGNOSIS — M1710 Unilateral primary osteoarthritis, unspecified knee: Principal | ICD-10-CM | POA: Diagnosis present

## 2013-12-18 DIAGNOSIS — E039 Hypothyroidism, unspecified: Secondary | ICD-10-CM | POA: Diagnosis present

## 2013-12-18 DIAGNOSIS — M1711 Unilateral primary osteoarthritis, right knee: Secondary | ICD-10-CM

## 2013-12-18 HISTORY — PX: TOTAL KNEE ARTHROPLASTY: SHX125

## 2013-12-18 SURGERY — ARTHROPLASTY, KNEE, TOTAL
Anesthesia: Regional | Site: Knee | Laterality: Right

## 2013-12-18 MED ORDER — ARTIFICIAL TEARS OP OINT
TOPICAL_OINTMENT | OPHTHALMIC | Status: DC | PRN
  Administered 2013-12-18: 1 via OPHTHALMIC

## 2013-12-18 MED ORDER — ROCURONIUM BROMIDE 100 MG/10ML IV SOLN
INTRAVENOUS | Status: DC | PRN
  Administered 2013-12-18: 40 mg via INTRAVENOUS

## 2013-12-18 MED ORDER — VITAMIN D3 25 MCG (1000 UNIT) PO TABS
2000.0000 [IU] | ORAL_TABLET | Freq: Every day | ORAL | Status: DC
  Administered 2013-12-18 – 2013-12-19 (×2): 2000 [IU] via ORAL
  Filled 2013-12-18 (×4): qty 2

## 2013-12-18 MED ORDER — METHOCARBAMOL 100 MG/ML IJ SOLN
500.0000 mg | INTRAVENOUS | Status: AC
  Administered 2013-12-18: 500 mg via INTRAVENOUS
  Filled 2013-12-18: qty 5

## 2013-12-18 MED ORDER — HYDROMORPHONE HCL PF 1 MG/ML IJ SOLN
1.0000 mg | INTRAMUSCULAR | Status: DC | PRN
  Administered 2013-12-18 – 2013-12-19 (×8): 1 mg via INTRAVENOUS
  Filled 2013-12-18 (×8): qty 1

## 2013-12-18 MED ORDER — LIDOCAINE HCL (CARDIAC) 20 MG/ML IV SOLN
INTRAVENOUS | Status: DC | PRN
  Administered 2013-12-18: 60 mg via INTRAVENOUS

## 2013-12-18 MED ORDER — MIDAZOLAM HCL 2 MG/2ML IJ SOLN: 0.5000 mg | Freq: Once | INTRAMUSCULAR | Status: DC | PRN

## 2013-12-18 MED ORDER — BUPIVACAINE-EPINEPHRINE PF 0.5-1:200000 % IJ SOLN
INTRAMUSCULAR | Status: DC | PRN
  Administered 2013-12-18: 20 mL

## 2013-12-18 MED ORDER — DOCUSATE SODIUM 100 MG PO CAPS
100.0000 mg | ORAL_CAPSULE | Freq: Two times a day (BID) | ORAL | Status: DC
  Administered 2013-12-18 – 2013-12-19 (×4): 100 mg via ORAL
  Filled 2013-12-18 (×6): qty 1

## 2013-12-18 MED ORDER — GLYCOPYRROLATE 0.2 MG/ML IJ SOLN
INTRAMUSCULAR | Status: DC | PRN
  Administered 2013-12-18: 0.6 mg via INTRAVENOUS

## 2013-12-18 MED ORDER — FENTANYL CITRATE 0.05 MG/ML IJ SOLN
25.0000 ug | INTRAMUSCULAR | Status: DC | PRN
  Administered 2013-12-18 (×2): 50 ug via INTRAVENOUS

## 2013-12-18 MED ORDER — MENTHOL 3 MG MT LOZG: 1.0000 | LOZENGE | OROMUCOSAL | Status: DC | PRN

## 2013-12-18 MED ORDER — ONDANSETRON HCL 4 MG PO TABS
4.0000 mg | ORAL_TABLET | Freq: Four times a day (QID) | ORAL | Status: DC | PRN
  Administered 2013-12-20: 4 mg via ORAL
  Filled 2013-12-18: qty 1

## 2013-12-18 MED ORDER — PROPOFOL 10 MG/ML IV BOLUS
INTRAVENOUS | Status: DC | PRN
  Administered 2013-12-18: 150 mg via INTRAVENOUS

## 2013-12-18 MED ORDER — PROMETHAZINE HCL 25 MG/ML IJ SOLN: 6.2500 mg | INTRAMUSCULAR | Status: DC | PRN

## 2013-12-18 MED ORDER — ASPIRIN EC 325 MG PO TBEC
325.0000 mg | DELAYED_RELEASE_TABLET | Freq: Every day | ORAL | Status: DC
  Administered 2013-12-19 – 2013-12-20 (×2): 325 mg via ORAL
  Filled 2013-12-18 (×3): qty 1

## 2013-12-18 MED ORDER — HYDROMORPHONE HCL PF 1 MG/ML IJ SOLN
INTRAMUSCULAR | Status: AC
  Filled 2013-12-18: qty 1

## 2013-12-18 MED ORDER — ONDANSETRON HCL 4 MG/2ML IJ SOLN
INTRAMUSCULAR | Status: DC | PRN
  Administered 2013-12-18: 4 mg via INTRAVENOUS

## 2013-12-18 MED ORDER — LISDEXAMFETAMINE DIMESYLATE 30 MG PO CAPS
60.0000 mg | ORAL_CAPSULE | Freq: Every day | ORAL | Status: DC
  Administered 2013-12-19: 60 mg via ORAL
  Filled 2013-12-18 (×2): qty 2

## 2013-12-18 MED ORDER — LETROZOLE 2.5 MG PO TABS
2.5000 mg | ORAL_TABLET | Freq: Every day | ORAL | Status: DC
  Administered 2013-12-19: 2.5 mg via ORAL
  Filled 2013-12-18 (×2): qty 1

## 2013-12-18 MED ORDER — CEFUROXIME SODIUM 1.5 G IJ SOLR
INTRAMUSCULAR | Status: AC
  Filled 2013-12-18: qty 1.5

## 2013-12-18 MED ORDER — OXYCODONE HCL 5 MG PO TABS
5.0000 mg | ORAL_TABLET | ORAL | Status: DC | PRN
  Administered 2013-12-18 – 2013-12-19 (×5): 10 mg via ORAL
  Administered 2013-12-19: 5 mg via ORAL
  Administered 2013-12-19 – 2013-12-20 (×4): 10 mg via ORAL
  Filled 2013-12-18 (×9): qty 2

## 2013-12-18 MED ORDER — LEVOTHYROXINE SODIUM 50 MCG PO TABS
50.0000 ug | ORAL_TABLET | Freq: Every day | ORAL | Status: DC
  Administered 2013-12-19 – 2013-12-20 (×2): 50 ug via ORAL
  Filled 2013-12-18 (×3): qty 1

## 2013-12-18 MED ORDER — VENLAFAXINE HCL ER 150 MG PO CP24
150.0000 mg | ORAL_CAPSULE | Freq: Every day | ORAL | Status: DC
  Administered 2013-12-19 – 2013-12-20 (×2): 150 mg via ORAL
  Filled 2013-12-18 (×3): qty 1

## 2013-12-18 MED ORDER — METHOCARBAMOL 500 MG PO TABS
500.0000 mg | ORAL_TABLET | Freq: Four times a day (QID) | ORAL | Status: DC | PRN
  Administered 2013-12-19 – 2013-12-20 (×3): 500 mg via ORAL
  Filled 2013-12-18 (×5): qty 1

## 2013-12-18 MED ORDER — KCL IN DEXTROSE-NACL 20-5-0.45 MEQ/L-%-% IV SOLN
INTRAVENOUS | Status: DC
  Administered 2013-12-18 – 2013-12-19 (×2): via INTRAVENOUS
  Filled 2013-12-18 (×8): qty 1000

## 2013-12-18 MED ORDER — DEXTROSE-NACL 5-0.45 % IV SOLN: INTRAVENOUS | Status: DC

## 2013-12-18 MED ORDER — TRANEXAMIC ACID 100 MG/ML IV SOLN
1000.0000 mg | INTRAVENOUS | Status: AC
  Administered 2013-12-18: 1000 mg via INTRAVENOUS
  Filled 2013-12-18: qty 10

## 2013-12-18 MED ORDER — PHENOL 1.4 % MT LIQD: 1.0000 | OROMUCOSAL | Status: DC | PRN

## 2013-12-18 MED ORDER — MAGNESIUM CITRATE PO SOLN: 1.0000 | Freq: Once | ORAL | Status: AC | PRN

## 2013-12-18 MED ORDER — MEPERIDINE HCL 25 MG/ML IJ SOLN: 6.2500 mg | INTRAMUSCULAR | Status: DC | PRN

## 2013-12-18 MED ORDER — ONDANSETRON HCL 4 MG/2ML IJ SOLN
4.0000 mg | Freq: Four times a day (QID) | INTRAMUSCULAR | Status: DC | PRN
  Filled 2013-12-18: qty 2

## 2013-12-18 MED ORDER — FAMOTIDINE 20 MG PO TABS
20.0000 mg | ORAL_TABLET | Freq: Two times a day (BID) | ORAL | Status: DC
  Administered 2013-12-18 – 2013-12-19 (×3): 20 mg via ORAL
  Filled 2013-12-18 (×5): qty 1

## 2013-12-18 MED ORDER — SENNOSIDES-DOCUSATE SODIUM 8.6-50 MG PO TABS: 1.0000 | ORAL_TABLET | Freq: Every evening | ORAL | Status: DC | PRN

## 2013-12-18 MED ORDER — ALUM & MAG HYDROXIDE-SIMETH 200-200-20 MG/5ML PO SUSP
30.0000 mL | ORAL | Status: DC | PRN
  Administered 2013-12-18 – 2013-12-20 (×4): 30 mL via ORAL
  Filled 2013-12-18 (×4): qty 30

## 2013-12-18 MED ORDER — ASPIRIN EC 325 MG PO TBEC: 325.0000 mg | DELAYED_RELEASE_TABLET | Freq: Two times a day (BID) | ORAL | Status: DC

## 2013-12-18 MED ORDER — DEXAMETHASONE SODIUM PHOSPHATE 4 MG/ML IJ SOLN
INTRAMUSCULAR | Status: DC | PRN
  Administered 2013-12-18: 4 mg via INTRAVENOUS

## 2013-12-18 MED ORDER — LISDEXAMFETAMINE DIMESYLATE 30 MG PO CAPS: 60.0000 mg | ORAL_CAPSULE | ORAL | Status: DC

## 2013-12-18 MED ORDER — METHOCARBAMOL 100 MG/ML IJ SOLN
500.0000 mg | Freq: Four times a day (QID) | INTRAMUSCULAR | Status: DC | PRN
  Filled 2013-12-18: qty 5

## 2013-12-18 MED ORDER — BISACODYL 5 MG PO TBEC: 5.0000 mg | DELAYED_RELEASE_TABLET | Freq: Every day | ORAL | Status: DC | PRN

## 2013-12-18 MED ORDER — NEOSTIGMINE METHYLSULFATE 1 MG/ML IJ SOLN
INTRAMUSCULAR | Status: DC | PRN
  Administered 2013-12-18: 4 mg via INTRAVENOUS

## 2013-12-18 MED ORDER — DIPHENHYDRAMINE HCL 12.5 MG/5ML PO ELIX
12.5000 mg | ORAL_SOLUTION | ORAL | Status: DC | PRN
  Administered 2013-12-18: 12.5 mg via ORAL
  Filled 2013-12-18 (×2): qty 10

## 2013-12-18 MED ORDER — FENTANYL CITRATE 0.05 MG/ML IJ SOLN
INTRAMUSCULAR | Status: DC | PRN
  Administered 2013-12-18: 150 ug via INTRAVENOUS
  Administered 2013-12-18 (×2): 50 ug via INTRAVENOUS
  Administered 2013-12-18 (×2): 25 ug via INTRAVENOUS
  Administered 2013-12-18: 100 ug via INTRAVENOUS
  Administered 2013-12-18 (×2): 50 ug via INTRAVENOUS

## 2013-12-18 MED ORDER — BUPIVACAINE LIPOSOME 1.3 % IJ SUSP
20.0000 mL | Freq: Once | INTRAMUSCULAR | Status: DC
  Filled 2013-12-18: qty 20

## 2013-12-18 MED ORDER — METOCLOPRAMIDE HCL 5 MG/ML IJ SOLN: 5.0000 mg | Freq: Three times a day (TID) | INTRAMUSCULAR | Status: DC | PRN

## 2013-12-18 MED ORDER — FENTANYL CITRATE 0.05 MG/ML IJ SOLN
INTRAMUSCULAR | Status: AC
  Filled 2013-12-18: qty 2

## 2013-12-18 MED ORDER — METOCLOPRAMIDE HCL 10 MG PO TABS: 5.0000 mg | ORAL_TABLET | Freq: Three times a day (TID) | ORAL | Status: DC | PRN

## 2013-12-18 MED ORDER — CEFUROXIME SODIUM 1.5 G IJ SOLR
INTRAMUSCULAR | Status: DC | PRN
  Administered 2013-12-18: 1.5 g

## 2013-12-18 MED ORDER — SODIUM CHLORIDE 0.9 % IJ SOLN
INTRAMUSCULAR | Status: DC | PRN
  Administered 2013-12-18: 08:00:00

## 2013-12-18 MED ORDER — LACTATED RINGERS IV SOLN
INTRAVENOUS | Status: DC | PRN
  Administered 2013-12-18 (×2): via INTRAVENOUS

## 2013-12-18 MED ORDER — OXYCODONE-ACETAMINOPHEN 5-325 MG PO TABS: 1.0000 | ORAL_TABLET | ORAL | Status: DC | PRN

## 2013-12-18 MED ORDER — METHOCARBAMOL 500 MG PO TABS: 500.0000 mg | ORAL_TABLET | Freq: Two times a day (BID) | ORAL | Status: DC

## 2013-12-18 MED ORDER — CHOLECALCIFEROL 125 MCG (5000 UT) PO CAPS: 2000.0000 [IU] | ORAL_CAPSULE | Freq: Every day | ORAL | Status: DC

## 2013-12-18 MED ORDER — ACETAMINOPHEN 650 MG RE SUPP: 650.0000 mg | Freq: Four times a day (QID) | RECTAL | Status: DC | PRN

## 2013-12-18 MED ORDER — ACETAMINOPHEN 325 MG PO TABS
650.0000 mg | ORAL_TABLET | Freq: Four times a day (QID) | ORAL | Status: DC | PRN
  Administered 2013-12-19: 650 mg via ORAL
  Filled 2013-12-18: qty 2

## 2013-12-18 MED ORDER — SODIUM CHLORIDE 0.9 % IR SOLN
Status: DC | PRN
  Administered 2013-12-18: 1000 mL

## 2013-12-18 MED ORDER — MIDAZOLAM HCL 5 MG/5ML IJ SOLN
INTRAMUSCULAR | Status: DC | PRN
  Administered 2013-12-18: 2 mg via INTRAVENOUS

## 2013-12-18 SURGICAL SUPPLY — 67 items
BANDAGE ESMARK 6X9 LF (GAUZE/BANDAGES/DRESSINGS) ×1 IMPLANT
BLADE SAG 18X100X1.27 (BLADE) ×2 IMPLANT
BLADE SAW SGTL 13X75X1.27 (BLADE) ×2 IMPLANT
BLADE SURG ROTATE 9660 (MISCELLANEOUS) IMPLANT
BNDG CMPR 9X6 STRL LF SNTH (GAUZE/BANDAGES/DRESSINGS) ×1
BNDG CMPR MED 10X6 ELC LF (GAUZE/BANDAGES/DRESSINGS) ×1
BNDG ELASTIC 6X10 VLCR STRL LF (GAUZE/BANDAGES/DRESSINGS) ×2 IMPLANT
BNDG ESMARK 6X9 LF (GAUZE/BANDAGES/DRESSINGS) ×2
BOWL SMART MIX CTS (DISPOSABLE) ×2 IMPLANT
CAPT RP KNEE ×1 IMPLANT
CEMENT HV SMART SET (Cement) ×4 IMPLANT
CLOTH BEACON ORANGE TIMEOUT ST (SAFETY) ×1 IMPLANT
COVER SURGICAL LIGHT HANDLE (MISCELLANEOUS) ×2 IMPLANT
CUFF TOURNIQUET SINGLE 34IN LL (TOURNIQUET CUFF) ×1 IMPLANT
CUFF TOURNIQUET SINGLE 44IN (TOURNIQUET CUFF) IMPLANT
DRAPE EXTREMITY T 121X128X90 (DRAPE) ×2 IMPLANT
DRAPE U-SHAPE 47X51 STRL (DRAPES) ×2 IMPLANT
DURAPREP 26ML APPLICATOR (WOUND CARE) ×2 IMPLANT
ELECT REM PT RETURN 9FT ADLT (ELECTROSURGICAL) ×2
ELECTRODE REM PT RTRN 9FT ADLT (ELECTROSURGICAL) ×1 IMPLANT
EVACUATOR 1/8 PVC DRAIN (DRAIN) ×2 IMPLANT
GAUZE XEROFORM 1X8 LF (GAUZE/BANDAGES/DRESSINGS) ×2 IMPLANT
GLOVE BIO SURGEON STRL SZ7.5 (GLOVE) ×2 IMPLANT
GLOVE BIO SURGEON STRL SZ8 (GLOVE) ×1 IMPLANT
GLOVE BIO SURGEON STRL SZ8.5 (GLOVE) ×3 IMPLANT
GLOVE BIOGEL PI IND STRL 6.5 (GLOVE) IMPLANT
GLOVE BIOGEL PI IND STRL 7.0 (GLOVE) IMPLANT
GLOVE BIOGEL PI IND STRL 8 (GLOVE) ×2 IMPLANT
GLOVE BIOGEL PI IND STRL 9 (GLOVE) ×1 IMPLANT
GLOVE BIOGEL PI INDICATOR 6.5 (GLOVE) ×1
GLOVE BIOGEL PI INDICATOR 7.0 (GLOVE) ×1
GLOVE BIOGEL PI INDICATOR 8 (GLOVE) ×1
GLOVE BIOGEL PI INDICATOR 9 (GLOVE) ×1
GLOVE ECLIPSE 6.5 STRL STRAW (GLOVE) ×1 IMPLANT
GLOVE SURG SS PI 6.5 STRL IVOR (GLOVE) ×1 IMPLANT
GOWN PREVENTION PLUS XLARGE (GOWN DISPOSABLE) ×2 IMPLANT
GOWN STRL NON-REIN LRG LVL3 (GOWN DISPOSABLE) ×2 IMPLANT
GOWN STRL REIN XL XLG (GOWN DISPOSABLE) ×4 IMPLANT
HANDPIECE INTERPULSE COAX TIP (DISPOSABLE) ×2
HOOD PEEL AWAY FACE SHEILD DIS (HOOD) ×5 IMPLANT
KIT BASIN OR (CUSTOM PROCEDURE TRAY) ×2 IMPLANT
KIT ROOM TURNOVER OR (KITS) ×2 IMPLANT
MANIFOLD NEPTUNE II (INSTRUMENTS) ×2 IMPLANT
NDL SAFETY ECLIPSE 18X1.5 (NEEDLE) IMPLANT
NDL SPNL 18GX3.5 QUINCKE PK (NEEDLE) IMPLANT
NEEDLE HYPO 18GX1.5 SHARP (NEEDLE)
NEEDLE SPNL 18GX3.5 QUINCKE PK (NEEDLE) ×2 IMPLANT
NS IRRIG 1000ML POUR BTL (IV SOLUTION) ×2 IMPLANT
PACK TOTAL JOINT (CUSTOM PROCEDURE TRAY) ×2 IMPLANT
PAD ARMBOARD 7.5X6 YLW CONV (MISCELLANEOUS) ×4 IMPLANT
PADDING CAST COTTON 6X4 STRL (CAST SUPPLIES) ×2 IMPLANT
SET HNDPC FAN SPRY TIP SCT (DISPOSABLE) ×1 IMPLANT
SPONGE GAUZE 4X4 12PLY (GAUZE/BANDAGES/DRESSINGS) ×4 IMPLANT
SPONGE GAUZE 4X4 12PLY STER LF (GAUZE/BANDAGES/DRESSINGS) ×1 IMPLANT
STAPLER VISISTAT 35W (STAPLE) ×2 IMPLANT
SUCTION FRAZIER TIP 10 FR DISP (SUCTIONS) ×2 IMPLANT
SUT VIC AB 0 CTX 36 (SUTURE) ×2
SUT VIC AB 0 CTX36XBRD ANTBCTR (SUTURE) ×1 IMPLANT
SUT VIC AB 1 CTX 36 (SUTURE) ×2
SUT VIC AB 1 CTX36XBRD ANBCTR (SUTURE) ×1 IMPLANT
SUT VIC AB 2-0 CT1 27 (SUTURE) ×2
SUT VIC AB 2-0 CT1 TAPERPNT 27 (SUTURE) ×1 IMPLANT
SYR 50ML LL SCALE MARK (SYRINGE) ×2 IMPLANT
TOWEL OR 17X24 6PK STRL BLUE (TOWEL DISPOSABLE) ×2 IMPLANT
TOWEL OR 17X26 10 PK STRL BLUE (TOWEL DISPOSABLE) ×2 IMPLANT
TRAY FOLEY CATH 14FR (SET/KITS/TRAYS/PACK) IMPLANT
WATER STERILE IRR 1000ML POUR (IV SOLUTION) ×2 IMPLANT

## 2013-12-18 NOTE — Anesthesia Postprocedure Evaluation (Signed)
  Anesthesia Post Note  Patient: Christy [name]  Procedure(s) Performed: Procedure(s) (LRB): TOTAL KNEE ARTHROPLASTY (Right)  Anesthesia type: GA  Patient location: PACU  Post pain: Pain level controlled  Post assessment: Post-op Vital signs reviewed  Last Vitals:  Filed Vitals:   12/18/13 0942  BP:   Pulse:   Temp: 36.8 C  Resp:     Post vital signs: Reviewed  Level of consciousness: sedated  Complications: No apparent anesthesia complications

## 2013-12-18 NOTE — Preoperative (Signed)
Beta Blockers   Reason not to administer Beta Blockers:Not Applicable 

## 2013-12-18 NOTE — Anesthesia Preprocedure Evaluation (Signed)
Anesthesia Evaluation  Patient identified by MRN, date of birth, ID band Patient awake    Reviewed: Allergy & Precautions, H&P , Patient's Chart, lab work & pertinent test results, reviewed documented beta blocker date and time   History of Anesthesia Complications Negative for: history of anesthetic complications  Airway Mallampati: II TM Distance: >3 FB Neck ROM: full    Dental   Pulmonary  breath sounds clear to auscultation        Cardiovascular Exercise Tolerance: Good Rhythm:regular Rate:Normal     Neuro/Psych    GI/Hepatic   Endo/Other  Hypothyroidism   Renal/GU Renal disease     Musculoskeletal   Abdominal   Peds  Hematology  (+) anemia ,   Anesthesia Other Findings Adductor canal block  Reproductive/Obstetrics                           Anesthesia Physical Anesthesia Plan  ASA: II  Anesthesia Plan: General ETT and Regional   Post-op Pain Management:    Induction:   Airway Management Planned:   Additional Equipment:   Intra-op Plan:   Post-operative Plan:   Informed Consent: I have reviewed the patients History and Physical, chart, labs and discussed the procedure including the risks, benefits and alternatives for the proposed anesthesia with the patient or authorized representative who has indicated his/her understanding and acceptance.   Dental Advisory Given  Plan Discussed with: CRNA and Surgeon  Anesthesia Plan Comments:         Anesthesia Quick Evaluation

## 2013-12-18 NOTE — Progress Notes (Signed)
Patient bladder scanned after 6 hrs with no void. Bladder scan showing 1000cc but patient refuses I/O cath. She states that "she is not uncomfortable and will be able to void when she relaxes more." Nursing will continue to monitor.

## 2013-12-18 NOTE — Anesthesia Procedure Notes (Addendum)
Anesthesia Regional Block:  Adductor canal block  Pre-Anesthetic Checklist: ,, timeout performed, Correct Patient, Correct Site, Correct Laterality, Correct Procedure, Correct Position, site marked, Risks and benefits discussed,  Surgical consent,  Pre-op evaluation,  At surgeon's request and post-op pain management  Laterality: Right  Prep: chloraprep       Needles:  Injection technique: Single-shot  Needle Type: Stimiplex          Additional Needles:  Procedures: ultrasound guided (picture in chart) Adductor canal block Narrative:  Start time: 12/18/2013 7:10 AM End time: 12/18/2013 7:29 AM Injection made incrementally with aspirations every 5 mL.  Performed by: Personally   Additional Notes: Risks, benefits and alternative to block explained extensively.  Patient tolerated procedure well, without complications.   Procedure Name: Intubation Date/Time: 12/18/2013 7:42 AM Performed by: Margaree Mackintosh Pre-anesthesia Checklist: Patient identified, Timeout performed, Emergency Drugs available, Suction available and Patient being monitored Patient Re-evaluated:Patient Re-evaluated prior to inductionOxygen Delivery Method: Circle system utilized Preoxygenation: Pre-oxygenation with 100% oxygen Intubation Type: IV induction Ventilation: Mask ventilation without difficulty and Oral airway inserted - appropriate to patient size Laryngoscope Size: Mac and 3 Grade View: Grade II Tube type: Oral Tube size: 7.5 mm Number of attempts: 1 Airway Equipment and Method: Stylet Placement Confirmation: ETT inserted through vocal cords under direct vision,  positive ETCO2 and breath sounds checked- equal and bilateral Secured at: 21 cm Tube secured with: Tape Dental Injury: Teeth and Oropharynx as per pre-operative assessment

## 2013-12-18 NOTE — Progress Notes (Signed)
Orthopedic Tech Progress Note Patient Details:  Christy [name] 1956-09-09 [id number]  CPM Right Knee CPM Right Knee: On Right Knee Flexion (Degrees): 60 Right Knee Extension (Degrees): 0 Additional Comments: Trapeze bar   Cammer, Mickie Bail 12/18/2013, 10:01 AM

## 2013-12-18 NOTE — Transfer of Care (Signed)
Immediate Anesthesia Transfer of Care Note  Patient: Christy [name]  Procedure(s) Performed: Procedure(s): TOTAL KNEE ARTHROPLASTY (Right)  Patient Location: PACU  Anesthesia Type:General  Level of Consciousness: awake, alert  and oriented  Airway & Oxygen Therapy: Patient Spontanous Breathing and Patient connected to nasal cannula oxygen  Post-op Assessment: Report given to PACU RN, Post -op Vital signs reviewed and stable and Patient moving all extremities X 4  Post vital signs: Reviewed and stable  Complications: No apparent anesthesia complications

## 2013-12-18 NOTE — Interval H&P Note (Signed)
History and Physical Interval Note:  12/18/2013 7:23 AM  Christy [name]  has presented today for surgery, with the diagnosis of RIGHT KNEE OSTEOARTHRITIS  The various methods of treatment have been discussed with the patient and family. After consideration of risks, benefits and other options for treatment, the patient has consented to  Procedure(s): TOTAL KNEE ARTHROPLASTY (Right) as a surgical intervention .  The patient's history has been reviewed, patient examined, no change in status, stable for surgery.  I have reviewed the patient's chart and labs.  Questions were answered to the patient's satisfaction.     Nestor Lewandowsky

## 2013-12-18 NOTE — Care Management Note (Signed)
CARE MANAGEMENT NOTE 12/18/2013  Patient:  Christy [name], Christy [name]   Account Number:  [id number]  Date Initiated:  12/18/2013  Documentation initiated by:  Vance Peper  Subjective/Objective Assessment:   57 yr old female s/p right total knee arthroplasty.     Action/Plan:   CM spoke with patient briefly about HH. Choice offered. CM will F/U   Anticipated DC Date:  12/20/2013   Anticipated DC Plan:  HOME W HOME HEALTH SERVICES      DC Planning Services  CM consult      Choice offered to / List presented to:          [facility] arranged  HH-2 PT      Status of service:  In process, will continue to follow  Discharge Disposition:

## 2013-12-18 NOTE — Progress Notes (Signed)
Utilization review completed.  

## 2013-12-18 NOTE — Op Note (Signed)
PATIENT ID:      Christy [name]  MRN:     [id number] DOB/AGE:    07-20-1956 / 57 y.o.       OPERATIVE REPORT    DATE OF PROCEDURE:  12/18/2013       PREOPERATIVE DIAGNOSIS:   RIGHT KNEE OSTEOARTHRITIS      Estimated body mass index is 29.47 kg/(m^2) as calculated from the following:   Height as of 12/13/13: 5' 6.25" (1.683 m).   Weight as of 10/09/13: 83.462 kg (184 lb).                                                        POSTOPERATIVE DIAGNOSIS:   RIGHT KNEE OSTEOARTHRITIS                                                                      PROCEDURE:  Procedure(s): TOTAL KNEE ARTHROPLASTY Using Depuy Sigma RP implants #3R Femur, #3Tibia, 10mm sigma RP bearing, 35 Patella     SURGEON: Chukwuemeka Artola J    ASSISTANT:   Eric K. [employer]   (Present and scrubbed throughout the case, critical for assistance with exposure, retraction, instrumentation, and closure.)         ANESTHESIA: GET Exparel FNB  DRAINS: foley, 2 medium hemovac in knee   TOURNIQUET TIME:   COMPLICATIONS:  None     SPECIMENS: None   INDICATIONS FOR PROCEDURE: The patient has  RIGHT KNEE OSTEOARTHRITIS, varus deformities, XR shows bone on bone arthritis. Patient has failed all conservative measures including anti-inflammatory medicines, narcotics, attempts at  exercise and weight loss, cortisone injections and viscosupplementation.  Risks and benefits of surgery have been discussed, questions answered.   DESCRIPTION OF PROCEDURE: The patient identified by armband, received  IV antibiotics, in the holding area at [facility]. Patient taken to the operating room, appropriate anesthetic  monitors were attached, and general endotracheal anesthesia induced with  the patient in supine position, Foley catheter was inserted. Tourniquet  applied high to the operative thigh. Lateral post and foot positioner  applied to the table, the lower extremity was then prepped and draped  in usual sterile fashion from  the ankle to the tourniquet. Time-out procedure was performed. The limb was wrapped with an Esmarch bandage and the tourniquet inflated to 350 mmHg. We began the operation by making the anterior midline incision starting at handbreadth above the patella going over the patella 1 cm medial to and  4 cm distal to the tibial tubercle. Small bleeders in the skin and the  subcutaneous tissue identified and cauterized. Transverse retinaculum was incised and reflected medially and a medial parapatellar arthrotomy was accomplished. the patella was everted and theprepatellar fat pad resected. The superficial medial collateral  ligament was then elevated from anterior to posterior along the proximal  flare of the tibia and anterior half of the menisci resected. The knee was hyperflexed exposing bone on bone arthritis. Peripheral and notch osteophytes as well as the cruciate ligaments were then resected. We continued to  work our way around posteriorly along  the proximal tibia, and externally  rotated the tibia subluxing it out from underneath the femur. A McHale  retractor was placed through the notch and a lateral Hohmann retractor  placed, and we then drilled through the proximal tibia in line with the  axis of the tibia followed by an intramedullary guide rod and 2-degree  posterior slope cutting guide. The tibial cutting guide was pinned into place  allowing resection of 8 mm of bone medially and about 3 mm of bone  laterally because of her varus deformity. Satisfied with the tibial resection, we then  entered the distal femur 2 mm anterior to the PCL origin with the  intramedullary guide rod and applied the distal femoral cutting guide  set at 11mm, with 5 degrees of valgus. This was pinned along the  epicondylar axis. At this point, the distal femoral cut was accomplished without difficulty. We then sized for a #3R femoral component and pinned the guide in 0 degrees of external rotation.The chamfer  cutting guide was pinned into place. The anterior, posterior, and chamfer cuts were accomplished without difficulty followed by  the Sigma RP box cutting guide and the box cut. We also removed posterior osteophytes from the posterior femoral condyles. At this  time, the knee was brought into full extension. We checked our  extension and flexion gaps and found them symmetric at 10mm.  The patella thickness measured at 25 mm. We set the cutting guide at 15 and removed the posterior 10 mm  of the patella, sized for a 35 button and drilled the lollipop. The knee  was then once again hyperflexed exposing the proximal tibia. We sized for a #3 tibial base plate, applied the smokestack and the conical reamer followed by the the Delta fin keel punch. We then hammered into place the Sigma RP trial femoral component, inserted a 10-mm trial bearing, trial patellar button, and took the knee through range of motion from 0-130 degrees. No thumb pressure was required for patellar  tracking. At this point, all trial components were removed, a double batch of DePuy HV cement with 1500 mg of Zinacef was mixed and applied to all bony metallic mating surfaces except for the posterior condyles of the femur itself. In order, we  hammered into place the tibial tray and removed excess cement, the femoral component and removed excess cement, a 10-mm Sigma RP bearing  was inserted, and the knee brought to full extension with compression.  The patellar button was clamped into place, and excess cement  removed. While the cement cured the wound was irrigated out with normal saline solution pulse lavage, and medium Hemovac drains were placed from an anterolateral  approach. Ligament stability and patellar tracking were checked and found to be excellent. The parapatellar arthrotomy was closed with  running #1 Vicryl suture. The subcutaneous tissue with 0 and 2-0 undyed  Vicryl suture, and the skin with skin staples. A dressing of  Xeroform,  4 x 4, dressing sponges, Webril, and Ace wrap applied. The patient  awakened, extubated, and taken to recovery room without difficulty.   Christy Plancarte J 12/18/2013, 9:08 AM

## 2013-12-18 NOTE — Evaluation (Addendum)
Physical Therapy Evaluation Patient Details Name: Christy [name] MRN: [id number] DOB: 07-Feb-1956 Today's Date: 12/18/2013 Time: 4098-1191 PT Time Calculation (min): 26 min and 7 minutes at 16:00 = total of 33 min  PT Assessment / Plan / Recommendation History of Present Illness  Patient is a 57 yo female s/p Rt TKA.  Clinical Impression  Patient presents with problems listed below.  Will benefit from acute PT to maximize independence prior to discharge home with help from son.  Patient will need to focus on safety awareness along with mobility.    PT Assessment  Patient needs continued PT services    Follow Up Recommendations  Home health PT;Supervision/Assistance - 24 hour    Does the patient have the potential to tolerate intense rehabilitation      Barriers to Discharge Decreased caregiver support Patient lives alone.  Reports son is staying with her.    Equipment Recommendations  Rolling walker with 5" wheels;Hospital bed    Recommendations for Other Services     Frequency 7X/week    Precautions / Restrictions Precautions Precautions: Knee Precaution Booklet Issued: Yes (comment) Precaution Comments: Provided education to patient. Restrictions Weight Bearing Restrictions: Yes RLE Weight Bearing: Weight bearing as tolerated   Pertinent Vitals/Pain Pain 10/10 impacting mobility      Mobility  Bed Mobility Bed Mobility: Supine to Sit;Sitting - Scoot to Edge of Bed Supine to Sit: 4: Min assist;With rails Sitting - Scoot to Edge of Bed: 4: Min guard;With rail Details for Bed Mobility Assistance: Verbal cues for technique.  Assist to move RLE off of bed.  Good balance in sitting position. Transfers Transfers: Sit to Stand;Stand to Sit Sit to Stand: 4: Min assist;With upper extremity assist;From bed;From toilet Stand to Sit: 4: Min guard;With upper extremity assist;To toilet;With armrests;To chair/3-in-1 Details for Transfer Assistance: Verbal cues for hand  placement and placement of RLE during transfers.  Patient did not call for assist to stand from toilet - encouraged patient to ask for help (PT was at bathroom door entire time).  Encouraged patient to think of safety first. Ambulation/Gait Ambulation/Gait Assistance: 4: Min assist Ambulation Distance (Feet): 24 Feet Assistive device: Rolling walker Ambulation/Gait Assistance Details: Verbal cues for safe use of RW and gait sequence.  Cues to stay close to RW for safety. Gait Pattern: Step-to pattern;Decreased stance time - right;Decreased step length - left;Antalgic Gait velocity: Slow gait speed    Exercises Total Joint Exercises Ankle Circles/Pumps: AROM;Both;10 reps;Seated   PT Diagnosis: Difficulty walking;Acute pain;Altered mental status  PT Problem List: Decreased strength;Decreased range of motion;Decreased activity tolerance;Decreased balance;Decreased mobility;Decreased cognition;Decreased knowledge of use of DME;Decreased safety awareness;Decreased knowledge of precautions;Pain PT Treatment Interventions: DME instruction;Gait training;Stair training;Functional mobility training;Therapeutic exercise;Patient/family education;Cognitive remediation     PT Goals(Current goals can be found in the care plan section) Acute Rehab PT Goals Patient Stated Goal: To decrease pain PT Goal Formulation: With patient Time For Goal Achievement: 12/25/13 Potential to Achieve Goals: Good  Visit Information  Last PT Received On: 12/18/13 Assistance Needed: +1 History of Present Illness: Patient is a 57 yo female s/p Rt TKA.       Prior Functioning  Home Living Family/patient expects to be discharged to:: Private residence Living Arrangements: Alone Available Help at Discharge: Family;Available 24 hours/day (36 yo son) Type of Home: House Home Access: Stairs to enter [employer] of Steps: 2 Entrance Stairs-Rails: None Home Layout: Two level;Able to live on main level with  bedroom/bathroom [facility] bed on first floor) Home Equipment:  Bedside commode Prior Function Level of Independence: Independent Communication Communication: No difficulties    Cognition  Cognition Arousal/Alertness: Awake/alert Behavior During Therapy: Agitated;Impulsive ("I'm very irritable right now"  Pulling off O2 sat monitor) Overall Cognitive Status: Impaired/Different from baseline Area of Impairment: Safety/judgement Safety/Judgement: Decreased awareness of safety General Comments: Instructed patient to call when ready to get off of toilet.  Patient up on her own in bathroom without calling for assist (PT at door entire time).  Explained to patient about her nerve block and potential to lose balance and fall.  Patient irritated, pulling off O2 sat monitor stating it is agrivating.  Encouraged patient to ask for assist and think of safety first.    Extremity/Trunk Assessment Upper Extremity Assessment Upper Extremity Assessment: Overall WFL for tasks assessed Lower Extremity Assessment Lower Extremity Assessment: RLE deficits/detail RLE Deficits / Details: Decreased ROM and strength due to surgery/pain.  Able to assist with moving RLE off of bed - at least 3/5. RLE: Unable to fully assess due to pain Cervical / Trunk Assessment Cervical / Trunk Assessment: Normal   Balance    End of Session PT - End of Session Equipment Utilized During Treatment: Gait belt Activity Tolerance: Patient limited by pain;Patient limited by fatigue Patient left: in chair;with call bell/phone within reach;with family/visitor present Nurse Communication: Mobility status CPM Right Knee CPM Right Knee: Off  GP     Vena Austria 12/18/2013, 4:06 PM Durenda Hurt. Renaldo Fiddler, [facility] Acute Rehab Services Pager [phone]

## 2013-12-19 ENCOUNTER — Encounter (HOSPITAL_COMMUNITY): Payer: Self-pay | Admitting: Orthopedic Surgery

## 2013-12-19 LAB — CBC
MCH: 30.3 pg (ref 26.0–34.0)
Platelets: 164 10*3/uL (ref 150–400)
RBC: 3.9 MIL/uL (ref 3.87–5.11)
RDW: 12.9 % (ref 11.5–15.5)
WBC: 7.4 10*3/uL (ref 4.0–10.5)

## 2013-12-19 NOTE — Progress Notes (Signed)
Patient ID: Christy [name], female   DOB: 09/18/1956, 57 y.o.   MRN: [id number] PATIENT ID: Christy [name]  MRN: [id number]  DOB/AGE:  1956/03/23 / 57 y.o.  1 Day Post-Op Procedure(s) (LRB): TOTAL KNEE ARTHROPLASTY (Right)    PROGRESS NOTE Subjective: Patient is alert, oriented, no Nausea, no Vomiting, yes passing gas, no Bowel Movement. Taking PO well. Denies SOB, Chest or Calf Pain. Using Incentive Spirometer, PAS in place. Ambulate WBAT in room yesterday, CPM 0-60 Patient reports pain as 5 on 0-10 scale  .    Objective: Vital signs in last 24 hours: Filed Vitals:   12/18/13 2215 12/19/13 0000 12/19/13 0400 12/19/13 0715  BP: 138/78   136/75  Pulse: 70   75  Temp: 98.2 F (36.8 C)   98.7 F (37.1 C)  TempSrc:      Resp: 18 18 18 18   SpO2: 99% 99% 99% 99%      Intake/Output from previous day: I/O last 3 completed shifts: In: 2249.2 [P.O.:720; I.V.:1529.2] Out: 590 [Drains:550; Blood:40]   Intake/Output this shift:     LABORATORY DATA:  Recent Labs  12/19/13 0647  WBC 7.4  HGB 11.8*  HCT 35.1*  PLT 164    Examination: Neurologically intact ABD soft Neurovascular intact Sensation intact distally Intact pulses distally Dorsiflexion/Plantar flexion intact Incision: no drainage No cellulitis present Compartment soft}  Assessment:   1 Day Post-Op Procedure(s) (LRB): TOTAL KNEE ARTHROPLASTY (Right) ADDITIONAL DIAGNOSIS:  ADD  Plan: PT/OT WBAT, CPM 5/hrs day until ROM 0-90 degrees, then D/C CPM DVT Prophylaxis:  SCDx72hrs, ASA 325 mg BID x 2 weeks DISCHARGE PLAN: Home DISCHARGE NEEDS: HHPT, HHRN, CPM, Walker and 3-in-1 comode seat     Christy [name] 12/19/2013, 7:25 AM

## 2013-12-19 NOTE — Care Management Note (Signed)
CARE MANAGEMENT NOTE 12/19/2013  Patient:  Christy [name], Christy [name]   Account Number:  [id number]  Date Initiated:  12/18/2013  Documentation initiated by:  Vance Peper  Subjective/Objective Assessment:   57 yr old female s/p right total knee arthroplasty.     Action/Plan:   CM spoke with patient briefly about HH. Choice offered. CM will F/U  Patient will go [facility] with [facility]. DME to be delivered. Has family support at discharge.   Anticipated DC Date:  12/20/2013   Anticipated DC Plan:  HOME W HOME HEALTH SERVICES      DC Planning Services  CM consult      [facility]  HOME HEALTH  DURABLE MEDICAL EQUIPMENT   Choice offered to / List presented to:  C-1 Patient   DME arranged  3-N-1  CPM      DME agency  [employer]     HH arranged  HH-2 PT      HH agency  [facility]   Status of service:  Completed, signed off Medicare Important Message given?   (If response is "NO", the following Medicare IM given date fields will be blank) Date Medicare IM given:   Date Additional Medicare IM given:    Discharge Disposition:  HOME W HOME HEALTH SERVICES  Per UR Regulation:

## 2013-12-19 NOTE — Progress Notes (Signed)
OT Cancellation Note and Discharge  Patient Details Name: Christy [name] MRN: [id number] DOB: Jun 03, 1956   Cancelled Treatment:    Reason Eval/Treat Not Completed: OT screened, no needs identified, will sign off. Pt has needed A prn at home and does not feel that she has an issues with BADLs--pt does need a 3n1 and I will make CM aware.  Evette Georges [phone] 12/19/2013, 11:38 AM

## 2013-12-19 NOTE — Progress Notes (Signed)
Physical Therapy Treatment Patient Details Name: Christy [name] MRN: [id number] DOB: 1956/12/25 Today's Date: 12/19/2013 Time: 4782-9562 PT Time Calculation (min): 45 min  PT Assessment / Plan / Recommendation  History of Present Illness Patient is a 57 yo female s/p Rt TKA.   PT Comments   Pt making great progress with mobility.  Anticipate pt to be safe to d/c home when MD feels medically ready.  Cont with current POC to maximize functional recovery & independence with mobility prior to returning home.     Follow Up Recommendations  Home health PT;Supervision/Assistance - 24 hour     Does the patient have the potential to tolerate intense rehabilitation     Barriers to Discharge        Equipment Recommendations  Rolling walker with 5" wheels;Hospital bed    Recommendations for Other Services    Frequency 7X/week   Progress towards PT Goals Progress towards PT goals: Progressing toward goals  Plan Current plan remains appropriate    Precautions / Restrictions Precautions Precautions: Knee Precaution Comments: Provided education to patient.  FOB locked in flat position Restrictions RLE Weight Bearing: Weight bearing as tolerated   Pertinent Vitals/Pain 6/10 Rt knee.  Repositioned for comfort.      Mobility  Bed Mobility Bed Mobility: Supine to Sit;Sitting - Scoot to Edge of Bed Supine to Sit: 4: Min guard;HOB flat Sitting - Scoot to [employer] of Bed: 4: Min guard Details for Bed Mobility Assistance: Incr time but no physical (A) needed Transfers Transfers: Sit to Stand;Stand to Sit Sit to Stand: 4: Min guard;With upper extremity assist;From bed Stand to Sit: 4: Min guard;With upper extremity assist;With armrests;To chair/3-in-1 Details for Transfer Assistance: cues for hand placement & RLE positioning before sitting  Ambulation/Gait Ambulation/Gait Assistance: 4: Min guard Ambulation Distance (Feet): 120 Feet Assistive device: Rolling walker Ambulation/Gait Assistance  Details: cues for sequencing, increased heel strike RLE, terminal knee extension.  Pt required multiple standing rest breaks due to UE fatigue.   Gait Pattern: Step-to pattern;Decreased weight shift to right;Antalgic Stairs: No Wheelchair Mobility Wheelchair Mobility: No    Exercises Total Joint Exercises Ankle Circles/Pumps: AROM;Both;10 reps Quad Sets: AROM;Strengthening;Both;10 reps Heel Slides: AAROM;Strengthening;Right;10 reps Hip ABduction/ADduction: AAROM;Strengthening;Right;10 reps Straight Leg Raises: AAROM;Strengthening;Right;10 reps Long Arc Quad: AAROM;Strengthening;Right;10 reps Knee Flexion: AAROM;Strengthening;Right;5 reps;Seated     PT Goals (current goals can now be found in the care plan section) Acute Rehab PT Goals Patient Stated Goal: To decrease pain PT Goal Formulation: With patient Time For Goal Achievement: 12/25/13 Potential to Achieve Goals: Good  Visit Information  Last PT Received On: 12/19/13 Assistance Needed: +1 History of Present Illness: Patient is a 57 yo female s/p Rt TKA.    Subjective Data  Patient Stated Goal: To decrease pain   Cognition  Cognition Arousal/Alertness: Awake/alert Behavior During Therapy: WFL for tasks assessed/performed Overall Cognitive Status: Within Functional Limits for tasks assessed    Balance     End of Session PT - End of Session Equipment Utilized During Treatment: Gait belt Activity Tolerance: Patient tolerated treatment well Patient left: in chair;with call bell/phone within reach Nurse Communication: Mobility status   GP     Lara Mulch 12/19/2013, 8:42 AM  Verdell Face, PTA [phone] 12/19/2013

## 2013-12-19 NOTE — Progress Notes (Signed)
Physical Therapy Treatment Patient Details Name: Christy [name] MRN: [id number] DOB: 07-07-1956 Today's Date: 12/19/2013 Time: 4098-1191 PT Time Calculation (min): 17 min  PT Assessment / Plan / Recommendation  History of Present Illness Patient is a 57 yo female s/p Rt TKA.   PT Comments   Patient progressing well with mobility. Able to complete stair training this afternoon. Educated on safety and taking time for task  Follow Up Recommendations  Home health PT;Supervision/Assistance - 24 hour     Does the patient have the potential to tolerate intense rehabilitation     Barriers to Discharge        Equipment Recommendations  Rolling walker with 5" wheels;Hospital bed    Recommendations for Other Services    Frequency 7X/week   Progress towards PT Goals Progress towards PT goals: Progressing toward goals  Plan Current plan remains appropriate    Precautions / Restrictions Precautions Precautions: Knee Restrictions RLE Weight Bearing: Weight bearing as tolerated   Pertinent Vitals/Pain no apparent distress    Mobility  Bed Mobility Supine to Sit: 5: Supervision Transfers Sit to Stand: 5: Supervision Stand to Sit: 5: Supervision Details for Transfer Assistance: cues for hand placement  Ambulation/Gait Ambulation/Gait Assistance: 4: Min guard Ambulation Distance (Feet): 200 Feet Assistive device: Rolling walker Ambulation/Gait Assistance Details: Patient slightly impulsive. Cues for safety. Working step through however increased L knee flexion Gait Pattern: Step-through pattern;Decreased stride length Stairs: Yes Stairs Assistance: 4: Min guard Stair Management Technique: Step to pattern;Forwards;Two rails Number of Stairs: 5    Exercises     PT Diagnosis:    PT Problem List:   PT Treatment Interventions:     PT Goals (current goals can now be found in the care plan section)    Visit Information  Last PT Received On: 12/19/13 Assistance Needed:  +1 History of Present Illness: Patient is a 56 yo female s/p Rt TKA.    Subjective Data      Cognition  Cognition Arousal/Alertness: Awake/alert Behavior During Therapy: WFL for tasks assessed/performed Overall Cognitive Status: Within Functional Limits for tasks assessed    Balance     End of Session PT - End of Session Equipment Utilized During Treatment: Gait belt Activity Tolerance: Patient tolerated treatment well Patient left: in chair;with call bell/phone within reach Nurse Communication: Mobility status CPM Right Knee CPM Right Knee: Off   GP     Fredrich Birks 12/19/2013, 2:29 PM 12/19/2013 Fredrich Birks PTA [phone] pager [phone] office

## 2013-12-20 LAB — CBC
HCT: 34.3 % — ABNORMAL LOW (ref 36.0–46.0)
Hemoglobin: 11.4 g/dL — ABNORMAL LOW (ref 12.0–15.0)
RDW: 13 % (ref 11.5–15.5)
WBC: 6.2 10*3/uL (ref 4.0–10.5)

## 2013-12-20 MED ORDER — CIPROFLOXACIN HCL 750 MG PO TABS: 750.0000 mg | ORAL_TABLET | Freq: Two times a day (BID) | ORAL | Status: DC

## 2013-12-20 MED ORDER — CIPROFLOXACIN HCL 750 MG PO TABS
750.0000 mg | ORAL_TABLET | Freq: Two times a day (BID) | ORAL | Status: DC
  Filled 2013-12-20 (×3): qty 1

## 2013-12-20 NOTE — Progress Notes (Signed)
Patient ID: Christy [name], female   DOB: 07-26-1956, 57 y.o.   MRN: [id number] PATIENT ID: Christy [name]  MRN: [id number]  DOB/AGE:  11/22/1956 / 57 y.o.  2 Days Post-Op Procedure(s) (LRB): TOTAL KNEE ARTHROPLASTY (Right)    PROGRESS NOTE Subjective: Patient is alert, oriented, no Nausea, no Vomiting, yes passing gas, no Bowel Movement. Taking PO well. Denies SOB, Chest or Calf Pain. Using Incentive Spirometer, PAS in place. Did well with PT Concerned she may be developing a UTI, which she does on occasion, usually treated with Cipro.    Objective: Vital signs in last 24 hours: Filed Vitals:   12/20/13 0000 12/20/13 0400 12/20/13 0456 12/20/13 0540  BP:    137/71  Pulse:    105  Temp:   99.9 F (37.7 C) 99.5 F (37.5 C)  TempSrc:   Oral Oral  Resp: 18 18  18   SpO2: 98% 98%  99%      Intake/Output from previous day: I/O last 3 completed shifts: In: 2500 [P.O.:2160; I.V.:340] Out: 250 [Drains:250]   Intake/Output this shift:     LABORATORY DATA:  Recent Labs  12/19/13 0647 12/20/13 0451  WBC 7.4 6.2  HGB 11.8* 11.4*  HCT 35.1* 34.3*  PLT 164 136*    Examination: Neurologically intact ABD soft Neurovascular intact Sensation intact distally Intact pulses distally Dorsiflexion/Plantar flexion intact Incision: no drainage No cellulitis present Compartment soft}  Assessment:   2 Days Post-Op Procedure(s) (LRB): TOTAL KNEE ARTHROPLASTY (Right) ADDITIONAL DIAGNOSIS:  Possible UTI  Plan: PT/OT WBAT, CPM 5/hrs day until ROM 0-90 degrees, then D/C CPM DVT Prophylaxis:  SCDx72hrs, ASA 325 mg BID x 2 weeks DISCHARGE PLAN: Home today--will add Cipro DISCHARGE NEEDS: HHPT, HHRN, CPM, Walker and 3-in-1 comode seat     Josiah Wojtaszek A. 12/20/2013, 8:26 AM

## 2013-12-20 NOTE — Progress Notes (Signed)
Physical Therapy Treatment Patient Details Name: Christy [name] MRN: [id number] DOB: May 06, 1956 Today's Date: 12/20/2013 Time: 2440-1027 PT Time Calculation (min): 25 min  PT Assessment / Plan / Recommendation  History of Present Illness Patient is a 57 yo female s/p Rt TKA.   PT Comments   Pt cont's to move well but states Rt knee feels "stiff" today.  Pt has difficulty achieving terminal knee extension RLE.     Follow Up Recommendations  Home health PT;Supervision/Assistance - 24 hour     Does the patient have the potential to tolerate intense rehabilitation     Barriers to Discharge        Equipment Recommendations  Rolling walker with 5" wheels;Hospital bed    Recommendations for Other Services    Frequency 7X/week   Progress towards PT Goals Progress towards PT goals: Progressing toward goals  Plan Current plan remains appropriate    Precautions / Restrictions Precautions Precautions: Knee Restrictions Weight Bearing Restrictions: Yes RLE Weight Bearing: Weight bearing as tolerated       Mobility  Bed Mobility Bed Mobility: Not assessed Transfers Transfers: Sit to Stand;Stand to Sit Sit to Stand: 6: Modified independent (Device/Increase time);With upper extremity assist;From bed Stand to Sit: 6: Modified independent (Device/Increase time);With upper extremity assist;With armrests;To chair/3-in-1 Ambulation/Gait Ambulation/Gait Assistance: 5: Supervision Ambulation Distance (Feet): 200 Feet Assistive device: Rolling walker Ambulation/Gait Assistance Details: Pt cont's to have difficulty achieving terminal knee ext during stance phase Gait Pattern: Step-through pattern;Decreased stride length;Right flexed knee in stance Stairs: Yes Stairs Assistance: 5: Supervision Stair Management Technique: Two rails;Step to pattern;Forwards Number of Stairs: 5 Wheelchair Mobility Wheelchair Mobility: No    Exercises Total Joint Exercises Quad Sets: AAROM;Right;10 reps  (manual facilitation provided to increase knee extension- stretch held 15 secs each rep) Knee Flexion: AAROM;Right;10 reps (Self AAROM x 5)     PT Goals (current goals can now be found in the care plan section) Acute Rehab PT Goals PT Goal Formulation: With patient Time For Goal Achievement: 12/25/13 Potential to Achieve Goals: Good  Visit Information  Last PT Received On: 12/20/13 Assistance Needed: +1 History of Present Illness: Patient is a 57 yo female s/p Rt TKA.    Subjective Data      Cognition  Cognition Arousal/Alertness: Awake/alert Behavior During Therapy: WFL for tasks assessed/performed Overall Cognitive Status: Within Functional Limits for tasks assessed    Balance     End of Session PT - End of Session Equipment Utilized During Treatment: Gait belt Activity Tolerance: Patient tolerated treatment well Patient left: in chair;with call bell/phone within reach Nurse Communication: Mobility status CPM Right Knee CPM Right Knee: Off   GP     Lara Mulch 12/20/2013, 9:01 AM  Verdell Face, PTA [phone] 12/20/2013

## 2014-01-01 ENCOUNTER — Telehealth: Payer: Self-pay | Admitting: *Deleted

## 2014-01-01 NOTE — Telephone Encounter (Signed)
sw pt informed her that her labs for 04/11/14 will be moved to 04/12/14. gv appt for 04/12/14 w/labs@ 8:30am and ov@ 9am. Pt is aware...td

## 2014-01-06 NOTE — Discharge Summary (Signed)
Patient ID: Christy [name] MRN: [id number] DOB/AGE: 03/10/1956 58 y.o.  Admit date: 12/18/2013 Discharge date: 12/20/13   Admission Diagnoses:  Active Problems:   Arthritis of right knee   Discharge Diagnoses:  Same  Past Medical History  Diagnosis Date  . Breast cancer   . Allergy     rhinitis  . Anemia   . ADD (attention deficit disorder)   . Obesity   . Renal calculi   . Hypothyroid   . Internal hemorrhoids   . Tubular adenoma of colon     Surgeries: Procedure(s): TOTAL KNEE ARTHROPLASTY on 12/18/2013   Consultants:    Discharged Condition: Improved  Hospital Course: KARAN INCLAN is an 58 y.o. female who was admitted 12/18/2013 for operative treatment of<principal problem not specified>. Patient has severe unremitting pain that affects sleep, daily activities, and work/hobbies. After pre-op clearance the patient was taken to the operating room on 12/18/2013 and underwent  Procedure(s): TOTAL KNEE ARTHROPLASTY.    Patient was given perioperative antibiotics:  Anti-infectives   Start     Dose/Rate Route Frequency Ordered Stop   12/20/13 0845  ciprofloxacin (CIPRO) tablet 750 mg  Status:  Discontinued     750 mg Oral 2 times daily 12/20/13 0828 12/20/13 1247   12/20/13 0000  ciprofloxacin (CIPRO) 750 MG tablet  Status:  Discontinued     750 mg Oral 2 times daily 12/20/13 0829 12/20/13    12/20/13 0000  ciprofloxacin (CIPRO) 750 MG tablet     750 mg Oral 2 times daily 12/20/13 0831     12/18/13 0806  cefUROXime (ZINACEF) injection  Status:  Discontinued       As needed 12/18/13 0806 12/18/13 0939   12/18/13 0600  ceFAZolin (ANCEF) IVPB 2 g/50 mL premix     2 g 100 mL/hr over 30 Minutes Intravenous On call to O.R. 12/17/13 1447 12/18/13 0746       Patient was given sequential compression devices, early ambulation, and chemoprophylaxis to prevent DVT.  Patient benefited maximally from hospital stay and there were no complications.    Recent vital signs: No  data found.    Recent laboratory studies: No results found for this basename: WBC, HGB, HCT, PLT, NA, K, CL, CO2, BUN, CREATININE, GLUCOSE, PT, INR, CALCIUM, 2,  in the last 72 hours   Discharge Medications:     Medication List    STOP taking these medications       cyclobenzaprine 10 MG tablet  Commonly known as:  FLEXERIL      TAKE these medications       aspirin EC 325 MG tablet  Take 1 tablet (325 mg total) by mouth 2 (two) times daily.     celecoxib 200 MG capsule  Commonly known as:  CELEBREX  Take 200 mg by mouth daily.     ciprofloxacin 750 MG tablet  Commonly known as:  CIPRO  Take 1 tablet (750 mg total) by mouth 2 (two) times daily.     HYDROCORTISONE ACE (RECTAL) 30 MG Supp  Place 1 suppository (30 mg total) rectally 2 (two) times daily as needed.     letrozole 2.5 MG tablet  Commonly known as:  FEMARA  Take 2.5 mg by mouth daily.     levothyroxine 50 MCG tablet  Commonly known as:  SYNTHROID, LEVOTHROID  Take 50 mcg by mouth daily before breakfast.     lisdexamfetamine 60 MG capsule  Commonly known as:  VYVANSE  Take 1 capsule (60 mg  total) by mouth every morning.     methocarbamol 500 MG tablet  Commonly known as:  ROBAXIN  Take 1 tablet (500 mg total) by mouth 2 (two) times daily with a meal.     multivitamin capsule  Take 3 capsules by mouth daily.     oxyCODONE-acetaminophen 5-325 MG per tablet  Commonly known as:  ROXICET  Take 1 tablet by mouth every 4 (four) hours as needed.     ranitidine 300 MG tablet  Commonly known as:  ZANTAC  Take 1 tablet (300 mg total) by mouth at bedtime.     venlafaxine XR 150 MG 24 hr capsule  Commonly known as:  EFFEXOR-XR  Take 150 mg by mouth daily with breakfast.     Vitamin D 2000 UNITS tablet  Take 2,000 Units by mouth daily.        Diagnostic Studies: Dg Chest 2 View  12/13/2013   CLINICAL DATA:  Preoperative examination  EXAM: CHEST  2 VIEW  COMPARISON:  03/14/2009; CT abdomen pelvis  -01/27/2010  FINDINGS: Grossly unchanged cardiac silhouette and mediastinal contours with mild tortuosity of the thoracic aorta. The lungs appear hyperexpanded with flattening of the bilateral mid diaphragms mild diffuse slightly nodular thickening of the pulmonary interstitium. No focal airspace opacities. No pleural effusion or pneumothorax. No evidence of edema. Unchanged bones. Post bilateral mastectomy and breast augmentation. Multiple surgical clips overlie the upper abdomen.  IMPRESSION: Hyperexpanded lungs and bronchitic change without acute cardiopulmonary disease.   Electronically Signed   By: Sandi Mariscal M.D.   On: 12/13/2013 11:20    Disposition: 06-Home-Health Care Svc       Future Appointments Provider Department Dept Phone   01/08/2014 8:45 AM Alice Reichert, PT [facility] [phone]   04/12/2014 8:30 AM [facility] [phone]   04/12/2014 9:00 AM Gordy Levan, MD [place] [phone]      Follow-up Information   Follow up with Kerin Salen, MD In 2 weeks.   Specialty:  Orthopedic Surgery   Contact information:   [place] [address] [phone]        Signed: Hardin Negus April Colter R 01/06/2014, 7:22 AM

## 2014-01-08 ENCOUNTER — Ambulatory Visit (INDEPENDENT_AMBULATORY_CARE_PROVIDER_SITE_OTHER): Payer: 59 | Admitting: Physical Therapy

## 2014-01-08 DIAGNOSIS — M25569 Pain in unspecified knee: Secondary | ICD-10-CM

## 2014-01-08 DIAGNOSIS — M6281 Muscle weakness (generalized): Secondary | ICD-10-CM

## 2014-01-08 DIAGNOSIS — R269 Unspecified abnormalities of gait and mobility: Secondary | ICD-10-CM

## 2014-01-08 DIAGNOSIS — M25669 Stiffness of unspecified knee, not elsewhere classified: Secondary | ICD-10-CM

## 2014-01-08 DIAGNOSIS — R609 Edema, unspecified: Secondary | ICD-10-CM

## 2014-01-09 ENCOUNTER — Encounter (INDEPENDENT_AMBULATORY_CARE_PROVIDER_SITE_OTHER): Payer: 59 | Admitting: Physical Therapy

## 2014-01-09 DIAGNOSIS — M25669 Stiffness of unspecified knee, not elsewhere classified: Secondary | ICD-10-CM

## 2014-01-09 DIAGNOSIS — R269 Unspecified abnormalities of gait and mobility: Secondary | ICD-10-CM

## 2014-01-09 DIAGNOSIS — M25569 Pain in unspecified knee: Secondary | ICD-10-CM

## 2014-01-09 DIAGNOSIS — M6281 Muscle weakness (generalized): Secondary | ICD-10-CM

## 2014-01-10 ENCOUNTER — Encounter (INDEPENDENT_AMBULATORY_CARE_PROVIDER_SITE_OTHER): Payer: 59

## 2014-01-10 DIAGNOSIS — R269 Unspecified abnormalities of gait and mobility: Secondary | ICD-10-CM

## 2014-01-10 DIAGNOSIS — M25569 Pain in unspecified knee: Secondary | ICD-10-CM

## 2014-01-10 DIAGNOSIS — M25669 Stiffness of unspecified knee, not elsewhere classified: Secondary | ICD-10-CM

## 2014-01-10 DIAGNOSIS — R609 Edema, unspecified: Secondary | ICD-10-CM

## 2014-01-10 DIAGNOSIS — M6281 Muscle weakness (generalized): Secondary | ICD-10-CM

## 2014-01-12 ENCOUNTER — Encounter (INDEPENDENT_AMBULATORY_CARE_PROVIDER_SITE_OTHER): Payer: 59 | Admitting: Physical Therapy

## 2014-01-12 DIAGNOSIS — M6281 Muscle weakness (generalized): Secondary | ICD-10-CM

## 2014-01-12 DIAGNOSIS — M25569 Pain in unspecified knee: Secondary | ICD-10-CM

## 2014-01-12 DIAGNOSIS — R609 Edema, unspecified: Secondary | ICD-10-CM

## 2014-01-12 DIAGNOSIS — M25669 Stiffness of unspecified knee, not elsewhere classified: Secondary | ICD-10-CM

## 2014-01-15 ENCOUNTER — Encounter (INDEPENDENT_AMBULATORY_CARE_PROVIDER_SITE_OTHER): Payer: 59 | Admitting: Physical Therapy

## 2014-01-15 DIAGNOSIS — M25669 Stiffness of unspecified knee, not elsewhere classified: Secondary | ICD-10-CM

## 2014-01-15 DIAGNOSIS — M25569 Pain in unspecified knee: Secondary | ICD-10-CM

## 2014-01-15 DIAGNOSIS — R269 Unspecified abnormalities of gait and mobility: Secondary | ICD-10-CM

## 2014-01-15 DIAGNOSIS — M6281 Muscle weakness (generalized): Secondary | ICD-10-CM

## 2014-01-15 DIAGNOSIS — R609 Edema, unspecified: Secondary | ICD-10-CM

## 2014-01-16 ENCOUNTER — Encounter (INDEPENDENT_AMBULATORY_CARE_PROVIDER_SITE_OTHER): Payer: 59 | Admitting: Physical Therapy

## 2014-01-16 DIAGNOSIS — R269 Unspecified abnormalities of gait and mobility: Secondary | ICD-10-CM

## 2014-01-16 DIAGNOSIS — M25569 Pain in unspecified knee: Secondary | ICD-10-CM

## 2014-01-16 DIAGNOSIS — M25669 Stiffness of unspecified knee, not elsewhere classified: Secondary | ICD-10-CM

## 2014-01-16 DIAGNOSIS — R609 Edema, unspecified: Secondary | ICD-10-CM

## 2014-01-16 DIAGNOSIS — M6281 Muscle weakness (generalized): Secondary | ICD-10-CM

## 2014-01-18 ENCOUNTER — Encounter (INDEPENDENT_AMBULATORY_CARE_PROVIDER_SITE_OTHER): Payer: 59

## 2014-01-18 DIAGNOSIS — M6281 Muscle weakness (generalized): Secondary | ICD-10-CM

## 2014-01-18 DIAGNOSIS — M25569 Pain in unspecified knee: Secondary | ICD-10-CM

## 2014-01-18 DIAGNOSIS — R269 Unspecified abnormalities of gait and mobility: Secondary | ICD-10-CM

## 2014-01-18 DIAGNOSIS — R609 Edema, unspecified: Secondary | ICD-10-CM

## 2014-01-18 DIAGNOSIS — M25669 Stiffness of unspecified knee, not elsewhere classified: Secondary | ICD-10-CM

## 2014-01-19 ENCOUNTER — Encounter: Payer: 59 | Admitting: Physical Therapy

## 2014-01-22 ENCOUNTER — Encounter (INDEPENDENT_AMBULATORY_CARE_PROVIDER_SITE_OTHER): Payer: 59 | Admitting: Physical Therapy

## 2014-01-22 DIAGNOSIS — M25669 Stiffness of unspecified knee, not elsewhere classified: Secondary | ICD-10-CM

## 2014-01-22 DIAGNOSIS — R609 Edema, unspecified: Secondary | ICD-10-CM

## 2014-01-22 DIAGNOSIS — M6281 Muscle weakness (generalized): Secondary | ICD-10-CM

## 2014-01-22 DIAGNOSIS — M25569 Pain in unspecified knee: Secondary | ICD-10-CM

## 2014-01-22 DIAGNOSIS — R269 Unspecified abnormalities of gait and mobility: Secondary | ICD-10-CM

## 2014-01-23 ENCOUNTER — Encounter (INDEPENDENT_AMBULATORY_CARE_PROVIDER_SITE_OTHER): Payer: 59 | Admitting: Physical Therapy

## 2014-01-23 DIAGNOSIS — R269 Unspecified abnormalities of gait and mobility: Secondary | ICD-10-CM

## 2014-01-23 DIAGNOSIS — M25569 Pain in unspecified knee: Secondary | ICD-10-CM

## 2014-01-23 DIAGNOSIS — M12369 Palindromic rheumatism, unspecified knee: Secondary | ICD-10-CM

## 2014-01-23 DIAGNOSIS — M6281 Muscle weakness (generalized): Secondary | ICD-10-CM

## 2014-01-23 DIAGNOSIS — R609 Edema, unspecified: Secondary | ICD-10-CM

## 2014-01-26 ENCOUNTER — Encounter (INDEPENDENT_AMBULATORY_CARE_PROVIDER_SITE_OTHER): Payer: 59 | Admitting: Physical Therapy

## 2014-01-26 DIAGNOSIS — M25569 Pain in unspecified knee: Secondary | ICD-10-CM

## 2014-01-26 DIAGNOSIS — M6281 Muscle weakness (generalized): Secondary | ICD-10-CM

## 2014-01-26 DIAGNOSIS — R609 Edema, unspecified: Secondary | ICD-10-CM

## 2014-01-26 DIAGNOSIS — R269 Unspecified abnormalities of gait and mobility: Secondary | ICD-10-CM

## 2014-01-30 ENCOUNTER — Encounter (INDEPENDENT_AMBULATORY_CARE_PROVIDER_SITE_OTHER): Payer: 59 | Admitting: Physical Therapy

## 2014-01-30 DIAGNOSIS — R609 Edema, unspecified: Secondary | ICD-10-CM

## 2014-01-30 DIAGNOSIS — M6281 Muscle weakness (generalized): Secondary | ICD-10-CM

## 2014-01-30 DIAGNOSIS — M25569 Pain in unspecified knee: Secondary | ICD-10-CM

## 2014-01-30 DIAGNOSIS — M25669 Stiffness of unspecified knee, not elsewhere classified: Secondary | ICD-10-CM

## 2014-01-30 DIAGNOSIS — R269 Unspecified abnormalities of gait and mobility: Secondary | ICD-10-CM

## 2014-01-31 ENCOUNTER — Encounter: Payer: 59 | Admitting: Physical Therapy

## 2014-02-05 ENCOUNTER — Encounter (INDEPENDENT_AMBULATORY_CARE_PROVIDER_SITE_OTHER): Payer: 59 | Admitting: Physical Therapy

## 2014-02-05 DIAGNOSIS — M25569 Pain in unspecified knee: Secondary | ICD-10-CM

## 2014-02-05 DIAGNOSIS — R609 Edema, unspecified: Secondary | ICD-10-CM

## 2014-02-05 DIAGNOSIS — M25669 Stiffness of unspecified knee, not elsewhere classified: Secondary | ICD-10-CM

## 2014-02-05 DIAGNOSIS — M6281 Muscle weakness (generalized): Secondary | ICD-10-CM

## 2014-02-05 DIAGNOSIS — R269 Unspecified abnormalities of gait and mobility: Secondary | ICD-10-CM

## 2014-02-06 ENCOUNTER — Other Ambulatory Visit: Payer: Self-pay | Admitting: Internal Medicine

## 2014-02-06 ENCOUNTER — Encounter (INDEPENDENT_AMBULATORY_CARE_PROVIDER_SITE_OTHER): Payer: 59 | Admitting: Physical Therapy

## 2014-02-06 DIAGNOSIS — R269 Unspecified abnormalities of gait and mobility: Secondary | ICD-10-CM

## 2014-02-06 DIAGNOSIS — R609 Edema, unspecified: Secondary | ICD-10-CM

## 2014-02-06 DIAGNOSIS — M25569 Pain in unspecified knee: Secondary | ICD-10-CM

## 2014-02-06 DIAGNOSIS — M6281 Muscle weakness (generalized): Secondary | ICD-10-CM

## 2014-02-06 DIAGNOSIS — C50919 Malignant neoplasm of unspecified site of unspecified female breast: Secondary | ICD-10-CM

## 2014-02-06 DIAGNOSIS — M25669 Stiffness of unspecified knee, not elsewhere classified: Secondary | ICD-10-CM

## 2014-02-08 ENCOUNTER — Encounter (INDEPENDENT_AMBULATORY_CARE_PROVIDER_SITE_OTHER): Payer: 59 | Admitting: Physical Therapy

## 2014-02-08 DIAGNOSIS — R269 Unspecified abnormalities of gait and mobility: Secondary | ICD-10-CM

## 2014-02-08 DIAGNOSIS — M6281 Muscle weakness (generalized): Secondary | ICD-10-CM

## 2014-02-08 DIAGNOSIS — R609 Edema, unspecified: Secondary | ICD-10-CM

## 2014-02-08 DIAGNOSIS — M25669 Stiffness of unspecified knee, not elsewhere classified: Secondary | ICD-10-CM

## 2014-02-08 DIAGNOSIS — M25569 Pain in unspecified knee: Secondary | ICD-10-CM

## 2014-02-13 ENCOUNTER — Encounter: Payer: 59 | Admitting: Physical Therapy

## 2014-02-14 ENCOUNTER — Encounter (INDEPENDENT_AMBULATORY_CARE_PROVIDER_SITE_OTHER): Payer: 59 | Admitting: Physical Therapy

## 2014-02-14 DIAGNOSIS — R609 Edema, unspecified: Secondary | ICD-10-CM

## 2014-02-14 DIAGNOSIS — M25669 Stiffness of unspecified knee, not elsewhere classified: Secondary | ICD-10-CM

## 2014-02-14 DIAGNOSIS — M6281 Muscle weakness (generalized): Secondary | ICD-10-CM

## 2014-02-14 DIAGNOSIS — R269 Unspecified abnormalities of gait and mobility: Secondary | ICD-10-CM

## 2014-02-14 DIAGNOSIS — M25569 Pain in unspecified knee: Secondary | ICD-10-CM

## 2014-02-15 ENCOUNTER — Encounter (INDEPENDENT_AMBULATORY_CARE_PROVIDER_SITE_OTHER): Payer: 59 | Admitting: Physical Therapy

## 2014-02-15 DIAGNOSIS — M6281 Muscle weakness (generalized): Secondary | ICD-10-CM

## 2014-02-15 DIAGNOSIS — R609 Edema, unspecified: Secondary | ICD-10-CM

## 2014-02-15 DIAGNOSIS — M25569 Pain in unspecified knee: Secondary | ICD-10-CM

## 2014-02-15 DIAGNOSIS — M25669 Stiffness of unspecified knee, not elsewhere classified: Secondary | ICD-10-CM

## 2014-02-15 DIAGNOSIS — R269 Unspecified abnormalities of gait and mobility: Secondary | ICD-10-CM

## 2014-02-19 ENCOUNTER — Encounter (INDEPENDENT_AMBULATORY_CARE_PROVIDER_SITE_OTHER): Payer: 59 | Admitting: Physical Therapy

## 2014-02-19 DIAGNOSIS — M25569 Pain in unspecified knee: Secondary | ICD-10-CM

## 2014-02-19 DIAGNOSIS — R609 Edema, unspecified: Secondary | ICD-10-CM

## 2014-02-19 DIAGNOSIS — M6281 Muscle weakness (generalized): Secondary | ICD-10-CM

## 2014-02-19 DIAGNOSIS — M25669 Stiffness of unspecified knee, not elsewhere classified: Secondary | ICD-10-CM

## 2014-02-19 DIAGNOSIS — R269 Unspecified abnormalities of gait and mobility: Secondary | ICD-10-CM

## 2014-02-22 ENCOUNTER — Encounter: Payer: 59 | Admitting: Physical Therapy

## 2014-02-26 ENCOUNTER — Encounter (INDEPENDENT_AMBULATORY_CARE_PROVIDER_SITE_OTHER): Payer: 59 | Admitting: Physical Therapy

## 2014-02-26 DIAGNOSIS — M6281 Muscle weakness (generalized): Secondary | ICD-10-CM

## 2014-02-26 DIAGNOSIS — R269 Unspecified abnormalities of gait and mobility: Secondary | ICD-10-CM

## 2014-02-26 DIAGNOSIS — M25569 Pain in unspecified knee: Secondary | ICD-10-CM

## 2014-02-26 DIAGNOSIS — M25669 Stiffness of unspecified knee, not elsewhere classified: Secondary | ICD-10-CM

## 2014-02-26 DIAGNOSIS — R609 Edema, unspecified: Secondary | ICD-10-CM

## 2014-02-28 ENCOUNTER — Encounter (INDEPENDENT_AMBULATORY_CARE_PROVIDER_SITE_OTHER): Payer: 59 | Admitting: Physical Therapy

## 2014-02-28 DIAGNOSIS — R609 Edema, unspecified: Secondary | ICD-10-CM

## 2014-02-28 DIAGNOSIS — R269 Unspecified abnormalities of gait and mobility: Secondary | ICD-10-CM

## 2014-02-28 DIAGNOSIS — M6281 Muscle weakness (generalized): Secondary | ICD-10-CM

## 2014-02-28 DIAGNOSIS — M25569 Pain in unspecified knee: Secondary | ICD-10-CM

## 2014-03-01 ENCOUNTER — Encounter (INDEPENDENT_AMBULATORY_CARE_PROVIDER_SITE_OTHER): Payer: 59 | Admitting: Physical Therapy

## 2014-03-01 DIAGNOSIS — R609 Edema, unspecified: Secondary | ICD-10-CM

## 2014-03-01 DIAGNOSIS — M25669 Stiffness of unspecified knee, not elsewhere classified: Secondary | ICD-10-CM

## 2014-03-01 DIAGNOSIS — R269 Unspecified abnormalities of gait and mobility: Secondary | ICD-10-CM

## 2014-03-01 DIAGNOSIS — M25569 Pain in unspecified knee: Secondary | ICD-10-CM

## 2014-03-01 DIAGNOSIS — M6281 Muscle weakness (generalized): Secondary | ICD-10-CM

## 2014-03-02 ENCOUNTER — Encounter: Payer: Self-pay | Admitting: Sports Medicine

## 2014-03-02 ENCOUNTER — Ambulatory Visit (INDEPENDENT_AMBULATORY_CARE_PROVIDER_SITE_OTHER): Payer: 59 | Admitting: Sports Medicine

## 2014-03-02 VITALS — BP 147/86 | HR 108 | Ht 66.0 in | Wt 184.0 lb

## 2014-03-02 DIAGNOSIS — S838X9A Sprain of other specified parts of unspecified knee, initial encounter: Secondary | ICD-10-CM

## 2014-03-02 DIAGNOSIS — S86111A Strain of other muscle(s) and tendon(s) of posterior muscle group at lower leg level, right leg, initial encounter: Secondary | ICD-10-CM | POA: Insufficient documentation

## 2014-03-02 DIAGNOSIS — S86819A Strain of other muscle(s) and tendon(s) at lower leg level, unspecified leg, initial encounter: Secondary | ICD-10-CM

## 2014-03-02 NOTE — Assessment & Plan Note (Signed)
There is a discrete tear at the right medial musculotendinous junction of the gastrocnemius. Strapped with compressive dressing, NSAIDs as needed, formal physical therapy, heel lift. Return to see me in 2 weeks.

## 2014-03-02 NOTE — Progress Notes (Signed)
   Subjective:    I'm seeing this patient as a consultation for:  Dr. 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 Last Tuesday night she felt a pop and a sharp pain in her mid calf while walking to take out the trash. She has noticed an increase in swelling and a lump in the same area. Most painful with dorsiflexion. Pain is severe and she has started using a cane to walk. Denies erythema and tenderness to the area.   Past medical history, Surgical history, Family history not pertinant except as noted below, Social history, Allergies, and medications have been entered into the medical record, reviewed, and no changes needed.   Review of Systems: No headache, visual changes, nausea, vomiting, diarrhea, constipation, dizziness, abdominal pain, skin rash, fevers, chills, night sweats, weight loss, swollen lymph nodes, body aches, joint swelling, muscle aches, chest pain, shortness of breath, mood changes, visual or auditory hallucinations.   Objective:   General: Well Developed, well nourished, and in no acute distress.  Neuro/Psych: Alert and oriented x3, extra-ocular muscles intact, able to move all 4 extremities, sensation grossly intact. Skin: Warm and dry, no rashes noted.  Respiratory: Not using accessory muscles, speaking in full sentences, trachea midline.  Cardiovascular: Pulses palpable, no extremity edema. Abdomen: Does not appear distended. Right lower extremity: No erythema or bruising. Slightly tender to palpation over mid-calf. Pain with dorsiflexion and plantarflexion. Thompson's squeeze test negative.   Procedure: Limited ultrasound of right lower extremity Device: GE logiq E. Findings: There is loss of the normal pennate structure in the gastrocnemius, medial head, at the musculotendinous junction with some hypoechogenicity. Impression: Tear of the  medial head of the gastrocnemius at the musculotendinous junction Images permanently stored in the unit and are available for review.  The calf was strapped with compressive dressing and heel lift was applied in her shoe.  Impression and Recommendations:   This case required medical decision making of moderate complexity.

## 2014-03-05 ENCOUNTER — Encounter (INDEPENDENT_AMBULATORY_CARE_PROVIDER_SITE_OTHER): Payer: 59 | Admitting: Physical Therapy

## 2014-03-05 DIAGNOSIS — R609 Edema, unspecified: Secondary | ICD-10-CM

## 2014-03-05 DIAGNOSIS — M25569 Pain in unspecified knee: Secondary | ICD-10-CM

## 2014-03-05 DIAGNOSIS — R269 Unspecified abnormalities of gait and mobility: Secondary | ICD-10-CM

## 2014-03-05 DIAGNOSIS — M6281 Muscle weakness (generalized): Secondary | ICD-10-CM

## 2014-03-05 DIAGNOSIS — M25669 Stiffness of unspecified knee, not elsewhere classified: Secondary | ICD-10-CM

## 2014-03-07 ENCOUNTER — Encounter (INDEPENDENT_AMBULATORY_CARE_PROVIDER_SITE_OTHER): Payer: 59

## 2014-03-07 DIAGNOSIS — R609 Edema, unspecified: Secondary | ICD-10-CM

## 2014-03-07 DIAGNOSIS — R269 Unspecified abnormalities of gait and mobility: Secondary | ICD-10-CM

## 2014-03-07 DIAGNOSIS — M25569 Pain in unspecified knee: Secondary | ICD-10-CM

## 2014-03-07 DIAGNOSIS — M25669 Stiffness of unspecified knee, not elsewhere classified: Secondary | ICD-10-CM

## 2014-03-07 DIAGNOSIS — M6281 Muscle weakness (generalized): Secondary | ICD-10-CM

## 2014-03-08 ENCOUNTER — Encounter (INDEPENDENT_AMBULATORY_CARE_PROVIDER_SITE_OTHER): Payer: 59

## 2014-03-08 DIAGNOSIS — M25569 Pain in unspecified knee: Secondary | ICD-10-CM

## 2014-03-08 DIAGNOSIS — M25669 Stiffness of unspecified knee, not elsewhere classified: Secondary | ICD-10-CM

## 2014-03-08 DIAGNOSIS — R269 Unspecified abnormalities of gait and mobility: Secondary | ICD-10-CM

## 2014-03-08 DIAGNOSIS — M6281 Muscle weakness (generalized): Secondary | ICD-10-CM

## 2014-03-12 ENCOUNTER — Encounter (INDEPENDENT_AMBULATORY_CARE_PROVIDER_SITE_OTHER): Payer: 59 | Admitting: Physical Therapy

## 2014-03-12 DIAGNOSIS — R269 Unspecified abnormalities of gait and mobility: Secondary | ICD-10-CM

## 2014-03-12 DIAGNOSIS — M25569 Pain in unspecified knee: Secondary | ICD-10-CM

## 2014-03-12 DIAGNOSIS — R609 Edema, unspecified: Secondary | ICD-10-CM

## 2014-03-12 DIAGNOSIS — M6281 Muscle weakness (generalized): Secondary | ICD-10-CM

## 2014-03-12 DIAGNOSIS — M25669 Stiffness of unspecified knee, not elsewhere classified: Secondary | ICD-10-CM

## 2014-03-14 ENCOUNTER — Encounter (INDEPENDENT_AMBULATORY_CARE_PROVIDER_SITE_OTHER): Payer: 59

## 2014-03-14 DIAGNOSIS — M25569 Pain in unspecified knee: Secondary | ICD-10-CM

## 2014-03-14 DIAGNOSIS — R269 Unspecified abnormalities of gait and mobility: Secondary | ICD-10-CM

## 2014-03-14 DIAGNOSIS — M6281 Muscle weakness (generalized): Secondary | ICD-10-CM

## 2014-03-14 DIAGNOSIS — M25669 Stiffness of unspecified knee, not elsewhere classified: Secondary | ICD-10-CM

## 2014-03-14 DIAGNOSIS — R609 Edema, unspecified: Secondary | ICD-10-CM

## 2014-03-15 ENCOUNTER — Encounter: Payer: 59 | Admitting: Physical Therapy

## 2014-03-16 ENCOUNTER — Ambulatory Visit (INDEPENDENT_AMBULATORY_CARE_PROVIDER_SITE_OTHER): Payer: 59 | Admitting: Sports Medicine

## 2014-03-16 ENCOUNTER — Encounter: Payer: Self-pay | Admitting: Sports Medicine

## 2014-03-16 VITALS — BP 151/92 | HR 90 | Ht 66.0 in | Wt 183.0 lb

## 2014-03-16 DIAGNOSIS — M722 Plantar fascial fibromatosis: Secondary | ICD-10-CM

## 2014-03-16 NOTE — Progress Notes (Signed)
  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 This is completely resolved.  Right heel pain: Worse throughout the day, localized at the calcaneal insertion of the plantar fascia, moderate, persistent. She does desire conservative treatment.  Past medical history, Surgical history, Family history not pertinant except as noted below, Social history, Allergies, and medications have been entered into the medical record, reviewed, and no changes needed.   Review of Systems: No fevers, chills, night sweats, weight loss, chest pain, or shortness of breath.   Objective:    General: Well Developed, well nourished, and in no acute distress.  Neuro: Alert and oriented x3, extra-ocular muscles intact, sensation grossly intact.  HEENT: Normocephalic, atraumatic, pupils equal round reactive to light, neck supple, no masses, no lymphadenopathy, thyroid nonpalpable.  Skin: Warm and dry, no rashes. Cardiac: Regular rate and rhythm, no murmurs rubs or gallops, no lower extremity edema.  Respiratory: Clear to auscultation bilaterally. Not using accessory muscles, speaking in full sentences. Right Foot: No visible erythema or swelling. Range of motion is full in all directions. Strength is 5/5 in all directions. No hallux valgus. No pes cavus or pes planus. No abnormal callus noted. No pain over the navicular prominence, or base of fifth metatarsal. Tender to palpation of the calcaneal insertion of plantar fascia. No pain at the Achilles insertion. No pain over the calcaneal bursa. No pain of the retrocalcaneal bursa. No tenderness to palpation over the tarsals, metatarsals, or phalanges. No hallux rigidus or limitus. No tenderness palpation over interphalangeal joints. No pain with compression of the metatarsal heads. Neurovascularly intact distally.  Impression and Recommendations:

## 2014-03-16 NOTE — Assessment & Plan Note (Signed)
We will start conservatively with formal PT, continue Celebrex, continue store bought orthotics. Return to see me in one month, if no better we can consider interventional treatment and custom orthotics.

## 2014-03-19 ENCOUNTER — Encounter: Payer: 59 | Admitting: Physical Therapy

## 2014-03-19 ENCOUNTER — Other Ambulatory Visit: Payer: Self-pay | Admitting: Internal Medicine

## 2014-03-19 ENCOUNTER — Other Ambulatory Visit: Payer: Self-pay | Admitting: Family Medicine

## 2014-03-19 NOTE — Telephone Encounter (Signed)
Let her know that I called in a one-month supply. The record indicates she has not had a thyroid function test in well over a year

## 2014-03-19 NOTE — Telephone Encounter (Signed)
Is this ok to refill?  

## 2014-03-20 ENCOUNTER — Encounter: Payer: Self-pay | Admitting: Family Medicine

## 2014-03-20 ENCOUNTER — Ambulatory Visit (INDEPENDENT_AMBULATORY_CARE_PROVIDER_SITE_OTHER): Payer: 59 | Admitting: Family Medicine

## 2014-03-20 VITALS — BP 140/70 | HR 88 | Wt 183.0 lb

## 2014-03-20 DIAGNOSIS — F341 Dysthymic disorder: Secondary | ICD-10-CM

## 2014-03-20 DIAGNOSIS — M722 Plantar fascial fibromatosis: Secondary | ICD-10-CM

## 2014-03-20 DIAGNOSIS — E039 Hypothyroidism, unspecified: Secondary | ICD-10-CM

## 2014-03-20 DIAGNOSIS — F988 Other specified behavioral and emotional disorders with onset usually occurring in childhood and adolescence: Secondary | ICD-10-CM

## 2014-03-20 DIAGNOSIS — F900 Attention-deficit hyperactivity disorder, predominantly inattentive type: Secondary | ICD-10-CM

## 2014-03-20 DIAGNOSIS — Z79899 Other long term (current) drug therapy: Secondary | ICD-10-CM

## 2014-03-20 LAB — CBC WITH DIFFERENTIAL/PLATELET
BASOS ABS: 0.1 10*3/uL (ref 0.0–0.1)
Basophils Relative: 1 % (ref 0–1)
Eosinophils Absolute: 0.2 10*3/uL (ref 0.0–0.7)
Eosinophils Relative: 3 % (ref 0–5)
HCT: 42.2 % (ref 36.0–46.0)
Hemoglobin: 13.9 g/dL (ref 12.0–15.0)
Lymphocytes Relative: 21 % (ref 12–46)
Lymphs Abs: 1.2 10*3/uL (ref 0.7–4.0)
MCH: 28.2 pg (ref 26.0–34.0)
MCHC: 32.9 g/dL (ref 30.0–36.0)
MCV: 85.6 fL (ref 78.0–100.0)
Monocytes Absolute: 0.6 10*3/uL (ref 0.1–1.0)
Monocytes Relative: 11 % (ref 3–12)
NEUTROS PCT: 64 % (ref 43–77)
Neutro Abs: 3.8 10*3/uL (ref 1.7–7.7)
Platelets: 180 10*3/uL (ref 150–400)
RBC: 4.93 MIL/uL (ref 3.87–5.11)
RDW: 14 % (ref 11.5–15.5)
WBC: 5.9 10*3/uL (ref 4.0–10.5)

## 2014-03-20 LAB — LIPID PANEL
CHOLESTEROL: 156 mg/dL (ref 0–200)
HDL: 58 mg/dL (ref >39)
LDL Cholesterol: 81 mg/dL (ref 0–99)
Total CHOL/HDL Ratio: 2.7 Ratio
Triglycerides: 85 mg/dL (ref <150)
VLDL: 17 mg/dL (ref 0–40)

## 2014-03-20 LAB — COMPREHENSIVE METABOLIC PANEL
ALT: 20 U/L (ref 0–35)
AST: 20 U/L (ref 0–37)
Albumin: 4.2 g/dL (ref 3.5–5.2)
Alkaline Phosphatase: 125 U/L — ABNORMAL HIGH (ref 39–117)
BUN: 10 mg/dL (ref 6–23)
CO2: 27 meq/L (ref 19–32)
CREATININE: 0.56 mg/dL (ref 0.50–1.10)
Calcium: 9.2 mg/dL (ref 8.4–10.5)
Chloride: 102 mEq/L (ref 96–112)
Glucose, Bld: 93 mg/dL (ref 70–99)
Potassium: 4.4 mEq/L (ref 3.5–5.3)
Sodium: 138 mEq/L (ref 135–145)
Total Bilirubin: 0.4 mg/dL (ref 0.2–1.2)
Total Protein: 6.4 g/dL (ref 6.0–8.3)

## 2014-03-20 LAB — TSH: TSH: 2.505 u[IU]/mL (ref 0.350–4.500)

## 2014-03-20 MED ORDER — VENLAFAXINE HCL ER 150 MG PO CP24: 150.0000 mg | ORAL_CAPSULE | Freq: Every day | ORAL | Status: DC

## 2014-03-20 MED ORDER — LISDEXAMFETAMINE DIMESYLATE 60 MG PO CAPS: 60.0000 mg | ORAL_CAPSULE | ORAL | Status: DC

## 2014-03-20 NOTE — Telephone Encounter (Signed)
Pt scheduled for 3:30 today 03/20/14

## 2014-03-21 ENCOUNTER — Encounter: Payer: 59 | Admitting: Physical Therapy

## 2014-03-21 ENCOUNTER — Ambulatory Visit (INDEPENDENT_AMBULATORY_CARE_PROVIDER_SITE_OTHER): Payer: 59 | Admitting: Physical Therapy

## 2014-03-21 DIAGNOSIS — M25676 Stiffness of unspecified foot, not elsewhere classified: Secondary | ICD-10-CM

## 2014-03-21 DIAGNOSIS — R269 Unspecified abnormalities of gait and mobility: Secondary | ICD-10-CM

## 2014-03-21 DIAGNOSIS — M722 Plantar fascial fibromatosis: Secondary | ICD-10-CM

## 2014-03-21 DIAGNOSIS — M25673 Stiffness of unspecified ankle, not elsewhere classified: Secondary | ICD-10-CM

## 2014-03-21 DIAGNOSIS — M25579 Pain in unspecified ankle and joints of unspecified foot: Secondary | ICD-10-CM

## 2014-03-21 NOTE — Progress Notes (Signed)
   Subjective:    Patient ID: Christy [name], female    DOB: 01-Aug-1956, 58 y.o.   MRN: [id number]  HPI She is here for medication check. She has had difficulty recently with plantar fasciitis and currently is involved in conservative care for this. Her only she is making some progress with this. She continues on Effexor. Originally she was placed on it to help with menopausal symptoms however this also helps her psychologically and at this point she is not interested in stopping. She continues to do quite well on her Vyvanse 60 mg. It allows her to stay focused during her entire stay. She does work with [facility]. He also continues on her thyroid medication and is having no difficulty with that. She has no other concerns or complaints.   Review of Systems     Objective:   Physical Exam Alert and in no distress otherwise not examined.       Assessment & Plan:  Plantar fasciitis, right  Encounter for long-term (current) use of other medications - Plan: Comprehensive metabolic panel, Lipid panel, CBC with Differential, TSH  ADD (attention deficit hyperactivity disorder, inattentive type) - Plan: lisdexamfetamine (VYVANSE) 60 MG capsule  Dysthymia - Plan: venlafaxine XR (EFFEXOR-XR) 150 MG 24 hr capsule  Hypothyroid - Plan: TSH

## 2014-03-22 ENCOUNTER — Encounter: Payer: 59 | Admitting: Physical Therapy

## 2014-03-26 ENCOUNTER — Encounter: Payer: 59 | Admitting: Physical Therapy

## 2014-03-26 ENCOUNTER — Encounter (INDEPENDENT_AMBULATORY_CARE_PROVIDER_SITE_OTHER): Payer: 59 | Admitting: Physical Therapy

## 2014-03-26 DIAGNOSIS — M25673 Stiffness of unspecified ankle, not elsewhere classified: Secondary | ICD-10-CM

## 2014-03-26 DIAGNOSIS — M722 Plantar fascial fibromatosis: Secondary | ICD-10-CM

## 2014-03-26 DIAGNOSIS — R269 Unspecified abnormalities of gait and mobility: Secondary | ICD-10-CM

## 2014-03-26 DIAGNOSIS — M25676 Stiffness of unspecified foot, not elsewhere classified: Secondary | ICD-10-CM

## 2014-03-28 ENCOUNTER — Encounter: Payer: 59 | Admitting: Physical Therapy

## 2014-03-29 ENCOUNTER — Encounter: Payer: 59 | Admitting: Physical Therapy

## 2014-04-08 ENCOUNTER — Other Ambulatory Visit: Payer: Self-pay | Admitting: Oncology

## 2014-04-11 ENCOUNTER — Other Ambulatory Visit: Payer: 59

## 2014-04-11 ENCOUNTER — Encounter (INDEPENDENT_AMBULATORY_CARE_PROVIDER_SITE_OTHER): Payer: 59

## 2014-04-11 DIAGNOSIS — M25676 Stiffness of unspecified foot, not elsewhere classified: Secondary | ICD-10-CM

## 2014-04-11 DIAGNOSIS — M25673 Stiffness of unspecified ankle, not elsewhere classified: Secondary | ICD-10-CM

## 2014-04-11 DIAGNOSIS — M722 Plantar fascial fibromatosis: Secondary | ICD-10-CM

## 2014-04-11 DIAGNOSIS — R269 Unspecified abnormalities of gait and mobility: Secondary | ICD-10-CM

## 2014-04-11 DIAGNOSIS — M25579 Pain in unspecified ankle and joints of unspecified foot: Secondary | ICD-10-CM

## 2014-04-12 ENCOUNTER — Telehealth: Payer: Self-pay | Admitting: Oncology

## 2014-04-12 ENCOUNTER — Encounter: Payer: Self-pay | Admitting: Oncology

## 2014-04-12 ENCOUNTER — Other Ambulatory Visit: Payer: 59

## 2014-04-12 ENCOUNTER — Ambulatory Visit (HOSPITAL_BASED_OUTPATIENT_CLINIC_OR_DEPARTMENT_OTHER): Payer: 59 | Admitting: Oncology

## 2014-04-12 VITALS — BP 138/82 | HR 88 | Temp 98.7°F | Resp 18 | Ht 66.0 in | Wt 183.0 lb

## 2014-04-12 DIAGNOSIS — Z853 Personal history of malignant neoplasm of breast: Secondary | ICD-10-CM

## 2014-04-12 DIAGNOSIS — M949 Disorder of cartilage, unspecified: Secondary | ICD-10-CM

## 2014-04-12 DIAGNOSIS — C50911 Malignant neoplasm of unspecified site of right female breast: Secondary | ICD-10-CM

## 2014-04-12 DIAGNOSIS — M8580 Other specified disorders of bone density and structure, unspecified site: Secondary | ICD-10-CM

## 2014-04-12 DIAGNOSIS — D508 Other iron deficiency anemias: Secondary | ICD-10-CM

## 2014-04-12 DIAGNOSIS — C50919 Malignant neoplasm of unspecified site of unspecified female breast: Secondary | ICD-10-CM

## 2014-04-12 DIAGNOSIS — Z9884 Bariatric surgery status: Secondary | ICD-10-CM

## 2014-04-12 DIAGNOSIS — C50419 Malignant neoplasm of upper-outer quadrant of unspecified female breast: Secondary | ICD-10-CM

## 2014-04-12 DIAGNOSIS — M899 Disorder of bone, unspecified: Secondary | ICD-10-CM

## 2014-04-12 MED ORDER — LETROZOLE 2.5 MG PO TABS: ORAL_TABLET | ORAL | Status: DC

## 2014-04-12 NOTE — Telephone Encounter (Signed)
, °

## 2014-04-12 NOTE — Progress Notes (Signed)
OFFICE PROGRESS NOTE   04/12/2014   Physicians: J.Lalonde, C.Streck, (J.Tomblin), V.Paul/ F.Mayer Camel, D.Brodie  INTERVAL HISTORY:  Patient is seen, alone for visit, in continuing 6 month follow up of adjuvant aromatase inhibitor for second primary right breast cancer.  She has been on Femara since Aug 2008, prefers to continue this for now as there is some indication that treatment beyond 5 years may be beneficial from cancer standpoint. She has osteopenia and understands that she needs yearly bone density scans due to ongoing high risk medication. She takes Vitamin D, prefers not to take supplemental calcium, decided not to take Boniva.  Fortunately she has had excellent results from right knee arthroplasty by Dr Mayer Camel 11-2013, tho did have gastrocnemius tear RLE in 02-2014. She continues outpatient therapy at [facility], which is also needed for bone density concerns.  She has no complaints that seem related to the breast cancer history or that treatment. She occasionally has upper back muscular spasms as she has had. She had labs including fasting lipids by Dr Redmond School, reviewed in EMR.  Younger son Inocente Salles is Museum/gallery exhibitions officer at [employer]. Older son is EMT and applying to nursing school   ONCOLOGIC HISTORY History is of 2 primary right breast cancers, in 1993 and May 2008. The initial right breast cancer was stage 2 with 3 of 41 nodes involved, ER/PR negative and HER-2 not done in 1993. She was treated by Dr.John Terrall Laity on NSABP B25 with adriamycin/cytoxan. The most recent breast cancer was 0.6 cm invasive ductal with 3 nodes negative, ER/PR + and HER2 -. She had right mastectomy and prophylactic left mastectomy, with bilateral reconstructions. She additionally had hysterectomy with BSO. She has been on Femara since Aug 2008, which she is tolerating well. The bone density scan 10-04-13 had T scores -2.1 and -2.2, having been -1.7 and -1.9 in 2013.  She had IV feraheme 03-2013 related to  iron deficiency from gastric bypass.  Review of systems as above, also: No recent infectious illness. No respiratory or GI concerns. No LE swelling. No bleeding. Remainder of 10 point Review of Systems negative.  Objective:  Vital signs in last 24 hours:  BP 138/82  Pulse 88  Temp(Src) 98.7 F (37.1 C) (Oral)  Resp 18  Ht '5\' 6"'  (1.676 m)  Wt 183 lb (83.008 kg)  BMI 29.55 kg/m2 Weight is down 1 lb Alert, oriented and appropriate. Ambulatory without difficulty, much more easily than when I saw her last No alopecia  HEENT:PERRL, sclerae not icteric. Oral mucosa moist without lesions, posterior pharynx clear.  Neck supple. No JVD.  Lymphatics:no cervical,suraclavicular, axillary or inguinal adenopathy Resp: clear to auscultation bilaterally and normal percussion bilaterally Cardio: regular rate and rhythm. No gallop. GI: soft, nontender, not distended, no mass or organomegaly. Normally active bowel sounds.  Musculoskeletal/ Extremities: without pitting edema, cords, tenderness. Right knee surgical scar appears to be well healed, good and easy ROM at knee. Neuro: no peripheral neuropathy. Otherwise nonfocal. Psych: appropriate mood and affect Skin without rash, ecchymosis, petechiae Breasts: bilateral reconstructions without dominant mass or skin findings. Axillae benign.   Lab Results:  Results for orders placed in visit on 03/20/14  COMPREHENSIVE METABOLIC PANEL      Result Value Ref Range   Sodium 138  135 - 145 mEq/L   Potassium 4.4  3.5 - 5.3 mEq/L   Chloride 102  96 - 112 mEq/L   CO2 27  19 - 32 mEq/L   Glucose, Bld 93  70 - 99  mg/dL   BUN 10  6 - 23 mg/dL   Creat 0.56  0.50 - 1.10 mg/dL   Total Bilirubin 0.4  0.2 - 1.2 mg/dL   Alkaline Phosphatase 125 (*) 39 - 117 U/L   AST 20  0 - 37 U/L   ALT 20  0 - 35 U/L   Total Protein 6.4  6.0 - 8.3 g/dL   Albumin 4.2  3.5 - 5.2 g/dL   Calcium 9.2  8.4 - 10.5 mg/dL  LIPID PANEL      Result Value Ref Range   Cholesterol  156  0 - 200 mg/dL   Triglycerides 85  <150 mg/dL   HDL 58  >39 mg/dL   Total CHOL/HDL Ratio 2.7     VLDL 17  0 - 40 mg/dL   LDL Cholesterol 81  0 - 99 mg/dL  CBC WITH DIFFERENTIAL      Result Value Ref Range   WBC 5.9  4.0 - 10.5 K/uL   RBC 4.93  3.87 - 5.11 MIL/uL   Hemoglobin 13.9  12.0 - 15.0 g/dL   HCT 42.2  36.0 - 46.0 %   MCV 85.6  78.0 - 100.0 fL   MCH 28.2  26.0 - 34.0 pg   MCHC 32.9  30.0 - 36.0 g/dL   RDW 14.0  11.5 - 15.5 %   Platelets 180  150 - 400 K/uL   Neutrophils Relative % 64  43 - 77 %   Neutro Abs 3.8  1.7 - 7.7 K/uL   Lymphocytes Relative 21  12 - 46 %   Lymphs Abs 1.2  0.7 - 4.0 K/uL   Monocytes Relative 11  3 - 12 %   Monocytes Absolute 0.6  0.1 - 1.0 K/uL   Eosinophils Relative 3  0 - 5 %   Eosinophils Absolute 0.2  0.0 - 0.7 K/uL   Basophils Relative 1  0 - 1 %   Basophils Absolute 0.1  0.0 - 0.1 K/uL   Smear Review Criteria for review not met    TSH      Result Value Ref Range   TSH 2.505  0.350 - 4.500 uIU/mL   Labs reviewed with patient.  Studies/Results: EXAM:  CHEST 2 VIEW 12-13-2013 COMPARISON: 03/14/2009; CT abdomen pelvis -01/27/2010  FINDINGS:  Grossly unchanged cardiac silhouette and mediastinal contours with  mild tortuosity of the thoracic aorta. The lungs appear  hyperexpanded with flattening of the bilateral mid diaphragms mild  diffuse slightly nodular thickening of the pulmonary interstitium.  No focal airspace opacities. No pleural effusion or pneumothorax. No  evidence of edema. Unchanged bones. Post bilateral mastectomy and  breast augmentation. Multiple surgical clips overlie the upper  abdomen.  IMPRESSION:  Hyperexpanded lungs and bronchitic change without acute  cardiopulmonary disease.   Medications: I have reviewed the patient's current medications.  DISCUSSION: she is in agreement with continuing Femara for now. She needs bone density scan Oct 2015 and I will see her back in 6 mo or sooner if  needed.  Assessment/Plan:  1.History of 2 primary right breast cancer as above: continuing Femara. I will see her back in 6 months or sooner if needed.  2.Osteopenia preceeding aromatase inhibitor: Risk factors include ongoing aromatase inhibitor, gastric bypass, lack of exercise. I have encouraged her to continue good exercise. 4.hysterectomy with oophorectomy  5.hypothyroidism on replacement  6.orthopedic problems with knee  7.prophylactic left mastectomy, bilateral breast reconstructions 8.previous iron deficiency related to gastric bypass: had IV feraheme  04-06-13. I will check ferritin and B12 (also due to gastric bypass) with next labs here. Hemoglobin and MCV good now.      Gordy Levan, MD   04/12/2014, 10:46 AM

## 2014-04-19 ENCOUNTER — Encounter (INDEPENDENT_AMBULATORY_CARE_PROVIDER_SITE_OTHER): Payer: 59 | Admitting: Physical Therapy

## 2014-04-19 DIAGNOSIS — M25673 Stiffness of unspecified ankle, not elsewhere classified: Secondary | ICD-10-CM

## 2014-04-19 DIAGNOSIS — M25676 Stiffness of unspecified foot, not elsewhere classified: Secondary | ICD-10-CM

## 2014-04-19 DIAGNOSIS — M25579 Pain in unspecified ankle and joints of unspecified foot: Secondary | ICD-10-CM

## 2014-04-19 DIAGNOSIS — M722 Plantar fascial fibromatosis: Secondary | ICD-10-CM

## 2014-04-19 DIAGNOSIS — R269 Unspecified abnormalities of gait and mobility: Secondary | ICD-10-CM

## 2014-04-26 ENCOUNTER — Encounter (INDEPENDENT_AMBULATORY_CARE_PROVIDER_SITE_OTHER): Payer: 59 | Admitting: Physical Therapy

## 2014-04-26 DIAGNOSIS — M25676 Stiffness of unspecified foot, not elsewhere classified: Secondary | ICD-10-CM

## 2014-04-26 DIAGNOSIS — M25673 Stiffness of unspecified ankle, not elsewhere classified: Secondary | ICD-10-CM

## 2014-04-26 DIAGNOSIS — M722 Plantar fascial fibromatosis: Secondary | ICD-10-CM

## 2014-04-26 DIAGNOSIS — M25579 Pain in unspecified ankle and joints of unspecified foot: Secondary | ICD-10-CM

## 2014-04-26 DIAGNOSIS — R269 Unspecified abnormalities of gait and mobility: Secondary | ICD-10-CM

## 2014-04-30 ENCOUNTER — Encounter: Payer: 59 | Admitting: Physical Therapy

## 2014-05-03 ENCOUNTER — Encounter: Payer: 59 | Admitting: Physical Therapy

## 2014-05-10 ENCOUNTER — Encounter: Payer: 59 | Admitting: Physical Therapy

## 2014-05-11 ENCOUNTER — Encounter (INDEPENDENT_AMBULATORY_CARE_PROVIDER_SITE_OTHER): Payer: 59 | Admitting: Physical Therapy

## 2014-05-11 DIAGNOSIS — M25676 Stiffness of unspecified foot, not elsewhere classified: Secondary | ICD-10-CM

## 2014-05-11 DIAGNOSIS — M722 Plantar fascial fibromatosis: Secondary | ICD-10-CM

## 2014-05-11 DIAGNOSIS — M25579 Pain in unspecified ankle and joints of unspecified foot: Secondary | ICD-10-CM

## 2014-05-11 DIAGNOSIS — R269 Unspecified abnormalities of gait and mobility: Secondary | ICD-10-CM

## 2014-05-11 DIAGNOSIS — M25673 Stiffness of unspecified ankle, not elsewhere classified: Secondary | ICD-10-CM

## 2014-05-14 ENCOUNTER — Other Ambulatory Visit: Payer: Self-pay | Admitting: Family Medicine

## 2014-05-14 ENCOUNTER — Other Ambulatory Visit: Payer: Self-pay | Admitting: Oncology

## 2014-05-14 DIAGNOSIS — C50919 Malignant neoplasm of unspecified site of unspecified female breast: Secondary | ICD-10-CM

## 2014-05-14 NOTE — Telephone Encounter (Signed)
Is this ok to refill?  

## 2014-05-17 ENCOUNTER — Encounter: Payer: 59 | Admitting: Physical Therapy

## 2014-06-18 ENCOUNTER — Telehealth: Payer: Self-pay | Admitting: Family Medicine

## 2014-06-18 DIAGNOSIS — F900 Attention-deficit hyperactivity disorder, predominantly inattentive type: Secondary | ICD-10-CM

## 2014-06-18 MED ORDER — LISDEXAMFETAMINE DIMESYLATE 60 MG PO CAPS: 60.0000 mg | ORAL_CAPSULE | ORAL | Status: DC

## 2014-06-18 NOTE — Telephone Encounter (Signed)
Have her come by and pick up the Vyvanse. I have no notes in my chart concerning her getting Flexeril.

## 2014-06-18 NOTE — Telephone Encounter (Signed)
Vyvanse was renewed. She also asked for Flexeril however have no notes of her getting this.

## 2014-06-18 NOTE — Telephone Encounter (Signed)
Pt.notified

## 2014-09-06 ENCOUNTER — Other Ambulatory Visit: Payer: Self-pay | Admitting: Internal Medicine

## 2014-10-03 ENCOUNTER — Telehealth: Payer: Self-pay | Admitting: Internal Medicine

## 2014-10-03 DIAGNOSIS — F900 Attention-deficit hyperactivity disorder, predominantly inattentive type: Secondary | ICD-10-CM

## 2014-10-03 MED ORDER — LISDEXAMFETAMINE DIMESYLATE 60 MG PO CAPS: 60.0000 mg | ORAL_CAPSULE | ORAL | Status: DC

## 2014-10-03 NOTE — Telephone Encounter (Signed)
Pt needs a refill on vyvanse 60mg  for a 90 day supply. Pt now works in [place] so she is going to get her fiance to pick it up but she wants to be called when its ready. She will be out Friday of her pills.

## 2014-10-04 ENCOUNTER — Telehealth: Payer: Self-pay | Admitting: Internal Medicine

## 2014-10-04 DIAGNOSIS — F900 Attention-deficit hyperactivity disorder, predominantly inattentive type: Secondary | ICD-10-CM

## 2014-10-04 MED ORDER — LISDEXAMFETAMINE DIMESYLATE 60 MG PO CAPS: 60.0000 mg | ORAL_CAPSULE | ORAL | Status: DC

## 2014-10-04 NOTE — Telephone Encounter (Signed)
Called pt to come pick up rx and fiance will come pick it up

## 2014-10-04 NOTE — Telephone Encounter (Signed)
Signed.

## 2014-10-04 NOTE — Telephone Encounter (Signed)
Dr. Redmond School wrote 30 days on the rx pad but ment to write 90 days. I called Dr. Redmond School and he told me to get someone else to sign for the 90 day supply

## 2014-10-13 ENCOUNTER — Telehealth: Payer: Self-pay | Admitting: Oncology

## 2014-10-13 NOTE — Telephone Encounter (Signed)
, °

## 2014-10-27 ENCOUNTER — Other Ambulatory Visit: Payer: Self-pay | Admitting: Oncology

## 2014-10-27 DIAGNOSIS — M8580 Other specified disorders of bone density and structure, unspecified site: Secondary | ICD-10-CM

## 2014-10-29 ENCOUNTER — Other Ambulatory Visit: Payer: 59

## 2014-10-29 ENCOUNTER — Telehealth: Payer: Self-pay | Admitting: Oncology

## 2014-10-29 ENCOUNTER — Ambulatory Visit: Payer: 59 | Admitting: Oncology

## 2014-10-29 NOTE — Telephone Encounter (Signed)
pt called abnd r/s from 11/2 to 11/11 per pt - pt has new d/t

## 2014-11-07 ENCOUNTER — Telehealth: Payer: Self-pay | Admitting: Oncology

## 2014-11-07 ENCOUNTER — Other Ambulatory Visit (HOSPITAL_BASED_OUTPATIENT_CLINIC_OR_DEPARTMENT_OTHER): Payer: 59

## 2014-11-07 ENCOUNTER — Ambulatory Visit (HOSPITAL_BASED_OUTPATIENT_CLINIC_OR_DEPARTMENT_OTHER): Payer: 59 | Admitting: Oncology

## 2014-11-07 ENCOUNTER — Encounter: Payer: Self-pay | Admitting: Oncology

## 2014-11-07 VITALS — BP 135/72 | HR 85 | Temp 98.3°F | Resp 20 | Ht 66.0 in | Wt 181.7 lb

## 2014-11-07 DIAGNOSIS — D508 Other iron deficiency anemias: Secondary | ICD-10-CM

## 2014-11-07 DIAGNOSIS — C50919 Malignant neoplasm of unspecified site of unspecified female breast: Secondary | ICD-10-CM

## 2014-11-07 DIAGNOSIS — C50411 Malignant neoplasm of upper-outer quadrant of right female breast: Secondary | ICD-10-CM

## 2014-11-07 DIAGNOSIS — M8580 Other specified disorders of bone density and structure, unspecified site: Secondary | ICD-10-CM

## 2014-11-07 DIAGNOSIS — B001 Herpesviral vesicular dermatitis: Secondary | ICD-10-CM

## 2014-11-07 DIAGNOSIS — Z9884 Bariatric surgery status: Secondary | ICD-10-CM

## 2014-11-07 DIAGNOSIS — Z853 Personal history of malignant neoplasm of breast: Secondary | ICD-10-CM

## 2014-11-07 DIAGNOSIS — Z79899 Other long term (current) drug therapy: Secondary | ICD-10-CM

## 2014-11-07 DIAGNOSIS — Y832 Surgical operation with anastomosis, bypass or graft as the cause of abnormal reaction of the patient, or of later complication, without mention of misadventure at the time of the procedure: Secondary | ICD-10-CM

## 2014-11-07 DIAGNOSIS — K9189 Other postprocedural complications and disorders of digestive system: Secondary | ICD-10-CM

## 2014-11-07 LAB — COMPREHENSIVE METABOLIC PANEL (CC13)
ALT: 33 U/L (ref 0–55)
ANION GAP: 8 meq/L (ref 3–11)
AST: 23 U/L (ref 5–34)
Albumin: 3.8 g/dL (ref 3.5–5.0)
Alkaline Phosphatase: 158 U/L — ABNORMAL HIGH (ref 40–150)
BUN: 8.1 mg/dL (ref 7.0–26.0)
CHLORIDE: 104 meq/L (ref 98–109)
CO2: 27 meq/L (ref 22–29)
Calcium: 9.7 mg/dL (ref 8.4–10.4)
Creatinine: 0.7 mg/dL (ref 0.6–1.1)
Glucose: 103 mg/dl (ref 70–140)
Potassium: 4.6 mEq/L (ref 3.5–5.1)
SODIUM: 140 meq/L (ref 136–145)
Total Bilirubin: 0.47 mg/dL (ref 0.20–1.20)
Total Protein: 6.5 g/dL (ref 6.4–8.3)

## 2014-11-07 LAB — CBC WITH DIFFERENTIAL/PLATELET
BASO%: 1.2 % (ref 0.0–2.0)
Basophils Absolute: 0.1 10*3/uL (ref 0.0–0.1)
EOS%: 4.7 % (ref 0.0–7.0)
Eosinophils Absolute: 0.3 10*3/uL (ref 0.0–0.5)
HCT: 44.7 % (ref 34.8–46.6)
HGB: 14.4 g/dL (ref 11.6–15.9)
LYMPH%: 23.2 % (ref 14.0–49.7)
MCH: 28.7 pg (ref 25.1–34.0)
MCHC: 32.2 g/dL (ref 31.5–36.0)
MCV: 89.1 fL (ref 79.5–101.0)
MONO#: 0.5 10*3/uL (ref 0.1–0.9)
MONO%: 8.2 % (ref 0.0–14.0)
NEUT%: 62.7 % (ref 38.4–76.8)
NEUTROS ABS: 3.7 10*3/uL (ref 1.5–6.5)
Platelets: 168 10*3/uL (ref 145–400)
RBC: 5.02 10*6/uL (ref 3.70–5.45)
RDW: 13.4 % (ref 11.2–14.5)
WBC: 5.8 10*3/uL (ref 3.9–10.3)
lymph#: 1.4 10*3/uL (ref 0.9–3.3)

## 2014-11-07 LAB — FERRITIN CHCC: Ferritin: 34 ng/ml (ref 9–269)

## 2014-11-07 MED ORDER — VALACYCLOVIR HCL 500 MG PO TABS: 1000.0000 mg | ORAL_TABLET | Freq: Every day | ORAL | Status: DC | PRN

## 2014-11-07 MED ORDER — CYCLOBENZAPRINE HCL 10 MG PO TABS: 10.0000 mg | ORAL_TABLET | Freq: Three times a day (TID) | ORAL | Status: DC | PRN

## 2014-11-07 NOTE — Telephone Encounter (Signed)
per [facility] to sch pt appt-[facility] not opened-adv pt will call w/appt-gave to Anne to sch when sch opens

## 2014-11-07 NOTE — Progress Notes (Signed)
OFFICE PROGRESS NOTE   11/07/2014   Physicians:J.Lalonde, C.Streck, (J.Tomblin), V.Paul, D.Brodie  INTERVAL HISTORY:  Patient is seen, alone for visit, in scheduled 6 month follow up of two primary right breast cancers, now on adjuvant Femara since August 2008. She has had bilateral mastectomies (with reconstructions) so does not have mammograms; last bone density scan was at [facility] 10-04-13 with T scores in spine -2.1 and femur -2.2.  Mathew has been well overall recently, with outpatient psychiatric clinic less stressful and knee surgery very successful and helpful. She is not exercising and is not eating as well as ideal, not on supplemental Ca++ and not on bisphosphonate but is on supplemental D.  She had IV feraheme 03-2013 for iron deficiency related to gastric bypass.  No PAC Flu vaccine done I believe that she had negative genetics testing prior to second breast cancer surgery, however those records are not presently available out of prior EMR.  66 yo son with juvenille diabetes just recovered from peritonsillar abscess and DKA, sophomore at [employer]. Thuman is engaged to friend whom she has known since kindergarten.   ONCOLOGIC HISTORY History is of 2 primary right breast cancers, in 1993 and May 2008. The initial right breast cancer was stage 2 with 3 of 41 nodes involved, ER/PR negative and HER-2 not done in 1993. She was treated by Dr.John Terrall Laity on NSABP B25 with adriamycin/cytoxan. The most recent breast cancer 06-2007 was 0.6 cm invasive ductal with 3 nodes negative, ER/PR + and HER2 -. She had right mastectomy and prophylactic left mastectomy , with bilateral reconstructions. She additionally had hysterectomy with BSO. She has been on Femara since August 2008, to be DC 10-2014 (see below).  Review of systems as above, also: No changes in reconstructed breasts. More bothersome vaginal dryness. No new or different pain. No GI symptoms. No bleeding. No recent infectious  illness. Remainder of 10 point Review of Systems negative.  Objective:  Vital signs in last 24 hours:  BP 135/72 mmHg  Pulse 85  Temp(Src) 98.3 F (36.8 C) (Oral)  Resp 20  Ht '5\' 6"'  (1.676 m)  Wt 181 lb 11.2 oz (82.419 kg)  BMI 29.34 kg/m2 Weight down 1 lb. Alert, oriented and appropriate, looks more relaxed today. Ambulatory without difficulty.   HEENT:PERRL, sclerae not icteric. Oral mucosa moist without lesions, posterior pharynx clear.  Neck supple. No JVD.  Lymphatics:no cervical,suraclavicular, axillary adenopathy Resp: clear to auscultation bilaterally and normal percussion bilaterally Cardio: regular rate and rhythm. No gallop. GI: soft, nontender, not distended, no mass or organomegaly. Normally active bowel sounds. Surgical incision not remarkable. Musculoskeletal/ Extremities: without pitting edema, cords, tenderness Neuro: nonfocal Skin without rash, ecchymosis, petechiae Breasts: reconstructions bilaterally without findings of concern for local breast cancer.  Axillae benign.  Lab Results:  Results for orders placed or performed in visit on 11/07/14  CBC with Differential  Result Value Ref Range   WBC 5.8 3.9 - 10.3 10e3/uL   NEUT# 3.7 1.5 - 6.5 10e3/uL   HGB 14.4 11.6 - 15.9 g/dL   HCT 44.7 34.8 - 46.6 %   Platelets 168 145 - 400 10e3/uL   MCV 89.1 79.5 - 101.0 fL   MCH 28.7 25.1 - 34.0 pg   MCHC 32.2 31.5 - 36.0 g/dL   RBC 5.02 3.70 - 5.45 10e6/uL   RDW 13.4 11.2 - 14.5 %   lymph# 1.4 0.9 - 3.3 10e3/uL   MONO# 0.5 0.1 - 0.9 10e3/uL   Eosinophils Absolute 0.3 0.0 -  0.5 10e3/uL   Basophils Absolute 0.1 0.0 - 0.1 10e3/uL   NEUT% 62.7 38.4 - 76.8 %   LYMPH% 23.2 14.0 - 49.7 %   MONO% 8.2 0.0 - 14.0 %   EOS% 4.7 0.0 - 7.0 %   BASO% 1.2 0.0 - 2.0 %    CMET normal with exception of alk phos minimally elevated at 158, other LFTs fine  Lipid panel drawn due to aromatase inhibitor, available after visit with cholesterol 149, triglycerides 66, HDL 60,  LDL76, total choles/HDL 2.5 B12 good at 903  Vit D 56 Ferritin 34 - done due to gastric bypass, IV feraheme 03-2013  Studies/Results:  No results found.  Medications: I have reviewed the patient's current medications. We have mentioned Prolia if aromatase inhibitor is continued.  DISCUSSION: we have discussed discontinuing the aromatase inhibitor now, as the most recent breast cancer was 7 years ago, < 1 cm with 3 negative sentinel nodes and good prognostic features otherwise, as well as bilateral mastectomies and BSO; the first breast cancer was 22 years ago. In addition, she is at least osteopenic and has more vaginal dryness. Lasseigne has been reluctant to consider stopping the AI previously, but is comfortable with this plan now. I will see her back in 6 months to be sure she is doing well, then likely yearly visits. She will need bone density scan at least 09-2015, however since she is stopping the AI, I will not try to get this done now. Minimal elevation of AP does not seem worrisome, will follow up with next labs.  Assessment/Plan:  1.History of 2 primary right breast cancer as above: will DC Femara when she completes the small amount left on present prescription. I will see her back in 6 months or sooner if needed.  2.Osteopenia preceeding aromatase inhibitor: Risk factors include aromatase inhibitor, gastric bypass, lack of exercise. Bone density scan 09-2015. 4.hysterectomy with oophorectomy  5.hypothyroidism on replacement  6.post right total knee arthroplasty 11-2013 7.prophylactic left mastectomy, bilateral breast reconstructions 8.previous iron deficiency related to gastric bypass: had IV feraheme 04-06-13, ferritin a little lower but still in reasonable range, will follow. 9.gastric bypass for weight control     Danylle Ouk P, MD   11/07/2014, 1:39 PM

## 2014-11-08 LAB — LIPID PANEL
Cholesterol: 149 mg/dL (ref 0–200)
HDL: 60 mg/dL (ref >39)
LDL CALC: 76 mg/dL (ref 0–99)
Total CHOL/HDL Ratio: 2.5 Ratio
Triglycerides: 66 mg/dL (ref <150)
VLDL: 13 mg/dL (ref 0–40)

## 2014-11-08 LAB — VITAMIN B12: Vitamin B-12: 903 pg/mL (ref 211–911)

## 2014-11-08 LAB — VITAMIN D 25 HYDROXY (VIT D DEFICIENCY, FRACTURES): VIT D 25 HYDROXY: 56 ng/mL (ref 30–89)

## 2015-01-17 ENCOUNTER — Telehealth: Payer: Self-pay | Admitting: Family Medicine

## 2015-01-17 DIAGNOSIS — F900 Attention-deficit hyperactivity disorder, predominantly inattentive type: Secondary | ICD-10-CM

## 2015-01-17 MED ORDER — LISDEXAMFETAMINE DIMESYLATE 60 MG PO CAPS: 60.0000 mg | ORAL_CAPSULE | ORAL | Status: DC

## 2015-01-17 NOTE — Telephone Encounter (Signed)
Pt informed Rx ready

## 2015-01-17 NOTE — Telephone Encounter (Signed)
Vyvanse renewed

## 2015-02-14 ENCOUNTER — Telehealth: Payer: Self-pay | Admitting: Oncology

## 2015-02-14 NOTE — Telephone Encounter (Signed)
, °

## 2015-04-02 ENCOUNTER — Encounter (HOSPITAL_BASED_OUTPATIENT_CLINIC_OR_DEPARTMENT_OTHER): Payer: Self-pay | Admitting: Emergency Medicine

## 2015-04-02 ENCOUNTER — Emergency Department (HOSPITAL_BASED_OUTPATIENT_CLINIC_OR_DEPARTMENT_OTHER): Payer: 59

## 2015-04-02 ENCOUNTER — Inpatient Hospital Stay (HOSPITAL_BASED_OUTPATIENT_CLINIC_OR_DEPARTMENT_OTHER)
Admission: EM | Admit: 2015-04-02 | Discharge: 2015-04-05 | DRG: 343 | Disposition: A | Discharge status: Home Health | Payer: 59 | Attending: General Surgery | Admitting: General Surgery

## 2015-04-02 DIAGNOSIS — Z853 Personal history of malignant neoplasm of breast: Secondary | ICD-10-CM

## 2015-04-02 DIAGNOSIS — F988 Other specified behavioral and emotional disorders with onset usually occurring in childhood and adolescence: Secondary | ICD-10-CM | POA: Diagnosis present

## 2015-04-02 DIAGNOSIS — Z96651 Presence of right artificial knee joint: Secondary | ICD-10-CM | POA: Diagnosis present

## 2015-04-02 DIAGNOSIS — Z90710 Acquired absence of both cervix and uterus: Secondary | ICD-10-CM | POA: Diagnosis not present

## 2015-04-02 DIAGNOSIS — Z9884 Bariatric surgery status: Secondary | ICD-10-CM | POA: Diagnosis not present

## 2015-04-02 DIAGNOSIS — K3580 Unspecified acute appendicitis: Secondary | ICD-10-CM | POA: Diagnosis present

## 2015-04-02 DIAGNOSIS — K37 Unspecified appendicitis: Secondary | ICD-10-CM | POA: Diagnosis present

## 2015-04-02 DIAGNOSIS — E039 Hypothyroidism, unspecified: Secondary | ICD-10-CM | POA: Diagnosis present

## 2015-04-02 DIAGNOSIS — K353 Acute appendicitis with localized peritonitis, without perforation or gangrene: Secondary | ICD-10-CM

## 2015-04-02 DIAGNOSIS — Z87442 Personal history of urinary calculi: Secondary | ICD-10-CM

## 2015-04-02 DIAGNOSIS — Z9049 Acquired absence of other specified parts of digestive tract: Secondary | ICD-10-CM | POA: Diagnosis present

## 2015-04-02 DIAGNOSIS — R1031 Right lower quadrant pain: Secondary | ICD-10-CM | POA: Diagnosis not present

## 2015-04-02 LAB — URINALYSIS, ROUTINE W REFLEX MICROSCOPIC
Bilirubin Urine: NEGATIVE
Glucose, UA: NEGATIVE mg/dL
Hgb urine dipstick: NEGATIVE
KETONES UR: 15 mg/dL — AB
LEUKOCYTES UA: NEGATIVE
Nitrite: NEGATIVE
PROTEIN: NEGATIVE mg/dL
Specific Gravity, Urine: 1.028 (ref 1.005–1.030)
Urobilinogen, UA: 1 mg/dL (ref 0.0–1.0)
pH: 6 (ref 5.0–8.0)

## 2015-04-02 LAB — CBC WITH DIFFERENTIAL/PLATELET
BASOS ABS: 0 10*3/uL (ref 0.0–0.1)
BASOS PCT: 0 % (ref 0–1)
Eosinophils Absolute: 0.1 10*3/uL (ref 0.0–0.7)
Eosinophils Relative: 0 % (ref 0–5)
HCT: 43.2 % (ref 36.0–46.0)
Hemoglobin: 14.2 g/dL (ref 12.0–15.0)
LYMPHS PCT: 7 % — AB (ref 12–46)
Lymphs Abs: 1.1 10*3/uL (ref 0.7–4.0)
MCH: 29 pg (ref 26.0–34.0)
MCHC: 32.9 g/dL (ref 30.0–36.0)
MCV: 88.3 fL (ref 78.0–100.0)
MONO ABS: 1.4 10*3/uL — AB (ref 0.1–1.0)
Monocytes Relative: 9 % (ref 3–12)
NEUTROS ABS: 12.3 10*3/uL — AB (ref 1.7–7.7)
NEUTROS PCT: 84 % — AB (ref 43–77)
PLATELETS: 184 10*3/uL (ref 150–400)
RBC: 4.89 MIL/uL (ref 3.87–5.11)
RDW: 13.2 % (ref 11.5–15.5)
WBC: 14.8 10*3/uL — AB (ref 4.0–10.5)

## 2015-04-02 LAB — BASIC METABOLIC PANEL
ANION GAP: 9 (ref 5–15)
BUN: 6 mg/dL (ref 6–23)
CALCIUM: 8.9 mg/dL (ref 8.4–10.5)
CO2: 28 mmol/L (ref 19–32)
Chloride: 98 mmol/L (ref 96–112)
Creatinine, Ser: 0.52 mg/dL (ref 0.50–1.10)
GFR calc non Af Amer: >90 mL/min (ref >90)
Glucose, Bld: 133 mg/dL — ABNORMAL HIGH (ref 70–99)
POTASSIUM: 4 mmol/L (ref 3.5–5.1)
Sodium: 135 mmol/L (ref 135–145)

## 2015-04-02 MED ORDER — SODIUM CHLORIDE 0.9 % IV BOLUS (SEPSIS)
1000.0000 mL | Freq: Once | INTRAVENOUS | Status: AC
  Administered 2015-04-02: 1000 mL via INTRAVENOUS

## 2015-04-02 MED ORDER — ONDANSETRON HCL 4 MG/2ML IJ SOLN: 4.0000 mg | INTRAMUSCULAR | Status: DC | PRN

## 2015-04-02 MED ORDER — CEFTRIAXONE SODIUM 2 G IJ SOLR
INTRAMUSCULAR | Status: AC
  Filled 2015-04-02: qty 2

## 2015-04-02 MED ORDER — KCL IN DEXTROSE-NACL 20-5-0.9 MEQ/L-%-% IV SOLN
INTRAVENOUS | Status: DC
  Administered 2015-04-03 – 2015-04-04 (×3): via INTRAVENOUS
  Filled 2015-04-02 (×5): qty 1000

## 2015-04-02 MED ORDER — MORPHINE SULFATE 4 MG/ML IJ SOLN
4.0000 mg | Freq: Once | INTRAMUSCULAR | Status: AC
  Administered 2015-04-02: 4 mg via INTRAVENOUS
  Filled 2015-04-02: qty 1

## 2015-04-02 MED ORDER — METRONIDAZOLE IN NACL 5-0.79 MG/ML-% IV SOLN
500.0000 mg | Freq: Three times a day (TID) | INTRAVENOUS | Status: DC
  Administered 2015-04-03 – 2015-04-05 (×8): 500 mg via INTRAVENOUS
  Filled 2015-04-02 (×9): qty 100

## 2015-04-02 MED ORDER — IOHEXOL 300 MG/ML  SOLN
25.0000 mL | Freq: Once | INTRAMUSCULAR | Status: AC | PRN
  Administered 2015-04-02: 25 mL via ORAL

## 2015-04-02 MED ORDER — IOHEXOL 300 MG/ML  SOLN
100.0000 mL | Freq: Once | INTRAMUSCULAR | Status: AC | PRN
  Administered 2015-04-02: 100 mL via INTRAVENOUS

## 2015-04-02 MED ORDER — MORPHINE SULFATE 2 MG/ML IJ SOLN
2.0000 mg | INTRAMUSCULAR | Status: DC | PRN
  Administered 2015-04-03 – 2015-04-04 (×2): 6 mg via INTRAVENOUS
  Filled 2015-04-02 (×2): qty 3

## 2015-04-02 MED ORDER — DEXTROSE 5 % IV SOLN
2.0000 g | Freq: Once | INTRAVENOUS | Status: AC
  Administered 2015-04-02: 2 g via INTRAVENOUS

## 2015-04-02 MED ORDER — IBUPROFEN 800 MG PO TABS
800.0000 mg | ORAL_TABLET | Freq: Once | ORAL | Status: AC
  Administered 2015-04-02: 800 mg via ORAL
  Filled 2015-04-02: qty 1

## 2015-04-02 MED ORDER — CEFTRIAXONE SODIUM IN DEXTROSE 20 MG/ML IV SOLN
1.0000 g | INTRAVENOUS | Status: DC
  Administered 2015-04-03 – 2015-04-04 (×3): 1 g via INTRAVENOUS
  Filled 2015-04-02 (×3): qty 50

## 2015-04-02 MED ORDER — ONDANSETRON HCL 4 MG/2ML IJ SOLN
4.0000 mg | Freq: Once | INTRAMUSCULAR | Status: AC
  Administered 2015-04-02: 4 mg via INTRAVENOUS
  Filled 2015-04-02: qty 2

## 2015-04-02 NOTE — ED Notes (Signed)
RLQ pain since yesterday. Nausea.  Denies vomiting or diarrhea.  Has taken Zofran.  Pain increased with walking and "when I put my heel down."  Pt is a PA-C.

## 2015-04-02 NOTE — ED Provider Notes (Signed)
CSN: [id number]     Arrival date & time 04/02/15  1747 History  This chart was scribed for Malvin Johns, MD by Tula Nakayama, ED Scribe. This patient was seen in room MH05/MH05 and the patient's care was started at 6:51 PM.    Chief Complaint  Patient presents with  . Abdominal Pain   The history is provided by the patient. No language interpreter was used.    HPI Comments: Christy [name] is a 59 y.o. female with a history of cholecystectomy, hysterectomy, gastric bypass and kidney stones who presents to the Emergency Department complaining of constant, moderate RLQ pain that started yesterday although she hasn't been feeling right for a few days.  She states increased flatulence, diarrhea, decreased appetite, nausea and fever as associated symptoms. Her pain becomes worse with walking. Pt has tried Gas-X and Zofran with no relief. She denies dysuria, hematochezia, cough and congestion as associated symptoms.  Past Medical History  Diagnosis Date  . Breast cancer   . Allergy     rhinitis  . Anemia   . ADD (attention deficit disorder)   . Obesity   . Renal calculi   . Hypothyroid   . Internal hemorrhoids   . Tubular adenoma of colon    Past Surgical History  Procedure Laterality Date  . Mastectomy Bilateral     with lymph node dissection  . Cesarean section      x 2  . Vaginal hysterectomy    . Gastric bypass    . Arthroscopic knee Right     x 3  . Lithotripsy    . Cholecystectomy    . Breast lumpectomy Right   . Total knee arthroplasty Right 12/18/2013    Procedure: TOTAL KNEE ARTHROPLASTY;  Surgeon: Kerin Salen, MD;  Location: [place];  Service: Orthopedics;  Laterality: Right;   Family History  Problem Relation Age of Onset  . COPD Father   . Breast cancer Paternal Aunt   . Colon polyps Mother   . Colon cancer Neg Hx   . Esophageal cancer Neg Hx   . Rectal cancer Neg Hx   . Stomach cancer Neg Hx    History  Substance Use Topics  . Smoking status: Never Smoker    . Smokeless tobacco: Never Used  . Alcohol Use: Yes     Comment: occasional   OB History    No data available     Review of Systems  Constitutional: Positive for fever and appetite change. Negative for chills, diaphoresis and fatigue.  HENT: Negative for congestion, rhinorrhea and sneezing.   Eyes: Negative.   Respiratory: Negative for cough, chest tightness and shortness of breath.   Cardiovascular: Negative for chest pain and leg swelling.  Gastrointestinal: Positive for nausea, abdominal pain and diarrhea. Negative for vomiting and blood in stool.  Genitourinary: Negative for dysuria, frequency, hematuria, flank pain and difficulty urinating.  Musculoskeletal: Negative for back pain and arthralgias.  Skin: Negative for rash.  Neurological: Negative for dizziness, speech difficulty, weakness, numbness and headaches.   Allergies  Review of patient's allergies indicates no known allergies.  Home Medications   Prior to Admission medications   Medication Sig Start Date End Date Taking? Authorizing Provider  B Complex-C (B-COMPLEX WITH VITAMIN C) tablet Take 1 tablet by mouth daily.   Yes Historical Provider, MD  Cholecalciferol (VITAMIN D) 2000 UNITS tablet Take 200-5,000 Units by mouth daily.    Yes Historical Provider, MD  levothyroxine (SYNTHROID, LEVOTHROID) 50 MCG tablet  TAKE 1 TABLET BY MOUTH ONCE DAILY   Yes Denita Lung, MD  lisdexamfetamine (VYVANSE) 60 MG capsule Take 1 capsule (60 mg total) by mouth every morning. 01/17/15  Yes Denita Lung, MD  MAGNESIUM OXIDE PO Take 1 tablet by mouth daily.   Yes Historical Provider, MD  Multiple Vitamin (MULTIVITAMIN) capsule Take 3 capsules by mouth daily.    Yes Historical Provider, MD  ranitidine (ZANTAC) 300 MG tablet TAKE 1 TABLET BY MOUTH AT BEDTIME. Patient taking differently: TAKE 1 TABLET BY MOUTH DAILY AS NEEDED FOR ACID REFLUX 09/07/14  Yes Lafayette Dragon, MD  valACYclovir (VALTREX) 500 MG tablet Take 2 tablets (1,000 mg  total) by mouth daily as needed. Patient taking differently: Take 1,000 mg by mouth daily as needed (for cold sore.).  11/07/14  Yes Lennis Marion Downer, MD  venlafaxine XR (EFFEXOR-XR) 150 MG 24 hr capsule Take 1 capsule (150 mg total) by mouth daily with breakfast. 03/20/14  Yes Denita Lung, MD  cyclobenzaprine (FLEXERIL) 10 MG tablet Take 1 tablet (10 mg total) by mouth 3 (three) times daily as needed for muscle spasms. Patient not taking: Reported on 04/02/2015 11/07/14   Gordy Levan, MD  letrozole (FEMARA) 2.5 MG tablet TAKE 1 TABLET BY MOUTH ONCE DAILY Patient not taking: Reported on 04/02/2015    Lennis P Livesay, MD   BP 133/70 mmHg  Pulse 107  Temp(Src) 98.3 F (36.8 C) (Oral)  Resp 20  Ht 5' 6.5" (1.689 m)  Wt 184 lb 15.5 oz (83.9 kg)  BMI 29.41 kg/m2  SpO2 95% Physical Exam  Constitutional: She is oriented to person, place, and time. She appears well-developed and well-nourished.  HENT:  Head: Normocephalic and atraumatic.  Eyes: Pupils are equal, round, and reactive to light.  Neck: Normal range of motion. Neck supple.  Cardiovascular: Normal rate, regular rhythm and normal heart sounds.   Pulmonary/Chest: Effort normal and breath sounds normal. No respiratory distress. She has no wheezes. She has no rales. She exhibits no tenderness.  Abdominal: Soft. Bowel sounds are decreased. There is tenderness (Moderate tenderness to the right lower quadrant with voluntary guarding). There is tenderness at McBurney's point. There is no rebound and no guarding.  Musculoskeletal: Normal range of motion. She exhibits no edema.  Lymphadenopathy:    She has no cervical adenopathy.  Neurological: She is alert and oriented to person, place, and time.  Skin: Skin is warm and dry. No rash noted.  Psychiatric: She has a normal mood and affect.    ED Course  Procedures   DIAGNOSTIC STUDIES: Oxygen Saturation is 100% on RA, normal by my interpretation.    COORDINATION OF CARE: 6:57 PM  Discussed CBC results and treatment plan with pt which includes CT Abdomen Pelvis. Pt agreed to plan.   Labs Review Labs Reviewed  URINALYSIS, ROUTINE W REFLEX MICROSCOPIC - Abnormal; Notable for the following:    Color, Urine AMBER (*)    Ketones, ur 15 (*)    All other components within normal limits  BASIC METABOLIC PANEL - Abnormal; Notable for the following:    Glucose, Bld 133 (*)    All other components within normal limits  CBC WITH DIFFERENTIAL/PLATELET - Abnormal; Notable for the following:    WBC 14.8 (*)    Neutrophils Relative % 84 (*)    Neutro Abs 12.3 (*)    Lymphocytes Relative 7 (*)    Monocytes Absolute 1.4 (*)    All other components within normal  limits  SURGICAL PCR SCREEN    Imaging Review Ct Abdomen Pelvis W Contrast  04/02/2015   CLINICAL DATA:  Right lower quadrant abdominal pain since yesterday. Nausea. Fever. Leukocytosis. Previous bilateral mastectomy in 2008 for breast cancer. Previous cholecystectomy, hysterectomy and gastric bypass.  EXAM: CT ABDOMEN AND PELVIS WITH CONTRAST  TECHNIQUE: Multidetector CT imaging of the abdomen and pelvis was performed using the standard protocol following bolus administration of intravenous contrast.  CONTRAST:  34mL OMNIPAQUE IOHEXOL 300 MG/ML SOLN, 113mL OMNIPAQUE IOHEXOL 300 MG/ML SOLN  COMPARISON:  01/27/2010.  FINDINGS: Bilateral breast reconstructions with implants are again demonstrated. Mild diffuse low density of the liver relative to the spleen. Stable diffuse intrahepatic and extrahepatic biliary ductal dilatation. Cholecystectomy clips. Somewhat small pancreas with little change.  Diffusely enlarged appendix with extensive periappendiceal soft tissue stranding. The appendix measures 12.7 mm in diameter. The appendix is located in the upper right pelvis. There are mildly prominent adjacent right lower quadrant mesenteric lymph nodes. No fluid collections or free peritoneal air are seen.  Small hiatal hernia. Post  gastric bypass changes. Unremarkable mid colon and small bowel with small sigmoid colon diverticula noted. Unremarkable spleen, adrenal glands, kidneys and urinary bladder. Surgically absent uterus. No adnexal masses.  Clear lung bases.  Unremarkable bones.  IMPRESSION: 1. Marked acute appendicitis without abscess and with mild associated reactive adenopathy. 2. Mild diffuse hepatic steatosis. 3. Chronic post cholecystectomy biliary ductal dilatation. 4. Small sliding hiatal hernia. These results will be called to the ordering clinician or representative by the Radiologist Assistant, and communication documented in the PACS or zVision Dashboard.   Electronically Signed   By: Claudie Revering M.D.   On: 04/02/2015 20:09     EKG Interpretation None      MDM   Final diagnoses:  Acute appendicitis with localized peritonitis   Patient has evidence of appendicitis on CT. She also is febrile in the ED. I spoke with Dr. Zella Richer  who is accepted patient for transfer to [facility]. Per his request she was given a dose of Rocephin in the ED for antibiotic coverage. Of note, patient was inadvertently given a dose of ibuprofen as a protocol order from the nurse for her fever.    I personally performed the services described in this documentation, which was scribed in my presence.  The recorded information has been reviewed and considered.    Malvin Johns, MD 04/02/15 2350

## 2015-04-02 NOTE — ED Notes (Signed)
Report given to [facility] on [place] at [employer].

## 2015-04-02 NOTE — H&P (Signed)
Christy [name] is an 59 y.o. female.   Chief Complaint: Right lower quadrant abdominal pain HPI: She awoke yesterday morning not feeling well. She had some abdominal bloating and a downward urge. In the later part of the day, she began having some right lower quadrant discomfort with some nausea. She had a poor appetite. 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 At the day progressed the pain worsened. She had some chills. She had some diarrhea. She subsequently went to [facility] in [facility]. She was noted to have right lower quadrant tenderness as well as a leukocytosis. A CT scan was performed which was consistent with acute appendicitis. She was transferred here for further treatment. She received Rocephin while at the [facility] in [facility]  Past Medical History  Diagnosis Date  . Breast cancer   . Allergy     rhinitis  . Anemia   . ADD (attention deficit disorder)   . Obesity   . Renal calculi   . Hypothyroid   . Internal hemorrhoids   . Tubular adenoma of colon     Past Surgical History  Procedure Laterality Date  . Mastectomy-bilateral Bilateral     with lymph node dissection  . Cesarean section      x 2  . Vaginal hysterectomy    . Gastric bypass    . Arthroscopic knee Right     x 3  . Lithotripsy    . Cholecystectomy    . Breast lumpectomy Right   . Total knee arthroplasty Right 12/18/2013    Procedure: TOTAL KNEE ARTHROPLASTY;  Surgeon: Kerin Salen, MD;  Location: [facility];  Service: Orthopedics;  Laterality: Right;    Breast reconstruction  Family History  Problem Relation Age of Onset  . COPD Father   . Breast cancer Paternal Aunt   . Colon polyps Mother   . Colon cancer Neg Hx   . Esophageal cancer Neg Hx   . Rectal cancer Neg Hx   . Stomach cancer Neg Hx    Social History:  reports that she has never smoked. She has never used smokeless tobacco. She reports that she drinks alcohol. She reports that she does not use  illicit drugs.  Allergies: No Known Allergies  Medications Prior to Admission  Medication Sig Dispense Refill  . B Complex-C (B-COMPLEX WITH VITAMIN C) tablet Take 1 tablet by mouth daily.    . Cholecalciferol (VITAMIN D) 2000 UNITS tablet Take 200-5,000 Units by mouth daily.     Marland Kitchen levothyroxine (SYNTHROID, LEVOTHROID) 50 MCG tablet TAKE 1 TABLET BY MOUTH ONCE DAILY 90 tablet 3  . lisdexamfetamine (VYVANSE) 60 MG capsule Take 1 capsule (60 mg total) by mouth every morning. 90 capsule 0  . MAGNESIUM OXIDE PO Take 1 tablet by mouth daily.    . Multiple Vitamin (MULTIVITAMIN) capsule Take 3 capsules by mouth daily.     . ranitidine (ZANTAC) 300 MG tablet TAKE 1 TABLET BY MOUTH AT BEDTIME. (Patient taking differently: TAKE 1 TABLET BY MOUTH DAILY AS NEEDED FOR ACID REFLUX) 30 tablet 1  . valACYclovir (VALTREX) 500 MG tablet Take 2 tablets (1,000 mg total) by mouth daily as needed. (Patient taking differently: Take 1,000 mg by mouth daily as needed (for cold sore.). ) 30 tablet 1  . venlafaxine XR (EFFEXOR-XR) 150 MG 24 hr capsule Take 1 capsule (150 mg total) by mouth daily with breakfast. 90 capsule 3  . cyclobenzaprine (FLEXERIL) 10  MG tablet Take 1 tablet (10 mg total) by mouth 3 (three) times daily as needed for muscle spasms. (Patient not taking: Reported on 04/02/2015) 30 tablet 0  . letrozole (Leslie) 2.5 MG tablet TAKE 1 TABLET BY MOUTH ONCE DAILY (Patient not taking: Reported on 04/02/2015) 30 tablet 5    Results for orders placed or performed during the hospital encounter of 04/02/15 (from the past 48 hour(s))  Urinalysis, Routine w reflex microscopic     Status: Abnormal   Collection Time: 04/02/15  6:00 PM  Result Value Ref Range   Color, Urine AMBER (A) YELLOW    Comment: BIOCHEMICALS MAY BE AFFECTED BY COLOR   APPearance CLEAR CLEAR   Specific Gravity, Urine 1.028 1.005 - 1.030   pH 6.0 5.0 - 8.0   Glucose, UA NEGATIVE NEGATIVE mg/dL   Hgb urine dipstick NEGATIVE NEGATIVE    Bilirubin Urine NEGATIVE NEGATIVE   Ketones, ur 15 (A) NEGATIVE mg/dL   Protein, ur NEGATIVE NEGATIVE mg/dL   Urobilinogen, UA 1.0 0.0 - 1.0 mg/dL   Nitrite NEGATIVE NEGATIVE   Leukocytes, UA NEGATIVE NEGATIVE    Comment: MICROSCOPIC NOT DONE ON URINES WITH NEGATIVE PROTEIN, BLOOD, LEUKOCYTES, NITRITE, OR GLUCOSE <1000 mg/dL.  Basic metabolic panel     Status: Abnormal   Collection Time: 04/02/15  6:30 PM  Result Value Ref Range   Sodium 135 135 - 145 mmol/L   Potassium 4.0 3.5 - 5.1 mmol/L   Chloride 98 96 - 112 mmol/L   CO2 28 19 - 32 mmol/L   Glucose, Bld 133 (H) 70 - 99 mg/dL   BUN 6 6 - 23 mg/dL   Creatinine, Ser 0.52 0.50 - 1.10 mg/dL   Calcium 8.9 8.4 - 10.5 mg/dL   GFR calc non Af Amer >90 >90 mL/min   GFR calc Af Amer >90 >90 mL/min    Comment: (NOTE) The eGFR has been calculated using the CKD EPI equation. This calculation has not been validated in all clinical situations. eGFR's persistently <90 mL/min signify possible Chronic Kidney Disease.    Anion gap 9 5 - 15  CBC with Differential     Status: Abnormal   Collection Time: 04/02/15  6:30 PM  Result Value Ref Range   WBC 14.8 (H) 4.0 - 10.5 K/uL   RBC 4.89 3.87 - 5.11 MIL/uL   Hemoglobin 14.2 12.0 - 15.0 g/dL   HCT 43.2 36.0 - 46.0 %   MCV 88.3 78.0 - 100.0 fL   MCH 29.0 26.0 - 34.0 pg   MCHC 32.9 30.0 - 36.0 g/dL   RDW 13.2 11.5 - 15.5 %   Platelets 184 150 - 400 K/uL   Neutrophils Relative % 84 (H) 43 - 77 %   Neutro Abs 12.3 (H) 1.7 - 7.7 K/uL   Lymphocytes Relative 7 (L) 12 - 46 %   Lymphs Abs 1.1 0.7 - 4.0 K/uL   Monocytes Relative 9 3 - 12 %   Monocytes Absolute 1.4 (H) 0.1 - 1.0 K/uL   Eosinophils Relative 0 0 - 5 %   Eosinophils Absolute 0.1 0.0 - 0.7 K/uL   Basophils Relative 0 0 - 1 %   Basophils Absolute 0.0 0.0 - 0.1 K/uL   Ct Abdomen Pelvis W Contrast  04/02/2015   CLINICAL DATA:  Right lower quadrant abdominal pain since yesterday. Nausea. Fever. Leukocytosis. Previous bilateral mastectomy  in 2008 for breast cancer. Previous cholecystectomy, hysterectomy and gastric bypass.  EXAM: CT ABDOMEN AND PELVIS WITH CONTRAST  TECHNIQUE: Multidetector CT imaging of the abdomen and pelvis was performed using the standard protocol following bolus administration of intravenous contrast.  CONTRAST:  64m OMNIPAQUE IOHEXOL 300 MG/ML SOLN, 1037mOMNIPAQUE IOHEXOL 300 MG/ML SOLN  COMPARISON:  01/27/2010.  FINDINGS: Bilateral breast reconstructions with implants are again demonstrated. Mild diffuse low density of the liver relative to the spleen. Stable diffuse intrahepatic and extrahepatic biliary ductal dilatation. Cholecystectomy clips. Somewhat small pancreas with little change.  Diffusely enlarged appendix with extensive periappendiceal soft tissue stranding. The appendix measures 12.7 mm in diameter. The appendix is located in the upper right pelvis. There are mildly prominent adjacent right lower quadrant mesenteric lymph nodes. No fluid collections or free peritoneal air are seen.  Small hiatal hernia. Post gastric bypass changes. Unremarkable mid colon and small bowel with small sigmoid colon diverticula noted. Unremarkable spleen, adrenal glands, kidneys and urinary bladder. Surgically absent uterus. No adnexal masses.  Clear lung bases.  Unremarkable bones.  IMPRESSION: 1. Marked acute appendicitis without abscess and with mild associated reactive adenopathy. 2. Mild diffuse hepatic steatosis. 3. Chronic post cholecystectomy biliary ductal dilatation. 4. Small sliding hiatal hernia. These results will be called to the ordering clinician or representative by the Radiologist Assistant, and communication documented in the PACS or zVision Dashboard.   Electronically Signed   By: StClaudie Revering.D.   On: 04/02/2015 20:09    Review of Systems  Constitutional: Positive for fever, chills and malaise/fatigue.  Gastrointestinal: Positive for nausea, abdominal pain and diarrhea.  Genitourinary: Negative for  dysuria.    Blood pressure 133/70, pulse 107, temperature 98.3 F (36.8 C), temperature source Oral, resp. rate 20, height 5' 6.5" (1.689 m), weight 83.915 kg (185 lb), SpO2 95 %. Physical Exam  Constitutional: No distress.  Overweight female.  HENT:  Head: Normocephalic and atraumatic.  Eyes: No scleral icterus.  Cardiovascular: Normal rate and regular rhythm.   Respiratory: Effort normal and breath sounds normal.  GI: Soft. There is tenderness (RLQ). Guarding: RLQ.  Multiple scars.  Musculoskeletal: She exhibits no edema.  Neurological: She is alert.  Skin: Skin is warm and dry.  Psychiatric: She has a normal mood and affect. Her behavior is normal.     Assessment/Plan Acute appendicitis, no evidence of perforation  Plan:  Admit, IV abxs, laparoscopic possible open appendectomy in AM.  I have discussed the procedure and risks of appendectomy. The risks include but are not limited to bleeding, infection, wound problems, anesthesia, injury to intra-abdominal organs, possibility of postoperative ileus. She seems to understand and agrees with the plan.  Laterria Lasota J 04/02/2015, 11:10 PM

## 2015-04-03 ENCOUNTER — Inpatient Hospital Stay (HOSPITAL_COMMUNITY): Payer: 59 | Admitting: Anesthesiology

## 2015-04-03 ENCOUNTER — Encounter (HOSPITAL_COMMUNITY): Admission: EM | Disposition: A | Discharge status: Home Health | Payer: Self-pay | Source: Home / Self Care

## 2015-04-03 HISTORY — PX: LAPAROSCOPIC APPENDECTOMY: SHX408

## 2015-04-03 LAB — SURGICAL PCR SCREEN
MRSA, PCR: NEGATIVE
Staphylococcus aureus: NEGATIVE

## 2015-04-03 SURGERY — APPENDECTOMY, LAPAROSCOPIC
Anesthesia: General | Site: Abdomen

## 2015-04-03 MED ORDER — FENTANYL CITRATE 0.05 MG/ML IJ SOLN
INTRAMUSCULAR | Status: AC
  Filled 2015-04-03: qty 2

## 2015-04-03 MED ORDER — GLYCOPYRROLATE 0.2 MG/ML IJ SOLN
INTRAMUSCULAR | Status: DC | PRN
  Administered 2015-04-03: 0.6 mg via INTRAVENOUS

## 2015-04-03 MED ORDER — PROMETHAZINE HCL 25 MG/ML IJ SOLN: 6.2500 mg | INTRAMUSCULAR | Status: DC | PRN

## 2015-04-03 MED ORDER — MIDAZOLAM HCL 2 MG/2ML IJ SOLN
INTRAMUSCULAR | Status: AC
  Filled 2015-04-03: qty 2

## 2015-04-03 MED ORDER — CISATRACURIUM BESYLATE (PF) 10 MG/5ML IV SOLN
INTRAVENOUS | Status: DC | PRN
  Administered 2015-04-03: 4 mg via INTRAVENOUS
  Administered 2015-04-03: 5 mg via INTRAVENOUS

## 2015-04-03 MED ORDER — FENTANYL CITRATE 0.05 MG/ML IJ SOLN
25.0000 ug | INTRAMUSCULAR | Status: DC | PRN
  Administered 2015-04-03: 50 ug via INTRAVENOUS

## 2015-04-03 MED ORDER — GLYCOPYRROLATE 0.2 MG/ML IJ SOLN
INTRAMUSCULAR | Status: AC
  Filled 2015-04-03: qty 3

## 2015-04-03 MED ORDER — NEOSTIGMINE METHYLSULFATE 10 MG/10ML IV SOLN
INTRAVENOUS | Status: DC | PRN
  Administered 2015-04-03: 4 mg via INTRAVENOUS

## 2015-04-03 MED ORDER — NEOSTIGMINE METHYLSULFATE 10 MG/10ML IV SOLN
INTRAVENOUS | Status: AC
  Filled 2015-04-03: qty 1

## 2015-04-03 MED ORDER — ONDANSETRON HCL 4 MG/2ML IJ SOLN
INTRAMUSCULAR | Status: DC | PRN
Start: 2015-04-03 — End: 2015-04-03
  Administered 2015-04-03: 4 mg via INTRAVENOUS

## 2015-04-03 MED ORDER — CISATRACURIUM BESYLATE 20 MG/10ML IV SOLN
INTRAVENOUS | Status: AC
  Filled 2015-04-03: qty 10

## 2015-04-03 MED ORDER — DEXAMETHASONE SODIUM PHOSPHATE 10 MG/ML IJ SOLN
INTRAMUSCULAR | Status: DC | PRN
  Administered 2015-04-03: 10 mg via INTRAVENOUS

## 2015-04-03 MED ORDER — PHENYLEPHRINE HCL 10 MG/ML IJ SOLN
INTRAMUSCULAR | Status: DC | PRN
  Administered 2015-04-03: 80 ug via INTRAVENOUS

## 2015-04-03 MED ORDER — FENTANYL CITRATE 0.05 MG/ML IJ SOLN
25.0000 ug | INTRAMUSCULAR | Status: DC | PRN
  Administered 2015-04-03 (×2): 25 ug via INTRAVENOUS
  Administered 2015-04-03 (×2): 50 ug via INTRAVENOUS

## 2015-04-03 MED ORDER — FENTANYL CITRATE 0.05 MG/ML IJ SOLN
INTRAMUSCULAR | Status: DC | PRN
  Administered 2015-04-03 (×3): 50 ug via INTRAVENOUS

## 2015-04-03 MED ORDER — LACTATED RINGERS IR SOLN
Status: DC | PRN
  Administered 2015-04-03: 1000 mL

## 2015-04-03 MED ORDER — KETOROLAC TROMETHAMINE 30 MG/ML IJ SOLN
30.0000 mg | Freq: Once | INTRAMUSCULAR | Status: AC
  Administered 2015-04-03: 30 mg via INTRAVENOUS

## 2015-04-03 MED ORDER — DEXAMETHASONE SODIUM PHOSPHATE 10 MG/ML IJ SOLN
INTRAMUSCULAR | Status: AC
  Filled 2015-04-03: qty 1

## 2015-04-03 MED ORDER — PROPOFOL 10 MG/ML IV BOLUS
INTRAVENOUS | Status: DC | PRN
  Administered 2015-04-03: 200 mg via INTRAVENOUS

## 2015-04-03 MED ORDER — PHENYLEPHRINE 40 MCG/ML (10ML) SYRINGE FOR IV PUSH (FOR BLOOD PRESSURE SUPPORT)
PREFILLED_SYRINGE | INTRAVENOUS | Status: AC
  Filled 2015-04-03: qty 10

## 2015-04-03 MED ORDER — METOCLOPRAMIDE HCL 5 MG/ML IJ SOLN
INTRAMUSCULAR | Status: DC | PRN
  Administered 2015-04-03: 10 mg via INTRAVENOUS

## 2015-04-03 MED ORDER — OXYCODONE-ACETAMINOPHEN 5-325 MG PO TABS
1.0000 | ORAL_TABLET | ORAL | Status: DC | PRN
  Administered 2015-04-04: 2 via ORAL
  Filled 2015-04-03: qty 2

## 2015-04-03 MED ORDER — METOCLOPRAMIDE HCL 5 MG/ML IJ SOLN
INTRAMUSCULAR | Status: AC
  Filled 2015-04-03: qty 2

## 2015-04-03 MED ORDER — ACETAMINOPHEN 10 MG/ML IV SOLN
1000.0000 mg | Freq: Once | INTRAVENOUS | Status: DC
  Filled 2015-04-03 (×2): qty 100

## 2015-04-03 MED ORDER — MEPERIDINE HCL 50 MG/ML IJ SOLN: 6.2500 mg | INTRAMUSCULAR | Status: DC | PRN

## 2015-04-03 MED ORDER — LACTATED RINGERS IV SOLN: INTRAVENOUS | Status: DC

## 2015-04-03 MED ORDER — CYCLOBENZAPRINE HCL 10 MG PO TABS: 10.0000 mg | ORAL_TABLET | Freq: Three times a day (TID) | ORAL | Status: DC | PRN

## 2015-04-03 MED ORDER — PROPOFOL 10 MG/ML IV BOLUS
INTRAVENOUS | Status: AC
  Filled 2015-04-03: qty 20

## 2015-04-03 MED ORDER — LETROZOLE 2.5 MG PO TABS
2.5000 mg | ORAL_TABLET | Freq: Every day | ORAL | Status: DC
  Filled 2015-04-03 (×2): qty 1

## 2015-04-03 MED ORDER — BUPIVACAINE-EPINEPHRINE (PF) 0.25% -1:200000 IJ SOLN
INTRAMUSCULAR | Status: AC
  Filled 2015-04-03: qty 30

## 2015-04-03 MED ORDER — METRONIDAZOLE IN NACL 5-0.79 MG/ML-% IV SOLN
INTRAVENOUS | Status: AC
  Filled 2015-04-03: qty 100

## 2015-04-03 MED ORDER — LACTATED RINGERS IV SOLN
INTRAVENOUS | Status: DC | PRN
  Administered 2015-04-03 (×2): via INTRAVENOUS

## 2015-04-03 MED ORDER — 0.9 % SODIUM CHLORIDE (POUR BTL) OPTIME
TOPICAL | Status: DC | PRN
  Administered 2015-04-03: 1000 mL

## 2015-04-03 MED ORDER — SUCCINYLCHOLINE CHLORIDE 20 MG/ML IJ SOLN
INTRAMUSCULAR | Status: DC | PRN
  Administered 2015-04-03: 100 mg via INTRAVENOUS

## 2015-04-03 MED ORDER — KETOROLAC TROMETHAMINE 30 MG/ML IJ SOLN
INTRAMUSCULAR | Status: AC
  Filled 2015-04-03: qty 1

## 2015-04-03 MED ORDER — MIDAZOLAM HCL 5 MG/5ML IJ SOLN
INTRAMUSCULAR | Status: DC | PRN
  Administered 2015-04-03: 2 mg via INTRAVENOUS

## 2015-04-03 MED ORDER — CEFTRIAXONE SODIUM IN DEXTROSE 20 MG/ML IV SOLN
1.0000 g | INTRAVENOUS | Status: DC
  Filled 2015-04-03: qty 50

## 2015-04-03 MED ORDER — BUPIVACAINE-EPINEPHRINE 0.25% -1:200000 IJ SOLN
INTRAMUSCULAR | Status: DC | PRN
  Administered 2015-04-03: 20 mL

## 2015-04-03 MED ORDER — ONDANSETRON HCL 4 MG/2ML IJ SOLN
INTRAMUSCULAR | Status: AC
  Filled 2015-04-03: qty 2

## 2015-04-03 SURGICAL SUPPLY — 42 items
APPLIER CLIP ROT 10 11.4 M/L (STAPLE)
APR CLP MED LRG 11.4X10 (STAPLE)
BAG SPEC RTRVL LRG 6X4 10 (ENDOMECHANICALS)
CLIP APPLIE ROT 10 11.4 M/L (STAPLE) IMPLANT
CUTTER FLEX LINEAR 45M (STAPLE) ×1 IMPLANT
DECANTER SPIKE VIAL GLASS SM (MISCELLANEOUS) ×1 IMPLANT
DEVICE TROCAR PUNCTURE CLOSURE (ENDOMECHANICALS) ×1 IMPLANT
DRAPE LAPAROSCOPIC ABDOMINAL (DRAPES) ×2 IMPLANT
DRAPE UTILITY XL STRL (DRAPES) ×2 IMPLANT
ELECT REM PT RETURN 9FT ADLT (ELECTROSURGICAL) ×2
ELECTRODE REM PT RTRN 9FT ADLT (ELECTROSURGICAL) ×1 IMPLANT
ENDOLOOP SUT PDS II  0 18 (SUTURE)
ENDOLOOP SUT PDS II 0 18 (SUTURE) IMPLANT
GLOVE BIO SURGEON STRL SZ7.5 (GLOVE) ×2 IMPLANT
GOWN STRL REUS W/ TWL XL LVL3 (GOWN DISPOSABLE) ×1 IMPLANT
GOWN STRL REUS W/TWL XL LVL3 (GOWN DISPOSABLE) ×6 IMPLANT
IV LACTATED RINGERS 1000ML (IV SOLUTION) ×2 IMPLANT
KIT BASIN OR (CUSTOM PROCEDURE TRAY) ×2 IMPLANT
LIQUID BAND (GAUZE/BANDAGES/DRESSINGS) ×2 IMPLANT
NS IRRIG 1000ML POUR BTL (IV SOLUTION) ×2 IMPLANT
PENCIL BUTTON HOLSTER BLD 10FT (ELECTRODE) ×2 IMPLANT
POUCH SPECIMEN RETRIEVAL 10MM (ENDOMECHANICALS) IMPLANT
RELOAD 45 VASCULAR/THIN (ENDOMECHANICALS) IMPLANT
RELOAD STAPLE 45 2.5 WHT GRN (ENDOMECHANICALS) IMPLANT
RELOAD STAPLE 45 3.5 BLU ETS (ENDOMECHANICALS) IMPLANT
RELOAD STAPLE 60 4.1 GRN THCK (STAPLE) IMPLANT
RELOAD STAPLE TA45 3.5 REG BLU (ENDOMECHANICALS) ×4 IMPLANT
RELOAD STAPLER GREEN 60MM (STAPLE) ×1 IMPLANT
SET IRRIG TUBING LAPAROSCOPIC (IRRIGATION / IRRIGATOR) ×2 IMPLANT
SHEARS HARMONIC ACE PLUS 36CM (ENDOMECHANICALS) ×1 IMPLANT
SOLUTION ANTI FOG 6CC (MISCELLANEOUS) ×2 IMPLANT
STAPLE ECHEON FLEX 60 POW ENDO (STAPLE) ×1 IMPLANT
STAPLER RELOAD GREEN 60MM (STAPLE) ×2
SUT MNCRL AB 4-0 PS2 18 (SUTURE) ×2 IMPLANT
SUT VICRYL 0 UR6 27IN ABS (SUTURE) ×1 IMPLANT
TOWEL OR 17X26 10 PK STRL BLUE (TOWEL DISPOSABLE) ×2 IMPLANT
TRAY FOLEY CATH 14FRSI W/METER (CATHETERS) ×2 IMPLANT
TRAY LAPAROSCOPIC (CUSTOM PROCEDURE TRAY) ×2 IMPLANT
TROCAR BLADELESS OPT 5 75 (ENDOMECHANICALS) ×2 IMPLANT
TROCAR SLEEVE XCEL 5X75 (ENDOMECHANICALS) ×3 IMPLANT
TROCAR XCEL BLUNT TIP 100MML (ENDOMECHANICALS) ×2 IMPLANT
TUBING INSUFFLATION 10FT LAP (TUBING) ×2 IMPLANT

## 2015-04-03 NOTE — Op Note (Signed)
04/02/2015 - 04/03/2015  12:31 PM  PATIENT:  Christy [name]  60 y.o. female  PRE-OPERATIVE DIAGNOSIS:  APPENDICITIS  POST-OPERATIVE DIAGNOSIS:  APPENDICITIS  PROCEDURE:  Procedure(s): APPENDECTOMY LAPAROSCOPIC (N/A)  SURGEON:  Surgeon(s) and Role:    * Jovita Kussmaul, MD - Primary  PHYSICIAN ASSISTANT:   ASSISTANTS: none   ANESTHESIA:   general  EBL:  Total I/O In: 1000 [I.V.:1000] Out: 200 [Urine:200]  BLOOD ADMINISTERED:none  DRAINS: none   LOCAL MEDICATIONS USED:  MARCAINE     SPECIMEN:  Source of Specimen:  appendix  DISPOSITION OF SPECIMEN:  PATHOLOGY  COUNTS:  YES  TOURNIQUET:  * No tourniquets in log *  DICTATION: .Dragon Dictation  After informed consent was obtained the patient was brought to the operating room and placed in the supine position on the operating room table. After adequate induction of general anesthesia the patient's abdomen was prepped with ChloraPrep, allowed to dry, and draped in usual sterile manner. A site was chosen in the left upper quadrant for accessing the abdominal cavity. This area was infiltrated with 1% Marcaine. A small stab incision was made with a 15 blade knife. A 5 mm Optiview port was used to dissect bluntly through the layers of the abdominal wall under direct vision until access was gained to the abdominal cavity. The abdomen was insufflated with carbon dioxide without difficulty. The abdomen was inspected and there were no adhesions. 2 sites were chosen for 5 mm ports on the left mid and inferior abdomen. These areas were infiltrated with quarter percent Marcaine and small stab incisions were made with a 15 blade knife. 5 mm ports were placed through these incisions bluntly under direct vision into the abdominal cavity without difficulty. The 5 mm port in the left upper quadrant was then exchanged for a 12 mm port under direct vision. The right lower quadrant was examined in a inflamed appendix was identified. It was initially  dissected free from the pelvic side wall by blunt dissection. The mesoappendix was then taken down sharply with the Harmonic scalpel until the base of the appendix where it joined the cecum was identified. Because of the amount of inflammation at the base of the appendix we used an echelon green load stapler across the base of the cecum where the appendix joined. Once the stapler had been fired and the appendix was removed the appendix was placed in a laparoscopic bag and the bag was sealed. The abdomen was irrigated with copious amounts of saline. The abdomen was generally inspected and no other abnormalities were noted. The appendix and bag were removed, with the 12 mm port in the left upper quadrant. The fascia at this port site was closed with a figure-of-eight 0 Vicryl stitch on a suture passer. The rest of the ports were removed under direct vision and the gas was allowed to escape. The incisions were then closed with interrupted 4-0 Monocryl subcuticular stitches. Dermabond dressings were applied. The patient tolerated the procedure well. At the end of the case all needle sponge and instrument counts were correct. The patient was then awakened and taken to recovery in stable condition.  PLAN OF CARE: Admit to inpatient   PATIENT DISPOSITION:  PACU - hemodynamically stable.   Delay start of Pharmacological VTE agent (>24hrs) due to surgical blood loss or risk of bleeding: no

## 2015-04-03 NOTE — Progress Notes (Signed)
Day of Surgery  Subjective: Complains of RLQ pain  Objective: Vital signs in last 24 hours: Temp:  [98.3 F (36.8 C)-101.5 F (38.6 C)] 98.3 F (36.8 C) (04/05 2229) Pulse Rate:  [89-116] 89 (04/06 0530) Resp:  [16-20] 18 (04/06 0530) BP: (102-162)/(57-109) 102/57 mmHg (04/06 0530) SpO2:  [95 %-100 %] 100 % (04/06 0530) Weight:  [83.9 kg (184 lb 15.5 oz)-83.915 kg (185 lb)] 83.9 kg (184 lb 15.5 oz) (04/05 2105)    Intake/Output from previous day: 04/05 0701 - 04/06 0700 In: -  Out: 1000 [Urine:1000] Intake/Output this shift:    Resp: clear to auscultation bilaterally Cardio: regular rate and rhythm GI: tender RLQ. no peritonitis  Lab Results:   Recent Labs  04/02/15 1830  WBC 14.8*  HGB 14.2  HCT 43.2  PLT 184   BMET  Recent Labs  04/02/15 1830  NA 135  K 4.0  CL 98  CO2 28  GLUCOSE 133*  BUN 6  CREATININE 0.52  CALCIUM 8.9   PT/INR No results for input(s): LABPROT, INR in the last 72 hours. ABG No results for input(s): PHART, HCO3 in the last 72 hours.  Invalid input(s): PCO2, PO2  Studies/Results: Ct Abdomen Pelvis W Contrast  04/02/2015   CLINICAL DATA:  Right lower quadrant abdominal pain since yesterday. Nausea. Fever. Leukocytosis. Previous bilateral mastectomy in 2008 for breast cancer. Previous cholecystectomy, hysterectomy and gastric bypass.  EXAM: CT ABDOMEN AND PELVIS WITH CONTRAST  TECHNIQUE: Multidetector CT imaging of the abdomen and pelvis was performed using the standard protocol following bolus administration of intravenous contrast.  CONTRAST:  54mL OMNIPAQUE IOHEXOL 300 MG/ML SOLN, 11mL OMNIPAQUE IOHEXOL 300 MG/ML SOLN  COMPARISON:  01/27/2010.  FINDINGS: Bilateral breast reconstructions with implants are again demonstrated. Mild diffuse low density of the liver relative to the spleen. Stable diffuse intrahepatic and extrahepatic biliary ductal dilatation. Cholecystectomy clips. Somewhat small pancreas with little change.  Diffusely  enlarged appendix with extensive periappendiceal soft tissue stranding. The appendix measures 12.7 mm in diameter. The appendix is located in the upper right pelvis. There are mildly prominent adjacent right lower quadrant mesenteric lymph nodes. No fluid collections or free peritoneal air are seen.  Small hiatal hernia. Post gastric bypass changes. Unremarkable mid colon and small bowel with small sigmoid colon diverticula noted. Unremarkable spleen, adrenal glands, kidneys and urinary bladder. Surgically absent uterus. No adnexal masses.  Clear lung bases.  Unremarkable bones.  IMPRESSION: 1. Marked acute appendicitis without abscess and with mild associated reactive adenopathy. 2. Mild diffuse hepatic steatosis. 3. Chronic post cholecystectomy biliary ductal dilatation. 4. Small sliding hiatal hernia. These results will be called to the ordering clinician or representative by the Radiologist Assistant, and communication documented in the PACS or zVision Dashboard.   Electronically Signed   By: Claudie Revering M.D.   On: 04/02/2015 20:09    Anti-infectives: Anti-infectives    Start     Dose/Rate Route Frequency Ordered Stop   04/03/15 2000  cefTRIAXone (ROCEPHIN) 1 g in dextrose 5 % 50 mL IVPB - Premix     1 g 100 mL/hr over 30 Minutes Intravenous Every 24 hours 04/02/15 2340     04/02/15 2345  metroNIDAZOLE (FLAGYL) IVPB 500 mg     500 mg 100 mL/hr over 60 Minutes Intravenous 3 times per day 04/02/15 2340     04/02/15 2048  cefTRIAXone (ROCEPHIN) 2 G injection    Comments:  Nilda Calamity   : cabinet override      04/02/15  2048 04/02/15 2055   04/02/15 2045  cefTRIAXone (ROCEPHIN) 2 g in dextrose 5 % 50 mL IVPB     2 g 100 mL/hr over 30 Minutes Intravenous  Once 04/02/15 2043 04/02/15 2124      Assessment/Plan: s/p Procedure(s): APPENDECTOMY LAPAROSCOPIC (N/A) plan for lap appy today. Risks and benefits of surgery as well as some of the technical aspects discussed with pt and she understands  and wishes to proceed  LOS: 1 day    TOTH III,PAUL S 04/03/2015

## 2015-04-03 NOTE — Anesthesia Postprocedure Evaluation (Signed)
  Anesthesia Post-op Note  Patient: Christy [name]  Procedure(s) Performed: Procedure(s) (LRB): APPENDECTOMY LAPAROSCOPIC (N/A)  Patient Location: PACU  Anesthesia Type: General  Level of Consciousness: awake and alert   Airway and Oxygen Therapy: Patient Spontanous Breathing  Post-op Pain: mild  Post-op Assessment: Post-op Vital signs reviewed, Patient's Cardiovascular Status Stable, Respiratory Function Stable, Patent Airway and No signs of Nausea or vomiting  Last Vitals:  Filed Vitals:   04/03/15 1424  BP: 118/65  Pulse: 106  Temp: 36.7 C  Resp: 15    Post-op Vital Signs: stable   Complications: No apparent anesthesia complications

## 2015-04-03 NOTE — Plan of Care (Signed)
Problem: Phase I Progression Outcomes Goal: Sutures/staples intact Outcome: Not Applicable Date Met:  47/09/62 Patient does not have sutures or staples

## 2015-04-03 NOTE — Progress Notes (Signed)
Patient arrived to unit at about 1425. Alert, oriented and verbal. Lap sites observed intact, clean and dry.

## 2015-04-03 NOTE — Anesthesia Procedure Notes (Signed)
Procedure Name: Intubation Date/Time: 04/03/2015 11:18 AM Performed by: Johnathan Hausen A Pre-anesthesia Checklist: Patient identified, Emergency Drugs available, Suction available, Patient being monitored and Timeout performed Patient Re-evaluated:Patient Re-evaluated prior to inductionOxygen Delivery Method: Circle system utilized Preoxygenation: Pre-oxygenation with 100% oxygen Intubation Type: IV induction Laryngoscope Size: Mac and 4 Grade View: Grade I Tube type: Oral Tube size: 7.5 mm Number of attempts: 1 Airway Equipment and Method: Stylet Placement Confirmation: ETT inserted through vocal cords under direct vision,  positive ETCO2 and breath sounds checked- equal and bilateral Secured at: 21 cm Tube secured with: Tape Dental Injury: Teeth and Oropharynx as per pre-operative assessment

## 2015-04-03 NOTE — Anesthesia Preprocedure Evaluation (Signed)
Anesthesia Evaluation  Patient identified by MRN, date of birth, ID band Patient awake    Reviewed: Allergy & Precautions, NPO status , Patient's Chart, lab work & pertinent test results  Airway Mallampati: II  TM Distance: >3 FB Neck ROM: Full    Dental no notable dental hx.    Pulmonary neg pulmonary ROS,  breath sounds clear to auscultation  Pulmonary exam normal       Cardiovascular negative cardio ROS  Rhythm:Regular Rate:Normal     Neuro/Psych negative neurological ROS  negative psych ROS   GI/Hepatic negative GI ROS, Neg liver ROS,   Endo/Other  Hypothyroidism   Renal/GU negative Renal ROS  negative genitourinary   Musculoskeletal negative musculoskeletal ROS (+)   Abdominal   Peds negative pediatric ROS (+)  Hematology negative hematology ROS (+)   Anesthesia Other Findings   Reproductive/Obstetrics negative OB ROS                             Anesthesia Physical Anesthesia Plan  ASA: II  Anesthesia Plan: General   Post-op Pain Management:    Induction: Intravenous  Airway Management Planned: Oral ETT  Additional Equipment:   Intra-op Plan:   Post-operative Plan: Extubation in OR  Informed Consent: I have reviewed the patients History and Physical, chart, labs and discussed the procedure including the risks, benefits and alternatives for the proposed anesthesia with the patient or authorized representative who has indicated his/her understanding and acceptance.   Dental advisory given  Plan Discussed with: CRNA  Anesthesia Plan Comments:         Anesthesia Quick Evaluation

## 2015-04-03 NOTE — Transfer of Care (Signed)
Immediate Anesthesia Transfer of Care Note  Patient: Christy [name]  Procedure(s) Performed: Procedure(s): APPENDECTOMY LAPAROSCOPIC (N/A)  Patient Location: PACU  Anesthesia Type:General  Level of Consciousness: awake, sedated and patient cooperative  Airway & Oxygen Therapy: Patient Spontanous Breathing and Patient connected to face mask oxygen  Post-op Assessment: Report given to RN and Post -op Vital signs reviewed and stable  Post vital signs: Reviewed and stable  Last Vitals:  Filed Vitals:   04/03/15 0530  BP: 102/57  Pulse: 89  Temp:   Resp: 18    Complications: No apparent anesthesia complications

## 2015-04-04 ENCOUNTER — Encounter (HOSPITAL_COMMUNITY): Payer: Self-pay | Admitting: General Surgery

## 2015-04-04 MED ORDER — FAMOTIDINE 20 MG PO TABS
20.0000 mg | ORAL_TABLET | Freq: Two times a day (BID) | ORAL | Status: DC
  Administered 2015-04-04 (×2): 20 mg via ORAL
  Filled 2015-04-04 (×5): qty 1

## 2015-04-04 MED ORDER — B COMPLEX-C PO TABS
1.0000 | ORAL_TABLET | Freq: Every day | ORAL | Status: DC
  Administered 2015-04-04: 1 via ORAL
  Filled 2015-04-04 (×2): qty 1

## 2015-04-04 MED ORDER — ALUM & MAG HYDROXIDE-SIMETH 200-200-20 MG/5ML PO SUSP
30.0000 mL | Freq: Four times a day (QID) | ORAL | Status: DC | PRN
  Administered 2015-04-04 (×2): 30 mL via ORAL
  Filled 2015-04-04 (×2): qty 30

## 2015-04-04 MED ORDER — LEVOTHYROXINE SODIUM 50 MCG PO TABS
50.0000 ug | ORAL_TABLET | Freq: Every day | ORAL | Status: DC
  Filled 2015-04-04 (×2): qty 1

## 2015-04-04 MED ORDER — IBUPROFEN 200 MG PO TABS: 600.0000 mg | ORAL_TABLET | Freq: Four times a day (QID) | ORAL | Status: DC | PRN

## 2015-04-04 MED ORDER — OXYCODONE HCL 5 MG PO TABS
5.0000 mg | ORAL_TABLET | ORAL | Status: DC | PRN
  Administered 2015-04-04: 10 mg via ORAL
  Filled 2015-04-04: qty 2

## 2015-04-04 MED ORDER — HEPARIN SODIUM (PORCINE) 5000 UNIT/ML IJ SOLN
5000.0000 [IU] | Freq: Three times a day (TID) | INTRAMUSCULAR | Status: DC
  Administered 2015-04-04 – 2015-04-05 (×2): 5000 [IU] via SUBCUTANEOUS
  Filled 2015-04-04 (×6): qty 1

## 2015-04-04 MED ORDER — IBUPROFEN 200 MG PO TABS: ORAL_TABLET | ORAL | Status: DC

## 2015-04-04 MED ORDER — OXYCODONE HCL 5 MG PO TABS: 5.0000 mg | ORAL_TABLET | ORAL | Status: DC | PRN

## 2015-04-04 MED ORDER — LISDEXAMFETAMINE DIMESYLATE 30 MG PO CAPS: 60.0000 mg | ORAL_CAPSULE | Freq: Every day | ORAL | Status: DC

## 2015-04-04 MED ORDER — VENLAFAXINE HCL ER 150 MG PO CP24
150.0000 mg | ORAL_CAPSULE | Freq: Every day | ORAL | Status: DC
  Administered 2015-04-04: 150 mg via ORAL
  Filled 2015-04-04 (×2): qty 1

## 2015-04-04 NOTE — Progress Notes (Signed)
UR complete 

## 2015-04-04 NOTE — Discharge Summary (Signed)
Physician Discharge Summary  Patient ID: Christy [name] MRN: [id number] DOB/AGE: 59-10-1956 59 y.o.  Admit date: 04/02/2015 Discharge date: 04/05/2015  Admission Diagnoses:  APPENDICITIS Hx of breast cancer ADD Hypothyroid Body mass index is 29.4 Hx of anemia HX OF gastric bypass  Discharge Diagnoses:  APPENDICITIS Hx of breast cancer ADD Hypothyroid Body mass index is 29.4 Hx of anemia HX OF gastric bypass   Active Problems:   Appendicitis, acute   Appendicitis   PROCEDURES: S/p laparoscopic appendectomy 04/03/15, Dr. Fredrik Cove III   Hospital Course: Pt. awoke yesterday morning not feeling well. She had some abdominal bloating and a downward urge. In the later part of the day, she began having some right lower quadrant discomfort with some nausea. She had a poor appetite. 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 At the day progressed the pain worsened. She had some chills. She had some diarrhea. She subsequently went to [facility] in [facility]. She was noted to have right lower quadrant tenderness as well as a leukocytosis. A CT scan was performed which was consistent with acute appendicitis. She was transferred here for further treatment. She received Rocephin while at the [facility] in [facility]. She was admitted by Dr. Zella Richer and taken to the OR the following day by Dr. Marlou Starks.  She has done well post op, very sore, but tolerating clears.  We mobilized her and stopped the IV fluids.  We will advance her diet and hope to send her home later this afternoon.  She was not ready till AM of 04/05/15.  Wounds all look fine.  Condition on D/C:  Improved   Disposition: 06-Home-Health Care Svc     Medication List    TAKE these medications        B-complex with vitamin C tablet  Take 1 tablet by mouth daily.     cyclobenzaprine 10 MG tablet  Commonly known as:  FLEXERIL  Take 1 tablet (10 mg total) by mouth 3 (three) times daily as needed  for muscle spasms.     ibuprofen 200 MG tablet  Commonly known as:  ADVIL,MOTRIN  You can take 2-3 tablets every 6 hours as needed for pain.     letrozole 2.5 MG tablet  Commonly known as:  FEMARA  TAKE 1 TABLET BY MOUTH ONCE DAILY     levothyroxine 50 MCG tablet  Commonly known as:  SYNTHROID, LEVOTHROID  TAKE 1 TABLET BY MOUTH ONCE DAILY     lisdexamfetamine 60 MG capsule  Commonly known as:  VYVANSE  Take 1 capsule (60 mg total) by mouth every morning.     MAGNESIUM OXIDE PO  Take 1 tablet by mouth daily.     multivitamin capsule  Take 3 capsules by mouth daily.     oxyCODONE 5 MG immediate release tablet  Commonly known as:  Oxy IR/ROXICODONE  Take 1-2 tablets (5-10 mg total) by mouth every 4 (four) hours as needed for moderate pain.     ranitidine 300 MG tablet  Commonly known as:  ZANTAC  TAKE 1 TABLET BY MOUTH AT BEDTIME.     valACYclovir 500 MG tablet  Commonly known as:  VALTREX  Take 2 tablets (1,000 mg total) by mouth daily as needed.     venlafaxine XR 150 MG 24 hr capsule  Commonly known as:  EFFEXOR-XR  Take 1 capsule (150 mg total) by mouth daily with breakfast.     Vitamin D 2000  UNITS tablet  Take 200-5,000 Units by mouth daily.       Follow-up Information    Follow up with [place] On 04/23/2015.   Why:  You have an appointment at 2:15 PM, be at the office 30 minutes early for check in.   Contact information:   [place] [id number] [phone]      Follow up with Wyatt Haste, MD.   Specialty:  Family Medicine   Why:  Call for follow up of medical issues and let him know you had surgery.   Contact information:   [address] [place] [address] [phone]       Signed: Earnstine Regal 04/05/2015, 9:00 AM

## 2015-04-04 NOTE — Progress Notes (Signed)
Anxious, having indigestion, not eating yet.  Will keep her over night.  Trying to get her meds right.

## 2015-04-04 NOTE — Discharge Instructions (Signed)
[facility] ______[facility] °LAPAROSCOPIC SURGERY: POST OP INSTRUCTIONS °Always review your discharge instruction sheet given to you by the facility where your surgery was performed. °IF YOU HAVE DISABILITY OR FAMILY LEAVE FORMS, YOU MUST BRING THEM TO THE OFFICE FOR PROCESSING.   °DO NOT GIVE THEM TO YOUR DOCTOR. ° °1. A prescription for pain medication may be given to you upon discharge.  Take your pain medication as prescribed, if needed.  If narcotic pain medicine is not needed, then you may take acetaminophen (Tylenol) or ibuprofen (Advil) as needed. °2. Take your usually prescribed medications unless otherwise directed. °3. If you need a refill on your pain medication, please contact your pharmacy.  They will contact our office to request authorization. Prescriptions will not be filled after 5pm or on week-ends. °4. You should follow a light diet the first few days after arrival home, such as soup and crackers, etc.  Be sure to include lots of fluids daily. °5. Most patients will experience some swelling and bruising in the area of the incisions.  Ice packs will help.  Swelling and bruising can take several days to resolve.  °6. It is common to experience some constipation if taking pain medication after surgery.  Increasing fluid intake and taking a stool softener (such as Colace) will usually help or prevent this problem from occurring.  A mild laxative (Milk of Magnesia or Miralax) should be taken according to package instructions if there are no bowel movements after 48 hours. °7. Unless discharge instructions indicate otherwise, you may remove your bandages 24-48 hours after surgery, and you may shower at that time.  You may have steri-strips (small skin tapes) in place directly over the incision.  These strips should be left on the skin for 7-10 days.  If your surgeon used skin glue on the incision, you may shower in 24 hours.  The glue will flake off over the next 2-3 weeks.  Any sutures or  staples will be removed at the office during your follow-up visit. °8. ACTIVITIES:  You may resume regular (light) daily activities beginning the next day--such as daily self-care, walking, climbing stairs--gradually increasing activities as tolerated.  You may have sexual intercourse when it is comfortable.  Refrain from any heavy lifting or straining until approved by your doctor. °a. You may drive when you are no longer taking prescription pain medication, you can comfortably wear a seatbelt, and you can safely maneuver your car and apply brakes. °b. RETURN TO WORK:  __________________________________________________________ °9. You should see your doctor in the office for a follow-up appointment approximately 2-3 weeks after your surgery.  Make sure that you call for this appointment within a day or two after you arrive home to insure a convenient appointment time. °10. OTHER INSTRUCTIONS: __________________________________________________________________________________________________________________________ __________________________________________________________________________________________________________________________ °WHEN TO CALL YOUR DOCTOR: °1. Fever over 101.0 °2. Inability to urinate °3. Continued bleeding from incision. °4. Increased pain, redness, or drainage from the incision. °5. Increasing abdominal pain ° °The clinic staff is available to answer your questions during regular business hours.  Please don’t hesitate to call and ask to speak to one of the nurses for clinical concerns.  If you have a medical emergency, go to the nearest emergency room or call 911.  A surgeon from [facility] is always on call at the hospital. °[address], [address], [place], [place]  [address] ? [address] [address], [place],    [address] °[phone] ? [phone] ? FAX [phone] °Web site:   [email or web address] ° °Laparoscopic Appendectomy °Care After °Refer to this sheet  in the next few weeks. These instructions provide you with information on caring for yourself after your procedure. Your caregiver may also give you more specific instructions. Your treatment has been planned according to current medical practices, but problems sometimes occur. Call your caregiver if you have any problems or questions after your procedure. °HOME CARE INSTRUCTIONS °· Do not drive while taking narcotic pain medicines. °· Use stool softener if you become constipated from your pain medicines. °· Change your bandages (dressings) as directed. °· Keep your wounds clean and dry. You may wash the wounds gently with soap and water. Gently pat the wounds dry with a clean towel. °· Do not take baths, swim, or use hot tubs for 10 days, or as instructed by your caregiver. °· Only take over-the-counter or prescription medicines for pain, discomfort, or fever as directed by your caregiver. °· You may continue your normal diet as directed. °· Do not lift more than 10 pounds (4.5 kg) or play contact sports for 3 weeks, or as directed. °· Slowly increase your activity after surgery. °· Take deep breaths to avoid getting a lung infection (pneumonia). °SEEK MEDICAL CARE IF: °· You have redness, swelling, or increasing pain in your wounds. °· You have pus coming from your wounds. °· You have drainage from a wound that lasts longer than 1 day. °· You notice a bad smell coming from the wounds or dressing. °· Your wound edges break open after stitches (sutures) have been removed. °· You notice increasing pain in the shoulders (shoulder strap areas) or near your shoulder blades. °· You develop dizzy episodes or fainting while standing. °· You develop shortness of breath. °· You develop persistent nausea or vomiting. °· You cannot control your bowel functions or lose your appetite. °· You develop diarrhea. °SEEK IMMEDIATE MEDICAL CARE IF:  °· You have a fever. °· You develop a rash. °· You have difficulty breathing or sharp  pains in your chest. °· You develop any reaction or side effects to medicines given. °MAKE SURE YOU: °· Understand these instructions. °· Will watch your condition. °· Will get help right away if you are not doing well or get worse. °Document Released: 12/14/2005 Document Revised: 03/07/2012 Document Reviewed: 06/23/2011 °ExitCare® Patient Information ©2015 [employer]. This information is not intended to replace advice given to you by your health care provider. Make sure you discuss any questions you have with your health care provider. ° °

## 2015-04-04 NOTE — Progress Notes (Signed)
1 Day Post-Op  Subjective: Complaining of pain, didn't want to eat, had some tea for breakfast.    Objective: Vital signs in last 24 hours: Temp:  [97.8 F (36.6 C)-99.1 F (37.3 C)] 98.4 F (36.9 C) (04/07 0554) Pulse Rate:  [62-114] 89 (04/07 0554) Resp:  [6-18] 16 (04/07 0554) BP: (105-162)/(56-75) 162/56 mmHg (04/07 0554) SpO2:  [93 %-100 %] 96 % (04/07 0554)   Diet:  Clears Afebrile, VSS No labs Intake/Output from previous day: 04/06 0701 - 04/07 0700 In: 1900 [I.V.:1900] Out: 200 [Urine:200] Intake/Output this shift: Total I/O In: 480 [P.O.:480] Out: -   General appearance: alert, cooperative and no distress GI: soft, sore and tender, Port sites look fine.   Lab Results:   Recent Labs  04/02/15 1830  WBC 14.8*  HGB 14.2  HCT 43.2  PLT 184    BMET  Recent Labs  04/02/15 1830  NA 135  K 4.0  CL 98  CO2 28  GLUCOSE 133*  BUN 6  CREATININE 0.52  CALCIUM 8.9   PT/INR No results for input(s): LABPROT, INR in the last 72 hours.  No results for input(s): AST, ALT, ALKPHOS, BILITOT, PROT, ALBUMIN in the last 168 hours.   Lipase  No results found for: LIPASE   Studies/Results: Ct Abdomen Pelvis W Contrast  04/02/2015   CLINICAL DATA:  Right lower quadrant abdominal pain since yesterday. Nausea. Fever. Leukocytosis. Previous bilateral mastectomy in 2008 for breast cancer. Previous cholecystectomy, hysterectomy and gastric bypass.  EXAM: CT ABDOMEN AND PELVIS WITH CONTRAST  TECHNIQUE: Multidetector CT imaging of the abdomen and pelvis was performed using the standard protocol following bolus administration of intravenous contrast.  CONTRAST:  81mL OMNIPAQUE IOHEXOL 300 MG/ML SOLN, 161mL OMNIPAQUE IOHEXOL 300 MG/ML SOLN  COMPARISON:  01/27/2010.  FINDINGS: Bilateral breast reconstructions with implants are again demonstrated. Mild diffuse low density of the liver relative to the spleen. Stable diffuse intrahepatic and extrahepatic biliary ductal dilatation.  Cholecystectomy clips. Somewhat small pancreas with little change.  Diffusely enlarged appendix with extensive periappendiceal soft tissue stranding. The appendix measures 12.7 mm in diameter. The appendix is located in the upper right pelvis. There are mildly prominent adjacent right lower quadrant mesenteric lymph nodes. No fluid collections or free peritoneal air are seen.  Small hiatal hernia. Post gastric bypass changes. Unremarkable mid colon and small bowel with small sigmoid colon diverticula noted. Unremarkable spleen, adrenal glands, kidneys and urinary bladder. Surgically absent uterus. No adnexal masses.  Clear lung bases.  Unremarkable bones.  IMPRESSION: 1. Marked acute appendicitis without abscess and with mild associated reactive adenopathy. 2. Mild diffuse hepatic steatosis. 3. Chronic post cholecystectomy biliary ductal dilatation. 4. Small sliding hiatal hernia. These results will be called to the ordering clinician or representative by the Radiologist Assistant, and communication documented in the PACS or zVision Dashboard.   Electronically Signed   By: Claudie Revering M.D.   On: 04/02/2015 20:09    Medications: . cefTRIAXone (ROCEPHIN)  IV  1 g Intravenous Q24H  . metronidazole  500 mg Intravenous 3 times per day    Assessment/Plan APPENDICITIS S/p laparoscopic appendectomy 04/03/15, Dr. Fredrik Cove III Hx of breast cancer ADD Hypothyroid Body mass index is 29.4 Hx of anemia HX OF gastric bypass Heparin/SCD for DVT prophylaxis Day 3 antibiotics today  Will stop with d/c.   Plan:  Mobilize, regular diet, PO pain meds, saline lock IV, hopefully home after lunch.     LOS: 2 days    Christy [name] 04/04/2015

## 2015-04-05 NOTE — Progress Notes (Signed)
2 Days Post-Op  Subjective: Doing better this AM, can go after her husband gets here.  Objective: Vital signs in last 24 hours: Temp:  [98.2 F (36.8 C)-98.4 F (36.9 C)] 98.4 F (36.9 C) (04/08 0548) Pulse Rate:  [70-84] 84 (04/08 0548) Resp:  [16-20] 16 (04/08 0548) BP: (149-169)/(66-88) 164/66 mmHg (04/08 0548) SpO2:  [95 %-100 %] 96 % (04/08 0548)  720 PO yesterday  Regular diet Afebrile, VSS No labs Intake/Output from previous day: 04/07 0701 - 04/08 0700 In: 720 [P.O.:720] Out: -  Intake/Output this shift: Total I/O In: 240 [P.O.:240] Out: -   General appearance: alert, cooperative and no distress GI: soft sore, port sites all look good  Lab Results:   Recent Labs  04/02/15 1830  WBC 14.8*  HGB 14.2  HCT 43.2  PLT 184    BMET  Recent Labs  04/02/15 1830  NA 135  K 4.0  CL 98  CO2 28  GLUCOSE 133*  BUN 6  CREATININE 0.52  CALCIUM 8.9   PT/INR No results for input(s): LABPROT, INR in the last 72 hours.  No results for input(s): AST, ALT, ALKPHOS, BILITOT, PROT, ALBUMIN in the last 168 hours.   Lipase  No results found for: LIPASE   Studies/Results: No results found.  Medications: . B-complex with vitamin C  1 tablet Oral Daily  . cefTRIAXone (ROCEPHIN)  IV  1 g Intravenous Q24H  . famotidine  20 mg Oral BID  . heparin subcutaneous  5,000 Units Subcutaneous 3 times per day  . levothyroxine  50 mcg Oral QAC breakfast  . lisdexamfetamine  60 mg Oral Daily  . metronidazole  500 mg Intravenous 3 times per day  . venlafaxine XR  150 mg Oral Daily    Assessment/Plan APPENDICITIS S/p laparoscopic appendectomy 04/03/15, Dr. Fredrik Cove III Hx of breast cancer ADD Hypothyroid Body mass index is 29.4 Hx of anemia HX OF gastric bypass Heparin/SCD for DVT prophylaxis Day 3 antibiotics today Will stop with d/c.    Plan:  Home today.   LOS: 3 days    Salim Forero 04/05/2015

## 2015-04-11 ENCOUNTER — Other Ambulatory Visit: Payer: Self-pay | Admitting: Oncology

## 2015-04-11 DIAGNOSIS — C50919 Malignant neoplasm of unspecified site of unspecified female breast: Secondary | ICD-10-CM

## 2015-04-18 ENCOUNTER — Other Ambulatory Visit: Payer: Self-pay | Admitting: Oncology

## 2015-04-19 ENCOUNTER — Telehealth: Payer: Self-pay | Admitting: Oncology

## 2015-04-19 NOTE — Telephone Encounter (Signed)
Spoke with patient and she is aware of her appoinments °

## 2015-05-06 ENCOUNTER — Other Ambulatory Visit: Payer: 59

## 2015-05-06 ENCOUNTER — Ambulatory Visit: Payer: 59 | Admitting: Oncology

## 2015-05-08 ENCOUNTER — Telehealth: Payer: Self-pay | Admitting: Family Medicine

## 2015-05-08 DIAGNOSIS — F900 Attention-deficit hyperactivity disorder, predominantly inattentive type: Secondary | ICD-10-CM

## 2015-05-08 MED ORDER — LISDEXAMFETAMINE DIMESYLATE 60 MG PO CAPS: 60.0000 mg | ORAL_CAPSULE | ORAL | Status: DC

## 2015-05-08 NOTE — Telephone Encounter (Signed)
Pt called for Vyvanse refill.  She made a cpe appt for June.  Please post rx on back door.

## 2015-05-24 ENCOUNTER — Encounter: Payer: Self-pay | Admitting: Family Medicine

## 2015-05-24 ENCOUNTER — Ambulatory Visit (INDEPENDENT_AMBULATORY_CARE_PROVIDER_SITE_OTHER): Payer: 59 | Admitting: Family Medicine

## 2015-05-24 VITALS — BP 150/100 | HR 68 | Ht 65.75 in | Wt 181.6 lb

## 2015-05-24 DIAGNOSIS — R42 Dizziness and giddiness: Secondary | ICD-10-CM

## 2015-05-24 DIAGNOSIS — R030 Elevated blood-pressure reading, without diagnosis of hypertension: Secondary | ICD-10-CM

## 2015-05-24 DIAGNOSIS — G44209 Tension-type headache, unspecified, not intractable: Secondary | ICD-10-CM

## 2015-05-24 DIAGNOSIS — IMO0001 Reserved for inherently not codable concepts without codable children: Secondary | ICD-10-CM

## 2015-05-24 NOTE — Patient Instructions (Addendum)
  Continue low sodium diet. Try and find some ways to reduce your stress--relaxation techniques, or exercise. It is recommended that you get at least 30 minutes of aerobic exercise at least 5 days/week (for weight loss, you may need as much as 60-90 minutes). This can be any activity that gets your heart rate up. This can be divided in 10-15 minute intervals if needed, but try and build up your endurance at least once a week.  Weight bearing exercise is also recommended twice weekly.  Check your blood pressure at least 2-3 times/week, sometimes morning, other times evening.  Record this on a log, keeping track of date/time (use separate columns for morning/evening), and a column for you to add comments.   Bring this log (and any automatic BP cuff to verify the accuracy) to your physical  I believe that some of the dizziness is related to allergies. Use the Allegra daily.  If you find there is a component to the vertigo that is positional, and it is worsening, PT can be helpful. If you have significant/severe vertigo, you may use meclizine as needed.  Headaches are likely related to stress, rather than your blood pressure. Use ibuprofen sparingly, if needed for headaches. Consider talking to your dentist about a bite guard Consider counseling or support groups for dealing with aging parents

## 2015-05-24 NOTE — Progress Notes (Signed)
Chief Complaint  Patient presents with  . Advice Only    last couple of weeks has been having some bp reading around 140's/99. Headaches as well as dizziness.    Patient presents accompanied by her husband with concerns over high blood pressure, headache and dizziness. She has been having these symptoms over the last couple of weeks. Dizziness is described as more like vertigo, dysequilibrium, not lightheadedness.  She  notices it with head movements, rolling over in bed. Can't think of a particular postiion that triggers it. She feels like she is getting a cold today, with scratchy throat, but also has been dealing with allergies--sneezing, itchy watery eyes, itchy ears, postnasal drip. She uses Allegra "when she remembers" only 2-3 in the last week.  It helps when she takes it.  Headaches are described as a band around her head, like tension headaches. Some pain in her forehead.  Denies sinus headache.  She thinks it is all due to stress. She admits to clenching/grinding.  Significant stressors: Feb-March had issues related to mother's health (atrial fib) January--husband having mini-strokes, scheduled for cerebral angioplasty soon 59 yo son was pulled out of [facility] due to depression (doing better now) Job stress (related to administrative issues; starting to look elsewhere, doesn't think she can cut back hours) Appendectomy last month  Stress reduction:  1 margarita each night.  Reports "sleep" is her stress reduction. Some trouble sleeping related to being postmenopausal. Doesn't get regular exercise.  Went vegan recently, along with her husband--lost 8# Change in bowels since change in diet--looser, more frequent stools Trying to reduce salt in her diet.  She is not having neurologic symptoms (numbness, tingling, weakness, memory concerns, etc) other than the mild dysequilibrium discussed above.  PMH, [place] [facility], FH reviewed  Outpatient Encounter Prescriptions as of 05/24/2015  Medication  Sig Note  . B Complex-C (B-COMPLEX WITH VITAMIN C) tablet Take 1 tablet by mouth daily.   Marland Kitchen levothyroxine (SYNTHROID, LEVOTHROID) 50 MCG tablet TAKE 1 TABLET BY MOUTH ONCE DAILY   . lisdexamfetamine (VYVANSE) 60 MG capsule Take 1 capsule (60 mg total) by mouth every morning.   Marland Kitchen MAGNESIUM OXIDE PO Take 1 tablet by mouth daily.   . Multiple Vitamin (MULTIVITAMIN) capsule Take 3 capsules by mouth daily.    . ranitidine (ZANTAC) 300 MG tablet TAKE 1 TABLET BY MOUTH AT BEDTIME. (Patient taking differently: TAKE 1 TABLET BY MOUTH DAILY AS NEEDED FOR ACID REFLUX)   . venlafaxine XR (EFFEXOR-XR) 150 MG 24 hr capsule Take 1 capsule (150 mg total) by mouth daily with breakfast.   . VITAMIN D, CHOLECALCIFEROL, PO Take 5,000 Int'l Units by mouth daily.   . cyclobenzaprine (FLEXERIL) 10 MG tablet TAKE 1 TABLET BY MOUTH 3 TIMES DAILY AS NEEDED FOR MUSCLE SPASMS (Patient not taking: Reported on 05/24/2015)   . ibuprofen (ADVIL,MOTRIN) 200 MG tablet You can take 2-3 tablets every 6 hours as needed for pain. (Patient not taking: Reported on 05/24/2015)   . valACYclovir (VALTREX) 500 MG tablet Take 2 tablets (1,000 mg total) by mouth daily as needed. (Patient not taking: Reported on 05/24/2015)   . [DISCONTINUED] Cholecalciferol (VITAMIN D) 2000 UNITS tablet Take 200-5,000 Units by mouth daily.    . [DISCONTINUED] letrozole ([place]) 2.5 MG tablet TAKE 1 TABLET BY MOUTH ONCE DAILY (Patient not taking: Reported on 04/02/2015)   . [DISCONTINUED] ondansetron (ZOFRAN) 4 MG tablet  05/24/2015: Received from: External Pharmacy  . [DISCONTINUED] oxyCODONE (OXY IR/ROXICODONE) 5 MG immediate release tablet Take 1-2  tablets (5-10 mg total) by mouth every 4 (four) hours as needed for moderate pain.    No facility-administered encounter medications on file as of 05/24/2015.   Allergies  Allergen Reactions  . Morphine And Related Swelling    And itching  . Tylenol [Acetaminophen]     Pt instructed NOT to have tylenol after  extensive gastric bypass   ROS:  No fevers, chills, lightheadedness, syncope.  +URI/allergy symptoms and vertigo/dysequilibrium as per HPI.  No sore throat, ear pain.  No chest pain, palpitations, shortness of breath. No GI or GU complaints.  No bleeding, bruising, rash, edema.  PHYSICAL EXAM: BP 150/100 mmHg  Pulse 68  Ht 5' 5.75" (1.67 m)  Wt 181 lb 9.6 oz (82.373 kg)  BMI 29.54 kg/m2  144/88 on repeat by MD Well appearing female, accompanied by her husband.   HEENT: PERRL, EOMI, conjunctiva clear.  TM's and EAC's normal.  Nasal mucosa only mildly edematous, no erythema or purulence.  Sinuses nontender.  OP is clear Neck: no lymphadenopathy, thyromegaly or mass Heart: regular rate and rhythm without murmur Lungs: clear bilaterally Abdomen: soft, nontender, no mass Extremities: no edema, normal pulse Neuro: alert and oriented x 3.  Cranial nerves intact.  DTR's are symmetric, strength and sensation are normal.  No nystagmus.  No vertigo or dizziness during visit, and none elicited with position changes. Skin: no rashes Psych: normal mood, affect, hygiene and grooming.  Normal eye contact, speech.  ASSESSMENT/PLAN:  Tension-type headache, not intractable, unspecified chronicity pattern - related to stress, bruxism/clenching. NSAIDs prn; stress reduction  Elevated blood pressure - low sodium diet, regular exercise, stress reduction. continue to monitor and bring list to upcoming visit  Vertigo - mild, not positional. suspect related to URI or allergies.  Take allegra daily; meclizine prn if worse.   Continue low sodium diet. Try and find some ways to reduce your stress--relaxation techniques, or exercise. It is recommended that you get at least 30 minutes of aerobic exercise at least 5 days/week (for weight loss, you may need as much as 60-90 minutes). This can be any activity that gets your heart rate up. This can be divided in 10-15 minute intervals if needed, but try and build up  your endurance at least once a week.  Weight bearing exercise is also recommended twice weekly.  Check your blood pressure at least 2-3 times/week, sometimes morning, other times evening.  Record this on a log, keeping track of date/time (use separate columns for morning/evening), and a column for you to add comments.  Bring this log (and any automatic BP cuff to verify the accuracy) to your physical  I believe that some of the dizziness is related to allergies. Use the Allegra daily.  If you find there is a component to the vertigo that is positional, and it is worsening, PT can be helpful. If you have significant/severe vertigo, you may use meclizine as needed.  Headaches are likely related to stress, rather than your blood pressure. Use ibuprofen sparingly, if needed for headaches. Consider talking to your dentist about a bite guard Consider counseling or support groups for dealing with aging parents  CPE as scheduled  40-45 minutes spent with this patient and her husband, more than 1/2 of which was spent counseling

## 2015-05-26 ENCOUNTER — Encounter: Payer: Self-pay | Admitting: Family Medicine

## 2015-05-30 ENCOUNTER — Telehealth: Payer: Self-pay | Admitting: Oncology

## 2015-05-30 ENCOUNTER — Telehealth: Payer: Self-pay | Admitting: *Deleted

## 2015-05-30 NOTE — Telephone Encounter (Signed)
Returned patients call as she needed to r/s

## 2015-05-30 NOTE — Telephone Encounter (Signed)
NOTIFIED DR.LIVESAY'S SCHEDULER, Aldan. SHE WILL CALL PT. CONCERNING HER 06/03/15 APPOINTMENT.

## 2015-06-03 ENCOUNTER — Other Ambulatory Visit: Payer: 59

## 2015-06-03 ENCOUNTER — Ambulatory Visit: Payer: 59 | Admitting: Oncology

## 2015-06-07 ENCOUNTER — Other Ambulatory Visit: Payer: Self-pay | Admitting: Family Medicine

## 2015-06-07 NOTE — Telephone Encounter (Signed)
Is this okay?

## 2015-06-10 ENCOUNTER — Encounter: Payer: 59 | Admitting: Family Medicine

## 2015-06-23 ENCOUNTER — Other Ambulatory Visit: Payer: Self-pay | Admitting: Oncology

## 2015-06-24 ENCOUNTER — Ambulatory Visit: Payer: 59 | Admitting: Oncology

## 2015-06-24 ENCOUNTER — Other Ambulatory Visit: Payer: 59

## 2015-07-09 ENCOUNTER — Encounter: Payer: Self-pay | Admitting: Internal Medicine

## 2015-07-09 ENCOUNTER — Encounter: Payer: Self-pay | Admitting: Genetic Counselor

## 2015-07-30 ENCOUNTER — Encounter: Payer: Self-pay | Admitting: *Deleted

## 2015-08-01 ENCOUNTER — Other Ambulatory Visit: Payer: Self-pay | Admitting: Family Medicine

## 2015-08-23 ENCOUNTER — Telehealth: Payer: Self-pay | Admitting: Family Medicine

## 2015-08-23 DIAGNOSIS — F900 Attention-deficit hyperactivity disorder, predominantly inattentive type: Secondary | ICD-10-CM

## 2015-08-23 MED ORDER — LISDEXAMFETAMINE DIMESYLATE 60 MG PO CAPS: 60.0000 mg | ORAL_CAPSULE | ORAL | Status: DC

## 2015-08-23 NOTE — Telephone Encounter (Signed)
Pt requests refill Vyvanse, states knows she needs an appt but has new job and waiting on her new ins and then will schedule an appt. Please call when ready

## 2015-09-10 ENCOUNTER — Telehealth: Payer: Self-pay | Admitting: Oncology

## 2015-09-10 NOTE — Telephone Encounter (Signed)
Return voicemail. Patient confirmed appointment for 09/22. Reschedule from [facility] June appointment.

## 2015-09-15 ENCOUNTER — Other Ambulatory Visit: Payer: Self-pay | Admitting: Oncology

## 2015-09-19 ENCOUNTER — Ambulatory Visit (HOSPITAL_BASED_OUTPATIENT_CLINIC_OR_DEPARTMENT_OTHER): Payer: PRIVATE HEALTH INSURANCE | Admitting: Oncology

## 2015-09-19 ENCOUNTER — Other Ambulatory Visit (HOSPITAL_BASED_OUTPATIENT_CLINIC_OR_DEPARTMENT_OTHER): Payer: PRIVATE HEALTH INSURANCE

## 2015-09-19 ENCOUNTER — Other Ambulatory Visit: Payer: Self-pay | Admitting: *Deleted

## 2015-09-19 ENCOUNTER — Encounter: Payer: Self-pay | Admitting: Oncology

## 2015-09-19 ENCOUNTER — Telehealth: Payer: Self-pay | Admitting: Oncology

## 2015-09-19 VITALS — BP 148/73 | HR 84 | Temp 98.6°F | Resp 18 | Ht 65.75 in | Wt 181.3 lb

## 2015-09-19 DIAGNOSIS — E785 Hyperlipidemia, unspecified: Secondary | ICD-10-CM | POA: Diagnosis not present

## 2015-09-19 DIAGNOSIS — C50919 Malignant neoplasm of unspecified site of unspecified female breast: Secondary | ICD-10-CM

## 2015-09-19 DIAGNOSIS — Z853 Personal history of malignant neoplasm of breast: Secondary | ICD-10-CM | POA: Diagnosis not present

## 2015-09-19 DIAGNOSIS — M8580 Other specified disorders of bone density and structure, unspecified site: Secondary | ICD-10-CM

## 2015-09-19 DIAGNOSIS — D509 Iron deficiency anemia, unspecified: Secondary | ICD-10-CM | POA: Diagnosis not present

## 2015-09-19 DIAGNOSIS — Z23 Encounter for immunization: Secondary | ICD-10-CM | POA: Diagnosis not present

## 2015-09-19 LAB — CBC WITH DIFFERENTIAL/PLATELET
BASO%: 1.3 % (ref 0.0–2.0)
Basophils Absolute: 0.1 10*3/uL (ref 0.0–0.1)
EOS%: 7.1 % — ABNORMAL HIGH (ref 0.0–7.0)
Eosinophils Absolute: 0.3 10*3/uL (ref 0.0–0.5)
HCT: 41.3 % (ref 34.8–46.6)
HEMOGLOBIN: 13.6 g/dL (ref 11.6–15.9)
LYMPH%: 30.4 % (ref 14.0–49.7)
MCH: 29.2 pg (ref 25.1–34.0)
MCHC: 33 g/dL (ref 31.5–36.0)
MCV: 88.4 fL (ref 79.5–101.0)
MONO#: 0.4 10*3/uL (ref 0.1–0.9)
MONO%: 9.6 % (ref 0.0–14.0)
NEUT%: 51.6 % (ref 38.4–76.8)
NEUTROS ABS: 2 10*3/uL (ref 1.5–6.5)
Platelets: 145 10*3/uL (ref 145–400)
RBC: 4.67 10*6/uL (ref 3.70–5.45)
RDW: 13.9 % (ref 11.2–14.5)
WBC: 4 10*3/uL (ref 3.9–10.3)
lymph#: 1.2 10*3/uL (ref 0.9–3.3)

## 2015-09-19 LAB — LIPID PANEL
Cholesterol: 140 mg/dL (ref 125–200)
HDL: 58 mg/dL (ref >=46)
LDL Cholesterol: 69 mg/dL (ref <130)
Total CHOL/HDL Ratio: 2.4 Ratio (ref <=5.0)
Triglycerides: 66 mg/dL (ref <150)
VLDL: 13 mg/dL (ref <30)

## 2015-09-19 LAB — COMPREHENSIVE METABOLIC PANEL (CC13)
ALK PHOS: 115 U/L (ref 40–150)
ALT: 25 U/L (ref 0–55)
ANION GAP: 5 meq/L (ref 3–11)
AST: 24 U/L (ref 5–34)
Albumin: 3.7 g/dL (ref 3.5–5.0)
BUN: 7.5 mg/dL (ref 7.0–26.0)
CO2: 28 mEq/L (ref 22–29)
Calcium: 9 mg/dL (ref 8.4–10.4)
Chloride: 108 mEq/L (ref 98–109)
Creatinine: 0.6 mg/dL (ref 0.6–1.1)
Glucose: 104 mg/dl (ref 70–140)
Potassium: 5 mEq/L (ref 3.5–5.1)
Sodium: 142 mEq/L (ref 136–145)
Total Bilirubin: 0.44 mg/dL (ref 0.20–1.20)
Total Protein: 6.1 g/dL — ABNORMAL LOW (ref 6.4–8.3)

## 2015-09-19 LAB — IRON AND TIBC CHCC
%SAT: 17 % — AB (ref 21–57)
IRON: 60 ug/dL (ref 41–142)
TIBC: 359 ug/dL (ref 236–444)
UIBC: 299 ug/dL (ref 120–384)

## 2015-09-19 LAB — FERRITIN CHCC: FERRITIN: 17 ng/mL (ref 9–269)

## 2015-09-19 MED ORDER — CYCLOBENZAPRINE HCL 10 MG PO TABS: ORAL_TABLET | ORAL | Status: DC

## 2015-09-19 MED ORDER — INFLUENZA VAC SPLIT QUAD 0.5 ML IM SUSY
0.5000 mL | PREFILLED_SYRINGE | Freq: Once | INTRAMUSCULAR | Status: AC
  Administered 2015-09-19: 0.5 mL via INTRAMUSCULAR
  Filled 2015-09-19: qty 0.5

## 2015-09-19 NOTE — Progress Notes (Signed)
OFFICE PROGRESS NOTE   September 19, 2015   Physicians:J.Lalonde, (C.Streck), (J.Tomblin), V.Paul, D.Brodie, P.Toth  INTERVAL HISTORY:   Patient is seen, alone for visit, in yearly follow up of history of two primary right breast cancers, on observation since completing 7 years of adjuvant Femara in 10-2014. She has had bilateral mastectomies with reconstructions and hysterectomy with BSO. Last DEXA 09-2013, with T scores -2.1 and -2.2. Last IV feraheme was 03-2013, iron deficient related to gastric bypass. PCP Dr Miguel Rota has been well with exception of acute appendicitis 03-2015, with laparoscopic appendectomy by Dr Marlou Starks. CT AP with the appendicitis had mild diffuse hepatic steatosis and no other findings of concern. She tolerated the surgery well and has recovered completely. She has had no other illnesses, and no concerns that seem related to the breast cancer history or that treatment.   Christy [name] has remarried and is looking forward to her first grandchild, a boy, due in Nov. 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 She works 10-12 hour shifts, mostly standing. She is also walking her dogs twice daily.    Negative genetics testing prior to second breast cancer surgery Flu vaccine done 09-19-15  Review of systems as above, also: No recent infectious illness. No SOB or other respiratory symptoms. No new or different pain, tho some leg cramps intermittently - discussed sports drinks daily. Bowels and bladder ok. No LE swelling. No changes in reconstructed breasts. She feels that energy is lower, wonders if may need IV iron again. Remainder of 10 point Review of Systems negative.   ONCOLOGIC HISTORY History is of 2 primary right breast cancers, in 1993 and May 2008. The initial right breast cancer was stage 2 with 3 of 41 nodes involved, ER/PR negative and HER-2 not done in 1993. She was treated by  Dr.John Terrall Laity on NSABP B25 with adriamycin/cytoxan. The most recent breast cancer 06-2007 was 0.6 cm invasive ductal with 3 nodes negative, ER/PR + and HER2 -. She had right mastectomy and prophylactic left mastectomy , with bilateral reconstructions. She additionally had hysterectomy with BSO. She was on Femara 07-2007 thru 10-2014.   Objective:  Vital signs in last 24 hours: Weight 181 lb 4 oz (stable), BMI 29.5, 148/73, 84 regular, 18, 98.6, 100% sat.  Alert, oriented and appropriate, looks entirely comfortable and relaxed. Easily ambulatory. Respirations not labored RA   HEENT:PERRL, sclerae not icteric. Oral mucosa moist without lesions, posterior pharynx clear.  Neck supple. No JVD.  Lymphatics:no cervical,supraclavicular, axillary or inguinal adenopathy Resp: clear to auscultation bilaterally and normal percussion bilaterally Cardio: regular rate and rhythm. No gallop. GI: soft, nontender, not distended, no mass or organomegaly. Normally active bowel sounds. Surgical incisions not remarkable. Musculoskeletal/ Extremities: without pitting edema, cords, tenderness Neuro: no peripheral neuropathy. Otherwise nonfocal . Psych appropriate mood and affect Skin without rash, ecchymosis, petechiae Breasts: bilateral reconstructions without findings of concern. Axillae benign.   Lab Results:  Results for orders placed or performed in visit on 09/19/15  CBC with Differential  Result Value Ref Range   WBC 4.0 3.9 - 10.3 10e3/uL   NEUT# 2.0 1.5 - 6.5 10e3/uL   HGB 13.6 11.6 - 15.9 g/dL   HCT 41.3 34.8 - 46.6 %   Platelets 145 145 - 400 10e3/uL   MCV 88.4 79.5 - 101.0 fL   MCH 29.2 25.1 - 34.0 pg   MCHC 33.0 31.5 - 36.0  g/dL   RBC 4.67 3.70 - 5.45 10e6/uL   RDW 13.9 11.2 - 14.5 %   lymph# 1.2 0.9 - 3.3 10e3/uL   MONO# 0.4 0.1 - 0.9 10e3/uL   Eosinophils Absolute 0.3 0.0 - 0.5 10e3/uL   Basophils Absolute 0.1 0.0 - 0.1 10e3/uL   NEUT% 51.6 38.4 - 76.8 %   LYMPH% 30.4 14.0 - 49.7 %    MONO% 9.6 0.0 - 14.0 %   EOS% 7.1 (H) 0.0 - 7.0 %   BASO% 1.3 0.0 - 2.0 %    CMET available after visit Na 142, K 5.0, Cl 108, glu 104, BUN 7.5, creat 0.6, Tbili 0.44, AP 115, AST 24, ALT 25, T prot 6.1, alb 3.7, Ca 9.0, EGFR >90 Fasting lipids cholesterol 140, trig 66, HDL 58, T chol/HDL ratio 2.4, VLDL 13, LDL 69 Ferritin 17, this having been 34 in 10-2014 and 50 in 09-2013 Iron 60, %sat 17   Studies/Results:  EXAM: CT ABDOMEN AND PELVIS WITH CONTRAST  TECHNIQUE: Multidetector CT imaging of the abdomen and pelvis was performed using the standard protocol following bolus administration of intravenous contrast.  CONTRAST: 44m OMNIPAQUE IOHEXOL 300 MG/ML SOLN, 1098mOMNIPAQUE IOHEXOL 300 MG/ML SOLN  COMPARISON: 01/27/2010.  FINDINGS: Bilateral breast reconstructions with implants are again demonstrated. Mild diffuse low density of the liver relative to the spleen. Stable diffuse intrahepatic and extrahepatic biliary ductal dilatation. Cholecystectomy clips. Somewhat small pancreas with little change.  Diffusely enlarged appendix with extensive periappendiceal soft tissue stranding. The appendix measures 12.7 mm in diameter. The appendix is located in the upper right pelvis. There are mildly prominent adjacent right lower quadrant mesenteric lymph nodes. No fluid collections or free peritoneal air are seen.  Small hiatal hernia. Post gastric bypass changes. Unremarkable mid colon and small bowel with small sigmoid colon diverticula noted. Unremarkable spleen, adrenal glands, kidneys and urinary bladder. Surgically absent uterus. No adnexal masses.  Clear lung bases. Unremarkable bones.  IMPRESSION: 1. Marked acute appendicitis without abscess and with mild associated reactive adenopathy. 2. Mild diffuse hepatic steatosis. 3. Chronic post cholecystectomy biliary ductal dilatation. 4. Small sliding hiatal hernia.  Medications: I have reviewed the patient's  current medications.  DISCUSSION: we will let patient know results of iron studies and expect to set up IV feraheme 510 mg x1 upcoming. She prefers yearly follow up at this office, which is appropriate given history and treatment. Will need to follow iron studies with those visits.  Assessment/Plan:  1.History of 2 primary right breast cancers: on observation, clinically doing well. Year follow up here 2.Osteopenia preceeding aromatase inhibitor: Risk factors include aromatase inhibitor, gastric bypass. Weight bearing exercise much better now. 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   9.gastric bypass for weight control 10.laparoscopic appendectomy for acute appendicitis 03-2015 11[place] hepatic steatosis by CT 03-2015  All questions answered. Feraheme orders placed, financial staff aware for preauth. Time spent 25 min including >50% counseling and coordination of care. Cc Dr LaHeywood FootmanMD   09/19/2015, 8:41 AM

## 2015-09-19 NOTE — Telephone Encounter (Signed)
pof printed and held for one health appointment

## 2015-09-20 ENCOUNTER — Encounter: Payer: Self-pay | Admitting: Oncology

## 2015-09-20 ENCOUNTER — Other Ambulatory Visit: Payer: Self-pay | Admitting: Oncology

## 2015-09-20 ENCOUNTER — Other Ambulatory Visit: Payer: Self-pay

## 2015-09-20 ENCOUNTER — Telehealth: Payer: Self-pay | Admitting: Oncology

## 2015-09-20 NOTE — Telephone Encounter (Addendum)
Ms. Cheetham is scheduled for the feraheme on 09-26-15 at 0800. Sent Dr. Marko Plume an in-basket message to put orders in for this date.

## 2015-09-20 NOTE — Telephone Encounter (Signed)
Spoke with [name] and told her the results of the iron studies as noted below by Dr. Marko Plume as well as her c-met and lipid panel. [name] would need the feraheme infusion on a Thursday.  She gave the dates of 9-29;10-6; and 10-13. Sent an urgent POF to schedulers.

## 2015-09-20 NOTE — Telephone Encounter (Signed)
Confirmed appointment for 09/29

## 2015-09-20 NOTE — Telephone Encounter (Signed)
-----   Message from Gordy Levan, MD sent at 09/20/2015 11:37 AM EDT ----- Labs seen and need follow up: please let her know ferritin is down to 17, serum iron ok at 60 but %sat only 17. She will need IV feraheme 510 mg x1 sometime this fall - she had this last in 03-2013, when ferritin was 3.  Fine to set up when her schedule allows, send message to me with date if I need to put in orders. Let her know fasting lipids from 9-22 also    thanks

## 2015-09-23 ENCOUNTER — Encounter: Payer: Self-pay | Admitting: Oncology

## 2015-09-23 ENCOUNTER — Encounter: Payer: Self-pay | Admitting: Family Medicine

## 2015-09-24 ENCOUNTER — Other Ambulatory Visit: Payer: Self-pay | Admitting: Family Medicine

## 2015-09-24 MED ORDER — TRIAMCINOLONE ACETONIDE 0.5 % EX CREA: TOPICAL_CREAM | CUTANEOUS | Status: DC

## 2015-09-24 NOTE — Telephone Encounter (Signed)
Called Rx into [place] changed pharmacy in system

## 2015-09-26 ENCOUNTER — Ambulatory Visit (HOSPITAL_BASED_OUTPATIENT_CLINIC_OR_DEPARTMENT_OTHER): Payer: PRIVATE HEALTH INSURANCE

## 2015-09-26 VITALS — BP 150/72 | HR 88 | Temp 98.4°F | Resp 18

## 2015-09-26 DIAGNOSIS — D509 Iron deficiency anemia, unspecified: Secondary | ICD-10-CM

## 2015-09-26 MED ORDER — SODIUM CHLORIDE 0.9 % IV SOLN
Freq: Once | INTRAVENOUS | Status: AC
  Administered 2015-09-26: 09:00:00 via INTRAVENOUS

## 2015-09-26 MED ORDER — SODIUM CHLORIDE 0.9 % IV SOLN
510.0000 mg | Freq: Once | INTRAVENOUS | Status: AC
  Administered 2015-09-26: 510 mg via INTRAVENOUS
  Filled 2015-09-26: qty 17

## 2015-09-26 NOTE — Patient Instructions (Signed)
Iron Deficiency Anemia Anemia is a condition in which there are less red blood cells or hemoglobin in the blood than normal. Hemoglobin is the part of red blood cells that carries oxygen. Iron deficiency anemia is anemia caused by too little iron. It is the most common type of anemia. It may leave you tired and short of breath. CAUSES   Lack of iron in the diet.  Poor absorption of iron, as seen with intestinal disorders.  Intestinal bleeding.  Heavy periods. SIGNS AND SYMPTOMS  Mild anemia may not be noticeable. Symptoms may include:  Fatigue.  Headache.  Pale skin.  Weakness.  Tiredness.  Shortness of breath.  Dizziness.  Cold hands and feet.  Fast or irregular heartbeat. DIAGNOSIS  Diagnosis requires a thorough evaluation and physical exam by your health care provider. Blood tests are generally used to confirm iron deficiency anemia. Additional tests may be done to find the underlying cause of your anemia. These may include:  Testing for blood in the stool (fecal occult blood test).  A procedure to see inside the colon and rectum (colonoscopy).  A procedure to see inside the esophagus and stomach (endoscopy). TREATMENT  Iron deficiency anemia is treated by correcting the cause of the deficiency. Treatment may involve:  Adding iron-rich foods to your diet.  Taking iron supplements. Pregnant or breastfeeding women need to take extra iron because their normal diet usually does not provide the required amount.  Taking vitamins. Vitamin C improves the absorption of iron. Your health care provider may recommend that you take your iron tablets with a glass of orange juice or vitamin C supplement.  Medicines to make heavy menstrual flow lighter.  Surgery. HOME CARE INSTRUCTIONS   Take iron as directed by your health care provider.  If you cannot tolerate taking iron supplements by mouth, talk to your health care provider about taking them through a vein  (intravenously) or an injection into a muscle.  For the best iron absorption, iron supplements should be taken on an empty stomach. If you cannot tolerate them on an empty stomach, you may need to take them with food.  Do not drink milk or take antacids at the same time as your iron supplements. Milk and antacids may interfere with the absorption of iron.  Iron supplements can cause constipation. Make sure to include fiber in your diet to prevent constipation. A stool softener may also be recommended.  Take vitamins as directed by your health care provider.  Eat a diet rich in iron. Foods high in iron include liver, lean beef, whole-grain bread, eggs, dried fruit, and dark green leafy vegetables. SEEK IMMEDIATE MEDICAL CARE IF:   You faint. If this happens, do not drive. Call your local emergency services (911 in [place] if no other help is available.  You have chest pain.  You feel nauseous or vomit.  You have severe or increased shortness of breath with activity.  You feel weak.  You have a rapid heartbeat.  You have unexplained sweating.  You become light-headed when getting up from a chair or bed. MAKE SURE YOU:   Understand these instructions.  Will watch your condition.  Will get help right away if you are not doing well or get worse. Document Released: 12/11/2000 Document Revised: 12/19/2013 Document Reviewed: 08/21/2013 ExitCare Patient Information 2015 [employer]. This information is not intended to replace advice given to you by your health care provider. Make sure you discuss any questions you have with your health care provider.  

## 2015-12-06 ENCOUNTER — Telehealth: Payer: Self-pay | Admitting: Oncology

## 2015-12-06 NOTE — Telephone Encounter (Signed)
S/w pt, gave appt 09/24/16 @ 8am. Pt verbalized understanding.

## 2015-12-17 ENCOUNTER — Telehealth: Payer: Self-pay | Admitting: Family Medicine

## 2015-12-17 DIAGNOSIS — F900 Attention-deficit hyperactivity disorder, predominantly inattentive type: Secondary | ICD-10-CM

## 2015-12-17 NOTE — Telephone Encounter (Signed)
Pt called for refills of vyvance. Please call [phone] when ready. rx wil need to be taped to the back door.

## 2015-12-17 NOTE — Telephone Encounter (Signed)
Let her know that it is time for an office visit. It has been over a year

## 2015-12-19 MED ORDER — LISDEXAMFETAMINE DIMESYLATE 60 MG PO CAPS: 60.0000 mg | ORAL_CAPSULE | ORAL | Status: DC

## 2015-12-19 NOTE — Telephone Encounter (Signed)
Per [employer], handled

## 2015-12-19 NOTE — Telephone Encounter (Signed)
Pt has physical scheduled for 01/14/16. She says she will be out of her Vyvance as of tomorrow and needs it refilled today. Also, she has had fasting labs done with her Oncologist Dr. Tommie Sams. She wants to know if she can just have those sent over here or does she need to do them again at her physical?

## 2015-12-19 NOTE — Telephone Encounter (Signed)
Have her get a hold of the blood work and send it to [place] and we will tape the prescription to the back door

## 2016-01-14 ENCOUNTER — Encounter: Payer: 59 | Admitting: Family Medicine

## 2016-03-06 ENCOUNTER — Telehealth: Payer: Self-pay | Admitting: Family Medicine

## 2016-03-06 DIAGNOSIS — B001 Herpesviral vesicular dermatitis: Secondary | ICD-10-CM

## 2016-03-06 MED ORDER — VALACYCLOVIR HCL 500 MG PO TABS
1000.0000 mg | ORAL_TABLET | Freq: Every day | ORAL | Status: DC | PRN
Start: 2016-03-06 — End: 2017-07-08

## 2016-03-06 NOTE — Telephone Encounter (Signed)
Pt requesting refill on Valtrex be sent to [facility] at [place] for her bad outbreak she is having right now

## 2016-03-06 NOTE — Telephone Encounter (Signed)
Gets sent electronically and attached msg that pt needs physical

## 2016-03-06 NOTE — Telephone Encounter (Signed)
Call out 70mo Valtrex and order electronically. Chart shows they were due for physical in January but I don't see where this happened, so they are probably due for f/u.

## 2016-03-12 ENCOUNTER — Encounter: Payer: Self-pay | Admitting: Family Medicine

## 2016-03-12 ENCOUNTER — Ambulatory Visit (INDEPENDENT_AMBULATORY_CARE_PROVIDER_SITE_OTHER): Payer: PRIVATE HEALTH INSURANCE | Admitting: Family Medicine

## 2016-03-12 ENCOUNTER — Other Ambulatory Visit: Payer: Self-pay | Admitting: Family Medicine

## 2016-03-12 VITALS — BP 140/100 | HR 105 | Ht 66.0 in | Wt 181.0 lb

## 2016-03-12 DIAGNOSIS — J209 Acute bronchitis, unspecified: Secondary | ICD-10-CM

## 2016-03-12 DIAGNOSIS — M8580 Other specified disorders of bone density and structure, unspecified site: Secondary | ICD-10-CM | POA: Diagnosis not present

## 2016-03-12 DIAGNOSIS — D509 Iron deficiency anemia, unspecified: Secondary | ICD-10-CM

## 2016-03-12 DIAGNOSIS — Z96653 Presence of artificial knee joint, bilateral: Secondary | ICD-10-CM | POA: Insufficient documentation

## 2016-03-12 DIAGNOSIS — C50919 Malignant neoplasm of unspecified site of unspecified female breast: Secondary | ICD-10-CM | POA: Diagnosis not present

## 2016-03-12 DIAGNOSIS — Z96659 Presence of unspecified artificial knee joint: Secondary | ICD-10-CM | POA: Insufficient documentation

## 2016-03-12 DIAGNOSIS — Z0000 Encounter for general adult medical examination without abnormal findings: Secondary | ICD-10-CM

## 2016-03-12 DIAGNOSIS — F900 Attention-deficit hyperactivity disorder, predominantly inattentive type: Secondary | ICD-10-CM

## 2016-03-12 DIAGNOSIS — R252 Cramp and spasm: Secondary | ICD-10-CM

## 2016-03-12 DIAGNOSIS — F341 Dysthymic disorder: Secondary | ICD-10-CM | POA: Diagnosis not present

## 2016-03-12 DIAGNOSIS — L819 Disorder of pigmentation, unspecified: Secondary | ICD-10-CM

## 2016-03-12 DIAGNOSIS — Z96651 Presence of right artificial knee joint: Secondary | ICD-10-CM | POA: Diagnosis not present

## 2016-03-12 DIAGNOSIS — T451X5A Adverse effect of antineoplastic and immunosuppressive drugs, initial encounter: Secondary | ICD-10-CM

## 2016-03-12 DIAGNOSIS — R232 Flushing: Secondary | ICD-10-CM | POA: Diagnosis not present

## 2016-03-12 DIAGNOSIS — E039 Hypothyroidism, unspecified: Secondary | ICD-10-CM | POA: Diagnosis not present

## 2016-03-12 DIAGNOSIS — L814 Other melanin hyperpigmentation: Secondary | ICD-10-CM | POA: Insufficient documentation

## 2016-03-12 LAB — CBC WITH DIFFERENTIAL/PLATELET
BASOS ABS: 0 10*3/uL (ref 0.0–0.1)
Basophils Relative: 0 % (ref 0–1)
EOS PCT: 3 % (ref 0–5)
Eosinophils Absolute: 0.3 10*3/uL (ref 0.0–0.7)
HCT: 44.5 % (ref 36.0–46.0)
Hemoglobin: 14.8 g/dL (ref 12.0–15.0)
Lymphocytes Relative: 16 % (ref 12–46)
Lymphs Abs: 1.4 10*3/uL (ref 0.7–4.0)
MCH: 29.5 pg (ref 26.0–34.0)
MCHC: 33.3 g/dL (ref 30.0–36.0)
MCV: 88.6 fL (ref 78.0–100.0)
MONO ABS: 0.8 10*3/uL (ref 0.1–1.0)
MPV: 10.7 fL (ref 8.6–12.4)
Monocytes Relative: 9 % (ref 3–12)
Neutro Abs: 6.1 10*3/uL (ref 1.7–7.7)
Neutrophils Relative %: 72 % (ref 43–77)
Platelets: 210 10*3/uL (ref 150–400)
RBC: 5.02 MIL/uL (ref 3.87–5.11)
RDW: 13.5 % (ref 11.5–15.5)
WBC: 8.5 10*3/uL (ref 4.0–10.5)

## 2016-03-12 LAB — TSH: TSH: 2.22 mIU/L

## 2016-03-12 MED ORDER — TRETINOIN 0.05 % EX CREA: TOPICAL_CREAM | Freq: Every day | CUTANEOUS | Status: DC

## 2016-03-12 MED ORDER — CYCLOBENZAPRINE HCL 10 MG PO TABS: ORAL_TABLET | ORAL | Status: DC

## 2016-03-12 MED ORDER — LISDEXAMFETAMINE DIMESYLATE 70 MG PO CAPS: 70.0000 mg | ORAL_CAPSULE | Freq: Every day | ORAL | Status: DC

## 2016-03-12 MED ORDER — VENLAFAXINE HCL ER 150 MG PO CP24: ORAL_CAPSULE | ORAL | Status: DC

## 2016-03-12 MED ORDER — AZITHROMYCIN 500 MG PO TABS: 500.0000 mg | ORAL_TABLET | Freq: Every day | ORAL | Status: DC

## 2016-03-12 NOTE — Progress Notes (Signed)
Subjective:    Patient ID: Christy [name], female    DOB: [birth year], 60 y.o.   MRN: [id number]  HPI He is here for complete examination. She has a 12 day history of difficulty with fatigue, cough that has become productive, chest congestion, sore throat and initially had difficulty with fever and chills. 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 She has a previous history of difficulty with iron deficiency requiring transfusion, the most recent one was February. She also has a history of vitamin D deficiency and presently is on between 2 and 5000 international units per day. She does have underlying ADD and thinks the medication is not strong enough to get her through the day. She has on occasion tried a booster stimulant which does give some benefit.She complains of difficulty with leg cramps and does use Flexeril intimately for this. She also has hot flashes related to aromatase therapy and is doing well on Effexor. She has a lesion present on her right cheek that she would like Retin-A 4. She also has a history of osteopenia secondary to her vitamin D deficiency. She did have a TKR and is doing quite nicely with this. She continues on her thyroid medication and is having no difficulty with this. She is now married and states that this is going well. 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 She has no other concerns or complaints. HShe does get regular follow-up with her oncologist as she has had 2 primary breast cancers   Review of Systems  All other systems reviewed and are negative.      Objective:   Physical Exam BP 140/100 mmHg  Pulse 105  Ht 5\' 6"  (1.676 m)  Wt 181 lb (82.101 kg)  BMI 29.23 kg/m2  SpO2 96%  General Appearance:    Alert, cooperative, no distress, appears stated age  Head:    Normocephalic, without obvious abnormality, atraumatic  Eyes:    PERRL, conjunctiva/corneas clear, EOM's intact, fundi    benign    Ears:    Normal TM's and external ear canals  Nose:   Nares normal, mucosa normal, no drainage or sinus   tenderness  Throat:   Lips, mucosa, and tongue normal; teeth and gums normal  Neck:   Supple, no lymphadenopathy;  thyroid:  no   enlargement/tenderness/nodules; no carotid   bruit or JVD  Back:    Spine nontender, no curvature, ROM normal, no CVA     tenderness  Lungs:     Show rhonchi to auscultation bilaterally, with normal respirations unlabored  Chest Wall:    No tenderness or deformity   Heart:    Regular rate and rhythm, S1 and S2 normal, no murmur, rub   or gallop  Breast Exam:    Deferred to GYN  Abdomen:     Soft, non-tender, nondistended, normoactive bowel sounds,    no masses, no hepatosplenomegaly  Genitalia:    Deferred to GYN     Extremities:   No clubbing, cyanosis or edema  Pulses:   2+ and symmetric all extremities  Skin:   Skin color, texture, turgor normal, pigmented oval lesion 1 x 0.5 cm noted on the right cheek  Lymph nodes:   Cervical, supraclavicular, and axillary nodes normal  Neurologic:   CNII-XII intact, normal strength, sensation and gait; reflexes 2+ and symmetric throughout          Psych:   Normal mood, affect, hygiene  and grooming.          Assessment & Plan:  Routine general medical examination at a health care facility - Plan: CBC with Differential/Platelet  Acute bronchitis, unspecified organism - Plan: azithromycin (ZITHROMAX) 500 MG tablet  ADD (attention deficit hyperactivity disorder, inattentive type) - Plan: lisdexamfetamine (VYVANSE) 70 MG capsule  Dysthymia - Plan: venlafaxine XR (EFFEXOR-XR) 150 MG 24 hr capsule  Hot flashes related to aromatase inhibitor therapy - Plan: venlafaxine XR (EFFEXOR-XR) 150 MG 24 hr capsule  Breast cancer, unspecified laterality - Plan: cyclobenzaprine (FLEXERIL) 10 MG tablet  Solar lentigo - Plan: tretinoin (RETIN-A) 0.05 % cream  Iron deficiency anemia  Osteopenia - Plan: VITAMIN D 25 Hydroxy  (Vit-D Deficiency, Fractures)  S/P TKR (total knee replacement) using cement, right  Hypothyroidism, unspecified hypothyroidism type - Plan: TSH  Cramps of lower extremity, unspecified laterality she will call if not entirely better from her bronchitis after she has at least a week of the medication. She will increase her Vyvanse to see if that will help with her underlying ADD as she does seem to break through. FNA given for the length ago. She will continue on her other medications she seems to be done quite nicely on. Follow-up concerning the iron deficiency.

## 2016-03-13 LAB — VITAMIN D 25 HYDROXY (VIT D DEFICIENCY, FRACTURES): VIT D 25 HYDROXY: 43 ng/mL (ref 30–100)

## 2016-03-14 ENCOUNTER — Telehealth: Payer: Self-pay | Admitting: Family Medicine

## 2016-03-16 ENCOUNTER — Encounter: Payer: Self-pay | Admitting: Family Medicine

## 2016-03-16 DIAGNOSIS — J209 Acute bronchitis, unspecified: Secondary | ICD-10-CM

## 2016-03-16 MED ORDER — BENZONATATE 100 MG PO CAPS: 200.0000 mg | ORAL_CAPSULE | Freq: Three times a day (TID) | ORAL | Status: DC | PRN

## 2016-03-16 MED ORDER — AZITHROMYCIN 500 MG PO TABS: 500.0000 mg | ORAL_TABLET | Freq: Every day | ORAL | Status: DC

## 2016-03-16 NOTE — Telephone Encounter (Signed)
Lab added

## 2016-03-16 NOTE — Telephone Encounter (Signed)
P.A. Isabelle Course, medication is not covered by her plan.  Called pt & informed to see if she wants to pay out of pocket or switch to something else.  She will let us know.  Also gave her info on Dr. Lanice Shirts message about her refills and wants to know if you were able to add the folate lab

## 2016-03-16 NOTE — Telephone Encounter (Signed)
Check with Denice Paradise to see if it was ordered

## 2016-03-17 LAB — FOLATE: Folate: >24 ng/mL (ref >5.4)

## 2016-03-22 NOTE — Telephone Encounter (Signed)
Pt to call back if she wants to switch

## 2016-04-09 ENCOUNTER — Telehealth: Payer: Self-pay | Admitting: Family Medicine

## 2016-04-09 ENCOUNTER — Telehealth: Payer: Self-pay

## 2016-04-09 DIAGNOSIS — F900 Attention-deficit hyperactivity disorder, predominantly inattentive type: Secondary | ICD-10-CM

## 2016-04-09 MED ORDER — LISDEXAMFETAMINE DIMESYLATE 70 MG PO CAPS: 70.0000 mg | ORAL_CAPSULE | Freq: Every day | ORAL | Status: DC

## 2016-04-09 NOTE — Telephone Encounter (Signed)
Pt called requesting refill on the new rx for vyvanse, said she liked the new dosage. When it is ready she asked it be taped to the back door.

## 2016-04-09 NOTE — Telephone Encounter (Signed)
Called pt & informed Rx Vyvanse x 3 ready for pick up. These were put on back door

## 2016-07-20 ENCOUNTER — Telehealth: Payer: Self-pay | Admitting: Family Medicine

## 2016-07-20 MED ORDER — LISDEXAMFETAMINE DIMESYLATE 70 MG PO CAPS: 70.0000 mg | ORAL_CAPSULE | Freq: Every day | ORAL | 0 refills | Status: DC

## 2016-07-20 MED ORDER — TRIAMCINOLONE ACETONIDE 0.1 % EX CREA: 1.0000 "application " | TOPICAL_CREAM | Freq: Two times a day (BID) | CUTANEOUS | 5 refills | Status: DC

## 2016-07-20 NOTE — Telephone Encounter (Signed)
Pt called and is needing a refill a on her Vyvanse, and is also wanting a stronger dose of her exzema cream Triamcinolone 0.05. She dont think the this is working any more, Pt would like to be called at [phone] (M) and can put on the back door when ready so her husband can pick up for her and the other can be sent in to [place], [place]

## 2016-07-21 ENCOUNTER — Telehealth: Payer: Self-pay

## 2016-07-21 NOTE — Telephone Encounter (Signed)
Pt aware rx at front desk ready for pick up. Christy [name]

## 2016-07-22 ENCOUNTER — Other Ambulatory Visit: Payer: Self-pay | Admitting: Oncology

## 2016-07-22 DIAGNOSIS — C50919 Malignant neoplasm of unspecified site of unspecified female breast: Secondary | ICD-10-CM

## 2016-09-23 ENCOUNTER — Other Ambulatory Visit: Payer: Self-pay | Admitting: Oncology

## 2016-09-23 ENCOUNTER — Telehealth: Payer: Self-pay | Admitting: Oncology

## 2016-09-23 DIAGNOSIS — C50811 Malignant neoplasm of overlapping sites of right female breast: Secondary | ICD-10-CM

## 2016-09-23 NOTE — Telephone Encounter (Signed)
09/28 appointment rescheduled to 10/02 per patient request. Patient will be out of town until Sunday evening.

## 2016-09-24 ENCOUNTER — Other Ambulatory Visit: Payer: PRIVATE HEALTH INSURANCE

## 2016-09-24 ENCOUNTER — Ambulatory Visit: Payer: PRIVATE HEALTH INSURANCE | Admitting: Oncology

## 2016-09-28 ENCOUNTER — Ambulatory Visit (HOSPITAL_BASED_OUTPATIENT_CLINIC_OR_DEPARTMENT_OTHER): Payer: PRIVATE HEALTH INSURANCE | Admitting: Oncology

## 2016-09-28 ENCOUNTER — Other Ambulatory Visit (HOSPITAL_BASED_OUTPATIENT_CLINIC_OR_DEPARTMENT_OTHER): Payer: PRIVATE HEALTH INSURANCE

## 2016-09-28 VITALS — BP 133/77 | HR 82 | Temp 98.0°F | Resp 18 | Ht 66.0 in | Wt 188.1 lb

## 2016-09-28 DIAGNOSIS — Z9884 Bariatric surgery status: Secondary | ICD-10-CM

## 2016-09-28 DIAGNOSIS — Z853 Personal history of malignant neoplasm of breast: Secondary | ICD-10-CM | POA: Diagnosis not present

## 2016-09-28 DIAGNOSIS — R350 Frequency of micturition: Secondary | ICD-10-CM | POA: Diagnosis not present

## 2016-09-28 DIAGNOSIS — E039 Hypothyroidism, unspecified: Secondary | ICD-10-CM | POA: Diagnosis not present

## 2016-09-28 DIAGNOSIS — Z23 Encounter for immunization: Secondary | ICD-10-CM | POA: Diagnosis not present

## 2016-09-28 DIAGNOSIS — D509 Iron deficiency anemia, unspecified: Secondary | ICD-10-CM | POA: Diagnosis not present

## 2016-09-28 DIAGNOSIS — C50811 Malignant neoplasm of overlapping sites of right female breast: Secondary | ICD-10-CM

## 2016-09-28 LAB — URINALYSIS, MICROSCOPIC - CHCC
Bilirubin (Urine): NEGATIVE
Blood: NEGATIVE
GLUCOSE UR CHCC: NEGATIVE mg/dL
KETONES: NEGATIVE mg/dL
Nitrite: NEGATIVE
PROTEIN: NEGATIVE mg/dL
SPECIFIC GRAVITY, URINE: 1.02 (ref 1.003–1.035)
Urobilinogen, UR: 0.2 mg/dL (ref 0.2–1)
pH: 6 (ref 4.6–8.0)

## 2016-09-28 LAB — CBC WITH DIFFERENTIAL/PLATELET
BASO%: 0.8 % (ref 0.0–2.0)
BASOS ABS: 0 10*3/uL (ref 0.0–0.1)
EOS ABS: 0.2 10*3/uL (ref 0.0–0.5)
EOS%: 4.8 % (ref 0.0–7.0)
HCT: 44.2 % (ref 34.8–46.6)
HGB: 14.2 g/dL (ref 11.6–15.9)
LYMPH%: 25.3 % (ref 14.0–49.7)
MCH: 28.9 pg (ref 25.1–34.0)
MCHC: 32.2 g/dL (ref 31.5–36.0)
MCV: 89.8 fL (ref 79.5–101.0)
MONO#: 0.3 10*3/uL (ref 0.1–0.9)
MONO%: 7.1 % (ref 0.0–14.0)
NEUT%: 62 % (ref 38.4–76.8)
NEUTROS ABS: 2.9 10*3/uL (ref 1.5–6.5)
PLATELETS: 161 10*3/uL (ref 145–400)
RBC: 4.92 10*6/uL (ref 3.70–5.45)
RDW: 13.1 % (ref 11.2–14.5)
WBC: 4.7 10*3/uL (ref 3.9–10.3)
lymph#: 1.2 10*3/uL (ref 0.9–3.3)

## 2016-09-28 LAB — COMPREHENSIVE METABOLIC PANEL
ALBUMIN: 3.8 g/dL (ref 3.5–5.0)
ALK PHOS: 126 U/L (ref 40–150)
ALT: 29 U/L (ref 0–55)
ANION GAP: 8 meq/L (ref 3–11)
AST: 26 U/L (ref 5–34)
BILIRUBIN TOTAL: 0.54 mg/dL (ref 0.20–1.20)
BUN: 7.3 mg/dL (ref 7.0–26.0)
CO2: 27 meq/L (ref 22–29)
Calcium: 9.2 mg/dL (ref 8.4–10.4)
Chloride: 106 mEq/L (ref 98–109)
Creatinine: 0.7 mg/dL (ref 0.6–1.1)
Glucose: 101 mg/dl (ref 70–140)
Potassium: 4.4 mEq/L (ref 3.5–5.1)
Sodium: 141 mEq/L (ref 136–145)
TOTAL PROTEIN: 6.8 g/dL (ref 6.4–8.3)

## 2016-09-28 MED ORDER — INFLUENZA VAC SPLIT QUAD 0.5 ML IM SUSY
0.5000 mL | PREFILLED_SYRINGE | Freq: Once | INTRAMUSCULAR | Status: AC
  Administered 2016-09-28: 0.5 mL via INTRAMUSCULAR
  Filled 2016-09-28: qty 0.5

## 2016-09-28 NOTE — Progress Notes (Signed)
OFFICE PROGRESS NOTE   September 30, 2016   Physicians: J.Lalonde, (C.Streck), (J.Tomblin), V.Paul, D.Brodie, P.Toth  INTERVAL HISTORY:  Patient is seen, alone for visit, in yearly follow up of history of 2 primary right breast cancers, on observation since 7 years of Femara completed 10-2014. She has had bilateral mastectomies 2006 with reconstructions. Last breast imaging was prior to the mastectomies.  Christy [name] has no complaints that seem concerning from standpoint of breast cancer history or that treatment. She has no different discomfort or concerns with the reconstructed breasts. She denies SOB, other pain except chronic orthopedic knee symptoms, any bleeding, GI changes. Appetite is good, has trouble staying away from sweets. Energy is good. No recent infectious illness. No different bladder symptoms, PCP requested UA which has been done here today. Remainder of 10 point Review of Systems negative.   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 Magda Paganini just turned 1, in [place].  ONCOLOGIC HISTORY History is of 2 primary right breast cancers, in 1993 and May 2008. The initial right breast cancer was stage 2 with 3 of 41 nodes involved, ER/PR negative and HER-2 not done in 1993. She was treated by Dr.John Terrall Laity on NSABP B25 with adriamycin/cytoxan. The most recent breast cancer 06-2007 was 0.6 cm invasive ductal with 3 nodes negative, ER/PR + and HER2 -. She had right mastectomy and prophylactic left mastectomy , with bilateral reconstructions. She additionally had hysterectomy with BSO. She was on Femara 07-2007 thru 10-2014.  Objective:  Vital signs in last 24 hours:  BP 133/77 (BP Location: Left Arm, Patient Position: Sitting)   Pulse 82   Temp  98 F (36.7 C) (Oral)   Resp 18   Ht '5\' 6"'  (1.676 m)   Wt 188 lb 1.6 oz (85.3 kg)   SpO2 100%   BMI 30.36 kg/m  Weight up 7 lbs compared with 08-2015. Alert, oriented and appropriate, looks comfortable, in good spirits and just delightful as always. Ambulatory without difficulty.    HEENT:PERRL, sclerae not icteric. Oral mucosa moist without lesions, posterior pharynx clear.  Neck supple. No JVD.  Lymphatics:no cervical,supraclavicular, axillary adenopathy Resp: clear to auscultation bilaterally and normal percussion bilaterally Cardio: regular rate and rhythm. No gallop. GI: soft, nontender, not distended, no mass or organomegaly. Normally active bowel sounds. Musculoskeletal/ Extremities: UE/ LE without pitting edema, cords, tenderness Neuro: nonfocal Skin without rash, ecchymosis, petechiae Breasts: bilateral mastectomies with reconstructions no evidence of local recurrence.Marland Kitchen Axillae benign.   Lab Results:  Results for orders placed or performed in visit on 09/28/16  CBC with Differential  Result Value Ref Range   WBC 4.7 3.9 - 10.3 10e3/uL   NEUT# 2.9 1.5 - 6.5 10e3/uL   HGB 14.2 11.6 - 15.9 g/dL   HCT 44.2 34.8 - 46.6 %   Platelets 161 145 - 400 10e3/uL   MCV 89.8 79.5 - 101.0 fL   MCH 28.9 25.1 - 34.0 pg   MCHC 32.2 31.5 - 36.0 g/dL   RBC 4.92 3.70 - 5.45 10e6/uL   RDW 13.1 11.2 - 14.5 %   lymph# 1.2 0.9 - 3.3 10e3/uL   MONO# 0.3 0.1 - 0.9 10e3/uL   Eosinophils Absolute 0.2 0.0 - 0.5 10e3/uL   Basophils Absolute 0.0 0.0 - 0.1 10e3/uL   NEUT% 62.0  38.4 - 76.8 %   LYMPH% 25.3 14.0 - 49.7 %   MONO% 7.1 0.0 - 14.0 %   EOS% 4.8 0.0 - 7.0 %   BASO% 0.8 0.0 - 2.0 %  Comprehensive metabolic panel  Result Value Ref Range   Sodium 141 136 - 145 mEq/L   Potassium 4.4 3.5 - 5.1 mEq/L   Chloride 106 98 - 109 mEq/L   CO2 27 22 - 29 mEq/L   Glucose 101 70 - 140 mg/dl   BUN 7.3 7.0 - 26.0 mg/dL   Creatinine 0.7 0.6 - 1.1 mg/dL   Total Bilirubin 0.54 0.20 - 1.20 mg/dL    Alkaline Phosphatase 126 40 - 150 U/L   AST 26 5 - 34 U/L   ALT 29 0 - 55 U/L   Total Protein 6.8 6.4 - 8.3 g/dL   Albumin 3.8 3.5 - 5.0 g/dL   Calcium 9.2 8.4 - 10.4 mg/dL   Anion Gap 8 3 - 11 mEq/L   EGFR >90 >90 ml/min/1.73 m2  Urinalysis with microscopic  Result Value Ref Range   Glucose Negative Negative mg/dL   Bilirubin (Urine) Negative Negative   Ketones Negative Negative mg/dL   Specific Gravity, Urine 1.020 1.003 - 1.035   Blood Negative Negative   pH 6.0 4.6 - 8.0   Protein Negative Negative- <30 mg/dL   Urobilinogen, UR 0.2 0.2 - 1 mg/dL   Nitrite Negative Negative   Leukocyte Esterase Trace Negative   RBC / HPF 0-2 0 - 2   WBC, UA 3-6 0 - 2   Bacteria, UA Few Negative- Trace   Epithelial Cells Occasional Negative- Few    02-2015 Vit D  43  Studies/Results:  No results found.  Medications: I have reviewed the patient's current medications. Flu vaccine given now  DISCUSSION  I have suggested follow up with [facility], which she would like to do. She would like to keep [facility] appointment in fall, so will request Survivorship visit for fall 2018.  Assessment/Plan:  1.History of 2 primary right breast cancers: on observation since completing 7 years Femara 10-2014, clinically doing well. She will be seen in [facility] in fall 2018. 2.Osteopenia preceeding aromatase inhibitor: Risk factors include aromatase inhibitor, gastric bypass. Encouraged weight bearing exercise. 4.hysterectomy with oophorectomy 2008 5.hypothyroidism on replacement managed by PCP 6.post right total knee arthroplasty 11-2013 7.prophylactic left mastectomy, bilateral breast reconstructions 8. iron deficiency related to gastric bypass:  IV feraheme previously. Hemoglobin excellent today and MCV normal, follow. She may need additional IV iron in future, but does not need this now  9.gastric bypass for weight control 10.laparoscopic appendectomy for acute  appendicitis 03-2015 [email or web address] diffuse hepatic steatosis by CT 03-2015 12. Flu vaccine given 09-28-16  All questions answered and order placed for [facility] for fall 2018. Route PCP including UA today. Time spent 20 min including >50% counseling and coordination of care.    Evlyn Clines, MD   09/30/2016, 1:46 PM

## 2016-09-30 ENCOUNTER — Telehealth: Payer: Self-pay

## 2016-09-30 ENCOUNTER — Encounter: Payer: Self-pay | Admitting: *Deleted

## 2016-09-30 ENCOUNTER — Encounter: Payer: Self-pay | Admitting: Oncology

## 2016-09-30 NOTE — Telephone Encounter (Signed)
Told Christy [name] that her C-met was all WNL.Marland Kitchen

## 2016-11-03 ENCOUNTER — Ambulatory Visit (INDEPENDENT_AMBULATORY_CARE_PROVIDER_SITE_OTHER): Payer: PRIVATE HEALTH INSURANCE | Admitting: Family Medicine

## 2016-11-03 VITALS — BP 126/74 | HR 107 | Wt 186.0 lb

## 2016-11-03 DIAGNOSIS — C50811 Malignant neoplasm of overlapping sites of right female breast: Secondary | ICD-10-CM | POA: Diagnosis not present

## 2016-11-03 DIAGNOSIS — Z23 Encounter for immunization: Secondary | ICD-10-CM

## 2016-11-03 DIAGNOSIS — F900 Attention-deficit hyperactivity disorder, predominantly inattentive type: Secondary | ICD-10-CM

## 2016-11-03 DIAGNOSIS — F341 Dysthymic disorder: Secondary | ICD-10-CM | POA: Diagnosis not present

## 2016-11-03 MED ORDER — LISDEXAMFETAMINE DIMESYLATE 70 MG PO CAPS: 70.0000 mg | ORAL_CAPSULE | Freq: Every day | ORAL | 0 refills | Status: DC

## 2016-11-03 MED ORDER — AMPHETAMINE-DEXTROAMPHETAMINE 5 MG PO TABS: 5.0000 mg | ORAL_TABLET | Freq: Every day | ORAL | 0 refills | Status: DC

## 2016-11-03 NOTE — Progress Notes (Signed)
   Subjective:    Patient ID: Christy [name], female    DOB: 06-Feb-1956, 60 y.o.   MRN: [id number]  HPI She is here for consultation mainly concerning her ADHD. She has a long history of difficulty with this and has done quite well on Vyvanse 70 mg per she is now noting some difficulty with focus especially in the morning when she takes this. The medicine lasts roughly 12 hours. At the end of that timeframe she does become slightly irritable. She states that in the past she was given a short acting Adderall to see if this would help get her started in the morning but she did not remember to take it. She is interested in doing this again. She also has underlying dysthymia and is doing quite nicely on her present Effexor dosing. She is seen yearly by oncology for follow-up on breast cancer. She has had 2 primary cancers and has had mastectomy.   Review of Systems     Objective:   Physical Exam Alert and in no distress otherwise not examined       Assessment & Plan:  Need for shingles vaccine - Plan: Varicella-zoster vaccine subcutaneous  Attention deficit hyperactivity disorder (ADHD), predominantly inattentive type - Plan: amphetamine-dextroamphetamine (ADDERALL) 5 MG tablet, lisdexamfetamine (VYVANSE) 70 MG capsule  Dysthymia  Malignant neoplasm of overlapping sites of right female breast, unspecified estrogen receptor status ([name]) After discussion, I will give her a 5 mg Adderall as well as the Vyvanse. She will take this in the morning and let me know how this works. May also consider further consultation concerning readjustment of her ADD medication.

## 2016-11-04 ENCOUNTER — Other Ambulatory Visit: Payer: Self-pay | Admitting: Family Medicine

## 2016-11-16 ENCOUNTER — Other Ambulatory Visit: Payer: Self-pay | Admitting: Family Medicine

## 2016-11-16 ENCOUNTER — Encounter: Payer: Self-pay | Admitting: Family Medicine

## 2016-11-16 MED ORDER — AMPHETAMINE-DEXTROAMPHETAMINE 15 MG PO TABS: 15.0000 mg | ORAL_TABLET | Freq: Every day | ORAL | 0 refills | Status: DC

## 2016-11-16 NOTE — Progress Notes (Signed)
She emailed stating that she needed 15 mg of Adderall in the morning as a boost for her Vyvanse. Work well and I will therefore write a prescription.

## 2017-02-17 ENCOUNTER — Ambulatory Visit (INDEPENDENT_AMBULATORY_CARE_PROVIDER_SITE_OTHER): Payer: PRIVATE HEALTH INSURANCE | Admitting: Family Medicine

## 2017-02-17 ENCOUNTER — Encounter: Payer: Self-pay | Admitting: Family Medicine

## 2017-02-17 VITALS — BP 122/80 | HR 90 | Temp 97.8°F | Wt 183.0 lb

## 2017-02-17 DIAGNOSIS — F900 Attention-deficit hyperactivity disorder, predominantly inattentive type: Secondary | ICD-10-CM

## 2017-02-17 DIAGNOSIS — J209 Acute bronchitis, unspecified: Secondary | ICD-10-CM | POA: Diagnosis not present

## 2017-02-17 MED ORDER — LISDEXAMFETAMINE DIMESYLATE 70 MG PO CAPS: 70.0000 mg | ORAL_CAPSULE | Freq: Every day | ORAL | 0 refills | Status: DC

## 2017-02-17 MED ORDER — LEVOFLOXACIN 500 MG PO TABS: 500.0000 mg | ORAL_TABLET | Freq: Every day | ORAL | 0 refills | Status: DC

## 2017-02-17 NOTE — Progress Notes (Signed)
Pt called after OV to change script to [facility] in [place]. Called to [facility] per [employer]. Christy [name]

## 2017-02-17 NOTE — Addendum Note (Signed)
Addended by: Arley Phenix L on: 02/17/2017 04:54 PM   Modules accepted: Orders

## 2017-02-17 NOTE — Progress Notes (Signed)
   Subjective:    Patient ID: Christy [name], female    DOB: 1956/10/16, 61 y.o.   MRN: [id number]  HPI 2 weeks ago she was diagnosed with the flu and had difficulty at that time with cough congestion, weakness, fever or chills. She did get much better and try to go back to work last Friday but was still having difficulty with diaphoresis, cough and congestion. No symptoms of gotten much worse. She also needs her Vyvanse renewed. She continues to do quite nicely on that and does occasionally use an Adderall. Parent when she is using Adderall per day much on a daily basis to help get her through her workday.   Review of Systems     Objective:   Physical Exam Alert and in no distress. Tympanic membranes and canals are normal. Pharyngeal area is normal. Neck is supple without adenopathy or thyromegaly. Cardiac exam shows a regular sinus rhythm without murmurs or gallops. Lungs show scattered bibasilar rhonchi.        Assessment & Plan:  Acute bronchitis, unspecified organism - Plan: levofloxacin (LEVAQUIN) 500 MG tablet  Attention deficit hyperactivity disorder (ADHD), predominantly inattentive type - Plan: lisdexamfetamine (VYVANSE) 70 MG capsule She got secondary bacterial infection after the flu. I will also renew her Vyvanse that she continues to do quite nicely on that.

## 2017-03-09 ENCOUNTER — Telehealth: Payer: Self-pay

## 2017-03-09 DIAGNOSIS — F900 Attention-deficit hyperactivity disorder, predominantly inattentive type: Secondary | ICD-10-CM

## 2017-03-09 MED ORDER — AMPHETAMINE-DEXTROAMPHETAMINE 15 MG PO TABS
15.0000 mg | ORAL_TABLET | Freq: Every day | ORAL | 0 refills | Status: DC
Start: 2017-03-09 — End: 2017-07-08

## 2017-03-09 MED ORDER — LISDEXAMFETAMINE DIMESYLATE 70 MG PO CAPS: 70.0000 mg | ORAL_CAPSULE | Freq: Every day | ORAL | 0 refills | Status: DC

## 2017-03-09 NOTE — Telephone Encounter (Signed)
Pt request refill of Adderall 15mg  90 day supply. She will like refill today. [phone]

## 2017-03-09 NOTE — Telephone Encounter (Signed)
Pt notified scripts ready for pick up. She would have her husband pick this up. Victorino December

## 2017-05-14 ENCOUNTER — Other Ambulatory Visit: Payer: Self-pay | Admitting: Family Medicine

## 2017-05-14 DIAGNOSIS — T451X5A Adverse effect of antineoplastic and immunosuppressive drugs, initial encounter: Secondary | ICD-10-CM

## 2017-05-14 DIAGNOSIS — R232 Flushing: Secondary | ICD-10-CM

## 2017-05-14 DIAGNOSIS — F341 Dysthymic disorder: Secondary | ICD-10-CM

## 2017-05-14 NOTE — Telephone Encounter (Signed)
Is this okay to refill? 

## 2017-07-08 ENCOUNTER — Telehealth: Payer: Self-pay

## 2017-07-08 DIAGNOSIS — F900 Attention-deficit hyperactivity disorder, predominantly inattentive type: Secondary | ICD-10-CM

## 2017-07-08 DIAGNOSIS — B001 Herpesviral vesicular dermatitis: Secondary | ICD-10-CM

## 2017-07-08 MED ORDER — CYCLOBENZAPRINE HCL 10 MG PO TABS: ORAL_TABLET | ORAL | 1 refills | Status: DC

## 2017-07-08 MED ORDER — AMPHETAMINE-DEXTROAMPHETAMINE 15 MG PO TABS: 15.0000 mg | ORAL_TABLET | Freq: Every day | ORAL | 0 refills | Status: DC

## 2017-07-08 MED ORDER — LISDEXAMFETAMINE DIMESYLATE 70 MG PO CAPS: 70.0000 mg | ORAL_CAPSULE | Freq: Every day | ORAL | 0 refills | Status: DC

## 2017-07-08 MED ORDER — VALACYCLOVIR HCL 500 MG PO TABS: 1000.0000 mg | ORAL_TABLET | Freq: Every day | ORAL | 0 refills | Status: DC | PRN

## 2017-07-08 NOTE — Telephone Encounter (Signed)
Needs refill of the Adderall, Flexeril, and Valtrex. Pt is in [place] today and would like to pick up today. [phone]. Victorino December

## 2017-09-07 ENCOUNTER — Other Ambulatory Visit: Payer: Self-pay | Admitting: Family Medicine

## 2017-09-30 ENCOUNTER — Encounter: Payer: PRIVATE HEALTH INSURANCE | Admitting: Adult Health

## 2017-10-06 ENCOUNTER — Encounter: Payer: PRIVATE HEALTH INSURANCE | Admitting: Adult Health

## 2017-10-06 NOTE — Progress Notes (Deleted)
CLINIC:  Survivorship   REASON FOR VISIT:  Routine follow-up for history of breast cancer.   BRIEF ONCOLOGIC HISTORY:  Per Dr. Mariana Kaufman note from 09/28/2016: History is of 2 primary right breast cancers, in 1993 and May 2008. The initial right breast cancer was stage 2 with 3 of 41 nodes involved, ER/PR negative and HER-2 not done in 1993. She was treated by Dr.John Terrall Laity on NSABP B25 with adriamycin/cytoxan. The most recent breast cancer 06-2007 was 0.6 cm invasive ductal with 3 nodes negative, ER/PR + and HER2 -. She had right mastectomy and prophylactic left mastectomy , with bilateral reconstructions. She additionally had hysterectomy with BSO. She was on Femara 07-2007 thru 10-2014.   INTERVAL HISTORY:  Ms. San presents to the Survivorship Clinic today for routine follow-up for her history of breast cancer.  Overall, she reports feeling quite well. ***    REVIEW OF SYSTEMS:  Review of Systems - Oncology Breast: Denies any changes, nodularity, pain, swelling or any other concerns in her breasts      PAST MEDICAL/SURGICAL HISTORY:  Past Medical History:  Diagnosis Date  . ADD (attention deficit disorder)   . Allergy    rhinitis  . Anemia   . Breast cancer ([place])   . Hypothyroid   . Internal hemorrhoids   . Obesity   . Renal calculi   . Tubular adenoma of colon    Past Surgical History:  Procedure Laterality Date  . arthroscopic knee Right    x 3  . BREAST LUMPECTOMY Right   . CESAREAN SECTION     x 2  . CHOLECYSTECTOMY    . GASTRIC BYPASS    . LAPAROSCOPIC APPENDECTOMY N/A 04/03/2015   Procedure: APPENDECTOMY LAPAROSCOPIC;  Surgeon: Autumn Messing III, MD;  Location: [facility];  Service: General;  Laterality: N/A;  . LITHOTRIPSY    . MASTECTOMY Bilateral    with lymph node dissection  . TOTAL KNEE ARTHROPLASTY Right 12/18/2013   Procedure: TOTAL KNEE ARTHROPLASTY;  Surgeon: Kerin Salen, MD;  Location[facility] [employer];  Service: Orthopedics;  Laterality: Right;  Marland Kitchen VAGINAL  HYSTERECTOMY       ALLERGIES:  Allergies  Allergen Reactions  . Morphine And Related Swelling    And itching  . Tylenol [Acetaminophen]     Pt instructed NOT to have tylenol after extensive gastric bypass     CURRENT MEDICATIONS:  Outpatient Encounter Prescriptions as of 10/06/2017  Medication Sig  . amphetamine-dextroamphetamine (ADDERALL) 15 MG tablet Take 1 tablet by mouth daily.  . B Complex-C (B-COMPLEX WITH VITAMIN C) tablet Take 1 tablet by mouth daily.  . cyclobenzaprine (FLEXERIL) 10 MG tablet TAKE 1 TABLET BY MOUTH 3 TIMES DAILY AS NEEDED FOR MUSCLE SPASMS  . ibuprofen (ADVIL,MOTRIN) 200 MG tablet You can take 2-3 tablets every 6 hours as needed for pain.  Marland Kitchen levofloxacin (LEVAQUIN) 500 MG tablet Take 1 tablet (500 mg total) by mouth daily.  Marland Kitchen levothyroxine (SYNTHROID, LEVOTHROID) 50 MCG tablet TAKE ONE TABLET BY MOUTH ONCE DAILY  . lisdexamfetamine (VYVANSE) 70 MG capsule Take 1 capsule (70 mg total) by mouth daily.  Marland Kitchen MAGNESIUM OXIDE PO Take 1 tablet by mouth daily.  . Multiple Vitamin (MULTIVITAMIN) capsule Take 3 capsules by mouth daily.   . ranitidine (ZANTAC) 300 MG tablet TAKE 1 TABLET BY MOUTH AT BEDTIME. (Patient taking differently: TAKE 1 TABLET BY MOUTH DAILY AS NEEDED FOR ACID REFLUX)  . triamcinolone cream (KENALOG) 0.1 % Apply 1 application topically 2 (two) times  daily.  . valACYclovir (VALTREX) 500 MG tablet Take 2 tablets (1,000 mg total) by mouth daily as needed.  . venlafaxine XR (EFFEXOR-XR) 150 MG 24 hr capsule TAKE 1 CAPSULE BY MOUTH EVERY DAY WITH BREAKFAST  . VITAMIN D, CHOLECALCIFEROL, PO Take 5,000 Int'l Units by mouth daily.   No facility-administered encounter medications on file as of 10/06/2017.      ONCOLOGIC FAMILY HISTORY:  Family History  Problem Relation Age of Onset  . COPD Father   . Breast cancer Paternal Aunt   . Colon polyps Mother   . Atrial fibrillation Mother   . Colon cancer Neg Hx   . Esophageal cancer Neg Hx   .  Rectal cancer Neg Hx   . Stomach cancer Neg Hx     GENETIC COUNSELING/TESTING: ***  SOCIAL HISTORY:  TISHINA LOWN is /single/married/divorced/widowed/separated and lives alone/with her spouse/family/friend in (city), [place].  She has (#) children and they live in (city).  Ms. Aerts is currently retired/disabled/working part-time/full-time as ***.  She denies any current or history of tobacco, alcohol, or illicit drug use.     PHYSICAL EXAMINATION:  Vital Signs: There were no vitals filed for this visit. There were no vitals filed for this visit. General: Well-nourished, well-appearing female in no acute distress.  Unaccompanied/Accompanied by***** today.   HEENT: Head is normocephalic.  Pupils equal and reactive to light. Conjunctivae clear without exudate.  Sclerae anicteric. Oral mucosa is pink, moist.  Oropharynx is pink without lesions or erythema.  Lymph: No cervical, supraclavicular, or infraclavicular lymphadenopathy noted on palpation.  Cardiovascular: Regular rate and rhythm.Marland Kitchen Respiratory: Clear to auscultation bilaterally. Chest expansion symmetric; breathing non-labored.  Breast Exam:  Breasts: s/p bilateral mastectomies with reconstruction -Axilla: No axillary adenopathy bilaterally.  GI: Abdomen soft and round; non-tender, non-distended. Bowel sounds normoactive. No hepatosplenomegaly.   GU: Deferred.  Neuro: No focal deficits. Steady gait.  Psych: Mood and affect normal and appropriate for situation.  MSK: No focal spinal tenderness to palpation, full range of motion in bilateral upper extremities Extremities: No edema. Skin: Warm and dry.  LABORATORY DATA:  None for this visit***   DIAGNOSTIC IMAGING:  Most recent mammogram:  N/a s/p bilateral mastectomies    ASSESSMENT AND PLAN:  Ms.. Livingstone is a pleasant 61 y.o. female with history of right breast cancers diagnosed in 1993 and 2008.  In 1993, she was ER/PR negative, and HER-2 wasn't evaluated.   She was treated with Adriamycin and Cytoxan.  In 2008, she was diagnosed with stage IA right breast invasive ductal carcinoma, ER+/PR+/HER2-, treated with bilateral mastectomies, TAH/BSO, and Femara for 7 years.  She presents to the Survivorship Clinic for surveillance and routine follow-up.   1. History of breast cancer:  Ms. Laster is currently clinically and radiographically without evidence of disease or recurrence of breast cancer.  I encouraged her to call me with any questions or concerns before her next visit at the cancer center, and I would be happy to see her sooner, if needed.    #. Problem(s) at Visit___________________.  #. Bone health:  Given Ms. Popelka's age, history of breast cancer, and her previous anti-estrogen therapy with Letrozole, she is at risk for bone demineralization. Her last DEXA scan was in 2014 and was consistent with osteopenia with a T score of -2.2 in her left femur.  In the meantime, she was encouraged to increase her consumption of foods rich in calcium, as well as increase her weight-bearing activities.  She was given  education on specific food and activities to promote bone health.  #. Cancer screening:  Due to Ms. Morejon's history and her age, she should receive screening for skin cancers, colon cancer. She was encouraged to follow-up with her PCP for appropriate cancer screenings.   #. Health maintenance and wellness promotion: Ms. Stutsman was encouraged to consume 5-7 servings of fruits and vegetables per day. She was also encouraged to engage in moderate to vigorous exercise for 30 minutes per day most days of the week. She was instructed to limit her alcohol consumption and continue to abstain from tobacco use.    Dispo:  -Return to cancer center in one year for LTS follow up   A total of (30) minutes of face-to-face time was spent with this patient with greater than 50% of that time in counseling and care-coordination.   Gardenia Phlegm, NP Survivorship Program [facility] [phone]   Note: PRIMARY CARE PROVIDER Denita Lung, [place] [phone]

## 2017-10-08 ENCOUNTER — Telehealth: Payer: Self-pay

## 2017-10-08 NOTE — Telephone Encounter (Signed)
Calland spoke with patient concerning upcoming appointments. Per 12/10 sch message

## 2017-10-13 ENCOUNTER — Other Ambulatory Visit: Payer: Self-pay

## 2017-10-13 DIAGNOSIS — C50811 Malignant neoplasm of overlapping sites of right female breast: Secondary | ICD-10-CM

## 2017-10-14 ENCOUNTER — Telehealth: Payer: Self-pay | Admitting: Hematology and Oncology

## 2017-10-14 ENCOUNTER — Other Ambulatory Visit (HOSPITAL_BASED_OUTPATIENT_CLINIC_OR_DEPARTMENT_OTHER): Payer: PRIVATE HEALTH INSURANCE

## 2017-10-14 ENCOUNTER — Ambulatory Visit (HOSPITAL_BASED_OUTPATIENT_CLINIC_OR_DEPARTMENT_OTHER): Payer: PRIVATE HEALTH INSURANCE | Admitting: Hematology and Oncology

## 2017-10-14 VITALS — BP 144/84 | HR 87 | Temp 98.8°F | Resp 16 | Ht 66.0 in | Wt 186.1 lb

## 2017-10-14 DIAGNOSIS — Z853 Personal history of malignant neoplasm of breast: Secondary | ICD-10-CM | POA: Diagnosis not present

## 2017-10-14 DIAGNOSIS — M8588 Other specified disorders of bone density and structure, other site: Secondary | ICD-10-CM

## 2017-10-14 DIAGNOSIS — Z170 Estrogen receptor positive status [ER+]: Secondary | ICD-10-CM | POA: Diagnosis not present

## 2017-10-14 DIAGNOSIS — D509 Iron deficiency anemia, unspecified: Secondary | ICD-10-CM

## 2017-10-14 DIAGNOSIS — C50811 Malignant neoplasm of overlapping sites of right female breast: Secondary | ICD-10-CM

## 2017-10-14 DIAGNOSIS — C50411 Malignant neoplasm of upper-outer quadrant of right female breast: Secondary | ICD-10-CM

## 2017-10-14 LAB — COMPREHENSIVE METABOLIC PANEL
ALT: 21 U/L (ref 0–55)
ANION GAP: 8 meq/L (ref 3–11)
AST: 21 U/L (ref 5–34)
Albumin: 3.5 g/dL (ref 3.5–5.0)
Alkaline Phosphatase: 109 U/L (ref 40–150)
BILIRUBIN TOTAL: 0.39 mg/dL (ref 0.20–1.20)
BUN: 7.3 mg/dL (ref 7.0–26.0)
CHLORIDE: 106 meq/L (ref 98–109)
CO2: 26 meq/L (ref 22–29)
CREATININE: 0.6 mg/dL (ref 0.6–1.1)
Calcium: 8.9 mg/dL (ref 8.4–10.4)
EGFR: >60 mL/min/{1.73_m2} (ref >60)
GLUCOSE: 97 mg/dL (ref 70–140)
Potassium: 4.5 mEq/L (ref 3.5–5.1)
SODIUM: 140 meq/L (ref 136–145)
TOTAL PROTEIN: 6.7 g/dL (ref 6.4–8.3)

## 2017-10-14 LAB — CBC WITH DIFFERENTIAL/PLATELET
BASO%: 1.1 % (ref 0.0–2.0)
Basophils Absolute: 0.1 10*3/uL (ref 0.0–0.1)
EOS%: 5.7 % (ref 0.0–7.0)
Eosinophils Absolute: 0.3 10*3/uL (ref 0.0–0.5)
HCT: 40.7 % (ref 34.8–46.6)
HGB: 13.2 g/dL (ref 11.6–15.9)
LYMPH%: 31.8 % (ref 14.0–49.7)
MCH: 29.6 pg (ref 25.1–34.0)
MCHC: 32.4 g/dL (ref 31.5–36.0)
MCV: 91.3 fL (ref 79.5–101.0)
MONO#: 0.5 10*3/uL (ref 0.1–0.9)
MONO%: 10 % (ref 0.0–14.0)
NEUT%: 51.4 % (ref 38.4–76.8)
NEUTROS ABS: 2.4 10*3/uL (ref 1.5–6.5)
PLATELETS: 171 10*3/uL (ref 145–400)
RBC: 4.46 10*6/uL (ref 3.70–5.45)
RDW: 13.1 % (ref 11.2–14.5)
WBC: 4.7 10*3/uL (ref 3.9–10.3)
lymph#: 1.5 10*3/uL (ref 0.9–3.3)

## 2017-10-14 NOTE — Assessment & Plan Note (Signed)
History of 2 primary right breast cancers: on observation since completing 7 years Femara 10-2014, clinically doing well. She will be seen in Breast Survivorship Clinic in fall 2018. 2.Osteopenia preceeding aromatase inhibitor: Risk factors include aromatase inhibitor, gastric bypass. Encouraged weight bearing exercise. 4.hysterectomy with oophorectomy 2008 5.hypothyroidism on replacement managed by PCP 6.post right total knee arthroplasty 11-2013 7.prophylactic left mastectomy, bilateral breast reconstructions 8. 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

## 2017-10-14 NOTE — Progress Notes (Signed)
Patient Care Team: Denita Lung, MD as PCP - General (Family Medicine)  DIAGNOSIS:  Encounter Diagnoses  Name Primary?  . Malignant neoplasm of overlapping sites of right female breast, unspecified estrogen receptor status ([place]) Yes  . Malignant neoplasm of upper-outer quadrant of right breast in female, estrogen receptor positive ([place])     SUMMARY OF ONCOLOGIC HISTORY:   Malignant neoplasm of upper-outer quadrant of right breast in female, estrogen receptor positive ([place])   1993 Initial Diagnosis    Breast cancer ([place]): right breast, stage 2, 3/41 nodes involved, ER/PR negative, HER-2 not tested.  , treated on NSABP 525 with Adriamycin, Cytoxan      06/2007 Relapse/Recurrence    Right breast cancer.  Bilateral mastectomies with reconstruction: Right: IDC, 0.6cm, grade 1, 3 SLN negative, T1b, N0, ER+(84%), PR+(38%), Ki-67 14%, HER-2 negative.   Left: negative      06/2007 Surgery    TAH/BSO      06/2007 Genetic Testing    Negative genetic testing      07/2007 - 10/2014 Anti-estrogen oral therapy    Letrozole daily       CHIEF COMPLIANT: Surveillance of breast cancer  INTERVAL HISTORY: Christy [name] is a 24 with above-mentioned history of right breast cancer relapse disease. Relapse was diagnosed 2008 and underwent bilateral mastectomies with reconstruction. She also had bilateral salpingo-oophorectomy. She took letrozole for 7 years and discontinued in 2015. She was previously seen Dr. Marko Plume and is here todayto see me to establish oncology care with me. 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 She has been doing quite well without any pain or discomfort.  REVIEW OF SYSTEMS:   Constitutional: Denies fevers, chills or abnormal weight loss Eyes: Denies blurriness of vision Ears, nose, mouth, throat, and face: Denies mucositis or sore throat Respiratory: Denies cough, dyspnea or wheezes Cardiovascular: Denies palpitation, chest  discomfort Gastrointestinal:  Denies nausea, heartburn or change in bowel habits Skin: Denies abnormal skin rashes Lymphatics: Denies new lymphadenopathy or easy bruising Neurological:Denies numbness, tingling or new weaknesses Behavioral/Psych: Mood is stable, no new changes  Extremities: No lower extremity edema Breast:  denies any pain or lumps or nodules in either breasts All other systems were reviewed with the patient and are negative.  I have reviewed the past medical history, past surgical history, social history and family history with the patient and they are unchanged from previous note.  ALLERGIES:  is allergic to levaquin [levofloxacin in d5w]; morphine and related; and tylenol [acetaminophen].  MEDICATIONS:  Current Outpatient Prescriptions  Medication Sig Dispense Refill  . amphetamine-dextroamphetamine (ADDERALL) 15 MG tablet Take 1 tablet by mouth daily. 90 tablet 0  . B Complex-C (B-COMPLEX WITH VITAMIN C) tablet Take 1 tablet by mouth daily.    . cyclobenzaprine (FLEXERIL) 10 MG tablet TAKE 1 TABLET BY MOUTH 3 TIMES DAILY AS NEEDED FOR MUSCLE SPASMS 30 tablet 1  . ibuprofen (ADVIL,MOTRIN) 200 MG tablet You can take 2-3 tablets every 6 hours as needed for pain.    Marland Kitchen levothyroxine (SYNTHROID, LEVOTHROID) 50 MCG tablet TAKE ONE TABLET BY MOUTH ONCE DAILY 30 tablet 0  . lisdexamfetamine (VYVANSE) 70 MG capsule Take 1 capsule (70 mg total) by mouth daily. 90 capsule 0  . MAGNESIUM OXIDE PO Take 1 tablet by mouth daily.    . Multiple Vitamin (MULTIVITAMIN) capsule Take 3 capsules by mouth daily.     . ranitidine (ZANTAC) 300 MG tablet TAKE 1 TABLET BY MOUTH  AT BEDTIME. (Patient taking differently: TAKE 1 TABLET BY MOUTH DAILY AS NEEDED FOR ACID REFLUX) 30 tablet 1  . triamcinolone cream (KENALOG) 0.1 % Apply 1 application topically 2 (two) times daily. 30 g 5  . valACYclovir (VALTREX) 500 MG tablet Take 2 tablets (1,000 mg total) by mouth daily as needed. 30 tablet 0  .  venlafaxine XR (EFFEXOR-XR) 150 MG 24 hr capsule TAKE 1 CAPSULE BY MOUTH EVERY DAY WITH BREAKFAST 90 capsule 2  . VITAMIN D, CHOLECALCIFEROL, PO Take 5,000 Int'l Units by mouth daily.     No current facility-administered medications for this visit.     PHYSICAL EXAMINATION: ECOG PERFORMANCE STATUS: 0 - Asymptomatic  Vitals:   10/14/17 1033  BP: (!) 144/84  Pulse: 87  Resp: 16  Temp: 98.8 F (37.1 C)  SpO2: 97%   Filed Weights   10/14/17 1033  Weight: 186 lb 1.6 oz (84.4 kg)    GENERAL:alert, no distress and comfortable SKIN: skin color, texture, turgor are normal, no rashes or significant lesions EYES: normal, Conjunctiva are pink and non-injected, sclera clear OROPHARYNX:no exudate, no erythema and lips, buccal mucosa, and tongue normal  NECK: supple, thyroid normal size, non-tender, without nodularity LYMPH:  no palpable lymphadenopathy in the cervical, axillary or inguinal LUNGS: clear to auscultation and percussion with normal breathing effort HEART: regular rate & rhythm and no murmurs and no lower extremity edema ABDOMEN:abdomen soft, non-tender and normal bowel sounds MUSCULOSKELETAL:no cyanosis of digits and no clubbing  NEURO: alert & oriented x 3 with fluent speech, no focal motor/sensory deficits EXTREMITIES: No lower extremity edema BREAST: No palpable masses or nodules in either right or left reconstructed breasts. (exam performed in the presence of a chaperone)  LABORATORY DATA:  I have reviewed the data as listed   Chemistry      Component Value Date/Time   NA 140 10/14/2017 1014   K 4.5 10/14/2017 1014   CL 98 04/02/2015 1830   CL 107 03/29/2013 0904   CO2 26 10/14/2017 1014   BUN 7.3 10/14/2017 1014   CREATININE 0.6 10/14/2017 1014      Component Value Date/Time   CALCIUM 8.9 10/14/2017 1014   ALKPHOS 109 10/14/2017 1014   AST 21 10/14/2017 1014   ALT 21 10/14/2017 1014   BILITOT 0.39 10/14/2017 1014       Lab Results  Component Value Date    WBC 4.7 10/14/2017   HGB 13.2 10/14/2017   HCT 40.7 10/14/2017   MCV 91.3 10/14/2017   PLT 171 10/14/2017   NEUTROABS 2.4 10/14/2017    ASSESSMENT & PLAN:  Malignant neoplasm of upper-outer quadrant of right breast in female, estrogen receptor positive ([place]) History of 2 primary right breast cancers: on observation since completing 7 years Femara 10-2014, clinically doing well. She will be seen in Breast Survivorship Clinic in fall 2018. 2.Osteopenia preceeding aromatase inhibitor: Risk factors include aromatase inhibitor, gastric bypass. Encouraged weight bearing exercise. 4.hysterectomy with oophorectomy 2008 5.hypothyroidism on replacement managed by PCP 6.post right total knee arthroplasty 11-2013 7.prophylactic left mastectomy, bilateral breast reconstructions 8. 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   I spent 25 minutes talking to the patient of which more than half was spent in counseling and coordination of care.  Orders Placed This Encounter  Procedures  . Lipid panel    Standing Status:   Future    Standing Expiration Date:   10/14/2018   The  patient has a good understanding of the overall plan. she agrees with it. she will call with any problems that may develop before the next visit here.   Rulon Eisenmenger, MD 10/14/17

## 2017-10-14 NOTE — Telephone Encounter (Signed)
Scheduled appt per 10/18 los. Patient did not want avs or calendar with appts.

## 2017-10-15 ENCOUNTER — Encounter: Payer: Self-pay | Admitting: Hematology and Oncology

## 2017-10-15 LAB — LIPID PANEL
CHOL/HDL RATIO: 2.5 ratio (ref 0.0–4.4)
Cholesterol, Total: 121 mg/dL (ref 100–199)
HDL: 49 mg/dL (ref >39)
LDL CALC: 62 mg/dL (ref 0–99)
TRIGLYCERIDES: 51 mg/dL (ref 0–149)
VLDL CHOLESTEROL CAL: 10 mg/dL (ref 5–40)

## 2017-10-19 ENCOUNTER — Other Ambulatory Visit: Payer: Self-pay | Admitting: Family Medicine

## 2017-10-19 MED ORDER — LEVOTHYROXINE SODIUM 50 MCG PO TABS
ORAL_TABLET | ORAL | 0 refills | Status: DC
Start: 2017-10-19 — End: 2017-10-22

## 2017-10-19 NOTE — Telephone Encounter (Signed)
Pt called and made an appt for Thursday. This is the only day she has off. Please refill med.

## 2017-10-19 NOTE — Addendum Note (Signed)
Addended by: Denita Lung on: 10/19/2017 09:34 AM   Modules accepted: Orders

## 2017-10-21 ENCOUNTER — Encounter: Payer: Self-pay | Admitting: Family Medicine

## 2017-10-21 ENCOUNTER — Ambulatory Visit (INDEPENDENT_AMBULATORY_CARE_PROVIDER_SITE_OTHER): Payer: PRIVATE HEALTH INSURANCE | Admitting: Family Medicine

## 2017-10-21 VITALS — BP 148/100 | HR 88 | Wt 186.4 lb

## 2017-10-21 DIAGNOSIS — Z170 Estrogen receptor positive status [ER+]: Secondary | ICD-10-CM

## 2017-10-21 DIAGNOSIS — F341 Dysthymic disorder: Secondary | ICD-10-CM

## 2017-10-21 DIAGNOSIS — Z96651 Presence of right artificial knee joint: Secondary | ICD-10-CM

## 2017-10-21 DIAGNOSIS — C50411 Malignant neoplasm of upper-outer quadrant of right female breast: Secondary | ICD-10-CM | POA: Diagnosis not present

## 2017-10-21 DIAGNOSIS — D1722 Benign lipomatous neoplasm of skin and subcutaneous tissue of left arm: Secondary | ICD-10-CM

## 2017-10-21 DIAGNOSIS — E039 Hypothyroidism, unspecified: Secondary | ICD-10-CM

## 2017-10-21 DIAGNOSIS — M85859 Other specified disorders of bone density and structure, unspecified thigh: Secondary | ICD-10-CM | POA: Diagnosis not present

## 2017-10-21 DIAGNOSIS — Z23 Encounter for immunization: Secondary | ICD-10-CM | POA: Diagnosis not present

## 2017-10-21 DIAGNOSIS — F900 Attention-deficit hyperactivity disorder, predominantly inattentive type: Secondary | ICD-10-CM

## 2017-10-21 DIAGNOSIS — Z8619 Personal history of other infectious and parasitic diseases: Secondary | ICD-10-CM | POA: Diagnosis not present

## 2017-10-21 LAB — TSH: TSH: 3.49 mIU/L (ref 0.40–4.50)

## 2017-10-21 NOTE — Patient Instructions (Signed)
[name] [phone] 

## 2017-10-21 NOTE — Progress Notes (Addendum)
   Subjective:    Patient ID: Christy [name], female    DOB: 1956-08-11, 61 y.o.   MRN: [id number]  HPI She is here for a follow-up visit. She does need her thyroid checked. She has had difficulty recently with fatigue but does admit to job stress as well as dealing with her aging mother is affecting her psychologically. She has a history of reflux but no recent symptoms. She is followed regularly by her oncologist for her underlying breast cancer diagnosis. She also has a history of osteopenia and has not had a Dexa scanner within the last several years. She does have multiple lipomas one of which is on the left arm that does tend to bother her occasionally when she has to use her computer. She has had a knee replacement has done well with that. She also has a history of herpes labialis and would like a refill on her Valtrex. She continues on Effexor and is doing fine on that. 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   Review of Systems     Objective:   Physical Exam Alert and in no distress. Tympanic membranes and canals are normal. Pharyngeal area is normal. Neck is supple without adenopathy or thyromegaly. Cardiac exam shows a regular sinus rhythm without murmurs or gallops. Lungs are clear to auscultation. A 4 cm round smooth movable lesions noted on the left forearm. 10/22/17 the TSH came back normal. I'll renew her thyroid medicine as well as her ADD meds.      Assessment & Plan:  Attention deficit hyperactivity disorder (ADHD), predominantly inattentive type  Dysthymia  Malignant neoplasm of upper-outer quadrant of right breast in female, estrogen receptor positive ([place])  Status post total right knee replacement using cement  Osteopenia of neck of femur, unspecified laterality - Plan: DG Bone Density  Need for shingles vaccine - Plan: Varicella-zoster vaccine IM (Shingrix)  History of herpes labialis  Hypothyroidism,  unspecified type - Plan: TSH  Lipoma of left upper extremity - Plan: Ambulatory referral to General Surgery Discussed counseling with her and she definitely would like this infected ask for a referral. She will check someone more in her area to cut down on her travel. She will let me know. She will continue be followed by her oncologist. I will renew her ADD medicine after we get back the thyroid to make sure there is no issues need to be addressed there.

## 2017-10-22 MED ORDER — LISDEXAMFETAMINE DIMESYLATE 70 MG PO CAPS: 70.0000 mg | ORAL_CAPSULE | Freq: Every day | ORAL | 0 refills | Status: DC

## 2017-10-22 MED ORDER — LEVOTHYROXINE SODIUM 50 MCG PO TABS
ORAL_TABLET | ORAL | 3 refills | Status: DC
Start: 2017-10-22 — End: 2018-11-10

## 2017-10-22 MED ORDER — AMPHETAMINE-DEXTROAMPHETAMINE 15 MG PO TABS: 15.0000 mg | ORAL_TABLET | Freq: Every day | ORAL | 0 refills | Status: DC

## 2017-10-22 NOTE — Addendum Note (Signed)
Addended by: Denita Lung on: 10/22/2017 09:33 AM   Modules accepted: Orders

## 2017-10-27 ENCOUNTER — Encounter: Payer: PRIVATE HEALTH INSURANCE | Admitting: Nutrition

## 2017-12-16 ENCOUNTER — Encounter: Payer: PRIVATE HEALTH INSURANCE | Admitting: Adult Health

## 2017-12-29 ENCOUNTER — Other Ambulatory Visit: Payer: Self-pay | Admitting: Family Medicine

## 2017-12-29 DIAGNOSIS — B001 Herpesviral vesicular dermatitis: Secondary | ICD-10-CM

## 2017-12-29 NOTE — Telephone Encounter (Signed)
Is this ok to refill?  

## 2017-12-30 ENCOUNTER — Other Ambulatory Visit (INDEPENDENT_AMBULATORY_CARE_PROVIDER_SITE_OTHER): Payer: PRIVATE HEALTH INSURANCE

## 2017-12-30 DIAGNOSIS — Z23 Encounter for immunization: Secondary | ICD-10-CM

## 2018-02-18 DIAGNOSIS — T2101XA Burn of unspecified degree of chest wall, initial encounter: Secondary | ICD-10-CM | POA: Insufficient documentation

## 2018-02-24 ENCOUNTER — Other Ambulatory Visit: Payer: Self-pay | Admitting: Family Medicine

## 2018-02-24 DIAGNOSIS — F900 Attention-deficit hyperactivity disorder, predominantly inattentive type: Secondary | ICD-10-CM

## 2018-02-24 MED ORDER — AMPHETAMINE-DEXTROAMPHETAMINE 15 MG PO TABS: 15.0000 mg | ORAL_TABLET | Freq: Every day | ORAL | 0 refills | Status: DC

## 2018-02-24 NOTE — Telephone Encounter (Signed)
Please advise if this med can be filled at [facility] out [facility]. Thanks [employer]

## 2018-02-25 ENCOUNTER — Telehealth: Payer: Self-pay | Admitting: Family Medicine

## 2018-02-25 NOTE — Telephone Encounter (Signed)
Pt's husband called and requested refill on Adderall. Please send to [facility] outpatient pharmacy.

## 2018-03-24 ENCOUNTER — Other Ambulatory Visit: Payer: Self-pay | Admitting: Family Medicine

## 2018-03-24 DIAGNOSIS — R232 Flushing: Secondary | ICD-10-CM

## 2018-03-24 DIAGNOSIS — F341 Dysthymic disorder: Secondary | ICD-10-CM

## 2018-03-24 DIAGNOSIS — T451X5A Adverse effect of antineoplastic and immunosuppressive drugs, initial encounter: Secondary | ICD-10-CM

## 2018-03-25 NOTE — Telephone Encounter (Signed)
Please advise if effexor can be filled at [facility] out pt. Thanks [employer]

## 2018-04-11 ENCOUNTER — Other Ambulatory Visit: Payer: Self-pay | Admitting: Family Medicine

## 2018-04-11 DIAGNOSIS — F900 Attention-deficit hyperactivity disorder, predominantly inattentive type: Secondary | ICD-10-CM

## 2018-04-11 NOTE — Telephone Encounter (Signed)
[name] out pt is requesting to fill pt vyvanse. Christy [name] Please advise [facility]

## 2018-05-11 ENCOUNTER — Other Ambulatory Visit: Payer: Self-pay | Admitting: Orthopedic Surgery

## 2018-05-31 ENCOUNTER — Encounter (HOSPITAL_COMMUNITY): Payer: Self-pay

## 2018-05-31 NOTE — Pre-Procedure Instructions (Signed)
TAUSHA MILHOAN  05/31/2018      [facility] - [address], [address] [place] [address] Phone: [phone] Fax: [phone]     Your procedure is scheduled on Wednesday June 12.  Report to [facility] Admitting at 10:45 A.M.  Call this number if you have problems the morning of surgery:  [phone]   Remember:  No food or drink after midnight.    Take these medicines the morning of surgery with A SIP OF WATER:   Levothyroxine (synthroid) Venlafaxine (Effexor) Fexofenadine ([place]) if needed Cyclobenzaprine (Flexeril) if needed  DO NOT take Vyvanse or Adderall the day of surgery  7 days prior to surgery STOP taking any Aspirin(unless otherwise instructed by your surgeon), celecoxib (Celebrex), Aleve, Naproxen, Ibuprofen, Motrin, Advil, Goody's, BC's, all herbal medications, fish oil, and all vitamins     Do not wear jewelry, make-up or nail polish.  Do not wear lotions, powders, or perfumes, or deodorant.  Do not shave 48 hours prior to surgery.  Men may shave face and neck.  Do not bring valuables to the hospital.  [facility] is not responsible for any belongings or valuables.  Contacts, dentures or bridgework may not be worn into surgery.  Leave your suitcase in the car.  After surgery it may be brought to your room.  For patients admitted to the hospital, discharge time will be determined by your treatment team.  Patients discharged the day of surgery will not be allowed to drive home.  Special instructions:    [facility]- Preparing For Surgery  Before surgery, you can play an important role. Because skin is not sterile, your skin needs to be as free of germs as possible. You can reduce the number of germs on your skin by washing with CHG (chlorahexidine gluconate) Soap before surgery.  CHG is an antiseptic cleaner which kills germs and bonds with the skin to continue killing germs even  after washing.    Oral Hygiene is also important to reduce your risk of infection.  Remember - BRUSH YOUR TEETH THE MORNING OF SURGERY WITH YOUR REGULAR TOOTHPASTE  Please do not use if you have an allergy to CHG or antibacterial soaps. If your skin becomes reddened/irritated stop using the CHG.  Do not shave (including legs and underarms) for at least 48 hours prior to first CHG shower. It is OK to shave your face.  Please follow these instructions carefully.   1. Shower the NIGHT BEFORE SURGERY and the MORNING OF SURGERY with CHG.   2. If you chose to wash your hair, wash your hair first as usual with your normal shampoo.  3. After you shampoo, rinse your hair and body thoroughly to remove the shampoo.  4. Use CHG as you would any other liquid soap. You can apply CHG directly to the skin and wash gently with a scrungie or a clean washcloth.   5. Apply the CHG Soap to your body ONLY FROM THE NECK DOWN.  Do not use on open wounds or open sores. Avoid contact with your eyes, ears, mouth and genitals (private parts). Wash Face and genitals (private parts)  with your normal soap.  6. Wash thoroughly, paying special attention to the area where your surgery will be performed.  7. Thoroughly rinse your body with warm water from the neck down.  8. DO NOT shower/wash with your normal soap after using and rinsing off the CHG  Soap.  9. Pat yourself dry with a CLEAN TOWEL.  10. Wear CLEAN PAJAMAS to bed the night before surgery, wear comfortable clothes the morning of surgery  11. Place CLEAN SHEETS on your bed the night of your first shower and DO NOT SLEEP WITH PETS.    Day of Surgery:  Do not apply any deodorants/lotions.  Please wear clean clothes to the hospital/surgery center.   Remember to brush your teeth WITH YOUR REGULAR TOOTHPASTE.    Please read over the following fact sheets that you were given. Coughing and Deep Breathing, Total Joint Packet, MRSA Information and Surgical  Site Infection Prevention

## 2018-06-01 ENCOUNTER — Encounter (HOSPITAL_COMMUNITY)
Admission: RE | Admit: 2018-06-01 | Discharge: 2018-06-01 | Disposition: A | Discharge status: Home / Self Care | Payer: PRIVATE HEALTH INSURANCE | Source: Ambulatory Visit | Attending: Orthopedic Surgery | Admitting: Orthopedic Surgery

## 2018-06-01 ENCOUNTER — Other Ambulatory Visit: Payer: Self-pay

## 2018-06-01 ENCOUNTER — Ambulatory Visit (HOSPITAL_COMMUNITY)
Admission: RE | Admit: 2018-06-01 | Discharge: 2018-06-01 | Disposition: A | Discharge status: Home / Self Care | Payer: PRIVATE HEALTH INSURANCE | Source: Ambulatory Visit | Attending: Orthopedic Surgery | Admitting: Orthopedic Surgery

## 2018-06-01 ENCOUNTER — Encounter (HOSPITAL_COMMUNITY): Payer: Self-pay

## 2018-06-01 DIAGNOSIS — Z01818 Encounter for other preprocedural examination: Secondary | ICD-10-CM | POA: Diagnosis not present

## 2018-06-01 DIAGNOSIS — Z0183 Encounter for blood typing: Secondary | ICD-10-CM | POA: Diagnosis not present

## 2018-06-01 DIAGNOSIS — Z01812 Encounter for preprocedural laboratory examination: Secondary | ICD-10-CM | POA: Diagnosis not present

## 2018-06-01 HISTORY — DX: Dyspnea, unspecified: R06.00

## 2018-06-01 HISTORY — DX: Personal history of urinary calculi: Z87.442

## 2018-06-01 HISTORY — DX: Adverse effect of unspecified anesthetic, initial encounter: T41.45XA

## 2018-06-01 HISTORY — DX: Gastro-esophageal reflux disease without esophagitis: K21.9

## 2018-06-01 HISTORY — DX: Personal history of other diseases of the digestive system: Z87.19

## 2018-06-01 LAB — BASIC METABOLIC PANEL
Anion gap: 7 (ref 5–15)
BUN: 17 mg/dL (ref 6–20)
CHLORIDE: 105 mmol/L (ref 101–111)
CO2: 28 mmol/L (ref 22–32)
CREATININE: 0.62 mg/dL (ref 0.44–1.00)
Calcium: 9.2 mg/dL (ref 8.9–10.3)
GFR calc Af Amer: >60 mL/min (ref >60)
GFR calc non Af Amer: >60 mL/min (ref >60)
GLUCOSE: 103 mg/dL — AB (ref 65–99)
POTASSIUM: 4.1 mmol/L (ref 3.5–5.1)
Sodium: 140 mmol/L (ref 135–145)

## 2018-06-01 LAB — CBC WITH DIFFERENTIAL/PLATELET
Abs Immature Granulocytes: 0 10*3/uL (ref 0.0–0.1)
Basophils Absolute: 0.1 10*3/uL (ref 0.0–0.1)
Basophils Relative: 1 %
EOS ABS: 0.3 10*3/uL (ref 0.0–0.7)
EOS PCT: 6 %
HEMATOCRIT: 40 % (ref 36.0–46.0)
HEMOGLOBIN: 12.6 g/dL (ref 12.0–15.0)
Immature Granulocytes: 0 %
LYMPHS ABS: 1.4 10*3/uL (ref 0.7–4.0)
LYMPHS PCT: 33 %
MCH: 27.9 pg (ref 26.0–34.0)
MCHC: 31.5 g/dL (ref 30.0–36.0)
MCV: 88.7 fL (ref 78.0–100.0)
MONO ABS: 0.4 10*3/uL (ref 0.1–1.0)
MONOS PCT: 9 %
Neutro Abs: 2.2 10*3/uL (ref 1.7–7.7)
Neutrophils Relative %: 51 %
Platelets: 159 10*3/uL (ref 150–400)
RBC: 4.51 MIL/uL (ref 3.87–5.11)
RDW: 13.6 % (ref 11.5–15.5)
WBC: 4.3 10*3/uL (ref 4.0–10.5)

## 2018-06-01 LAB — APTT: aPTT: 26 seconds (ref 24–36)

## 2018-06-01 LAB — URINALYSIS, ROUTINE W REFLEX MICROSCOPIC
Bilirubin Urine: NEGATIVE
Glucose, UA: NEGATIVE mg/dL
Hgb urine dipstick: NEGATIVE
KETONES UR: NEGATIVE mg/dL
LEUKOCYTES UA: NEGATIVE
Nitrite: NEGATIVE
PROTEIN: NEGATIVE mg/dL
Specific Gravity, Urine: 1.023 (ref 1.005–1.030)
pH: 5 (ref 5.0–8.0)

## 2018-06-01 LAB — TYPE AND SCREEN
ABO/RH(D): O POS
Antibody Screen: NEGATIVE

## 2018-06-01 LAB — SURGICAL PCR SCREEN
MRSA, PCR: NEGATIVE
Staphylococcus aureus: POSITIVE — AB

## 2018-06-01 LAB — PROTIME-INR
INR: 0.94
Prothrombin Time: 12.5 seconds (ref 11.4–15.2)

## 2018-06-06 ENCOUNTER — Encounter: Payer: Self-pay | Admitting: Family Medicine

## 2018-06-06 ENCOUNTER — Other Ambulatory Visit: Payer: Self-pay | Admitting: Family Medicine

## 2018-06-06 DIAGNOSIS — F900 Attention-deficit hyperactivity disorder, predominantly inattentive type: Secondary | ICD-10-CM

## 2018-06-06 MED ORDER — AMPHETAMINE-DEXTROAMPHETAMINE 15 MG PO TABS: 15.0000 mg | ORAL_TABLET | Freq: Every day | ORAL | 0 refills | Status: DC

## 2018-06-06 NOTE — Telephone Encounter (Signed)
Pt would like a refill on adderall. Please advise [facility]

## 2018-06-06 NOTE — Telephone Encounter (Signed)
done

## 2018-06-07 DIAGNOSIS — M1712 Unilateral primary osteoarthritis, left knee: Secondary | ICD-10-CM | POA: Diagnosis present

## 2018-06-07 MED ORDER — TRANEXAMIC ACID 1000 MG/10ML IV SOLN
1000.0000 mg | INTRAVENOUS | Status: DC
  Filled 2018-06-07: qty 10

## 2018-06-07 MED ORDER — TRANEXAMIC ACID 1000 MG/10ML IV SOLN
2000.0000 mg | INTRAVENOUS | Status: DC
  Filled 2018-06-07: qty 20

## 2018-06-07 MED ORDER — LACTATED RINGERS IV SOLN: INTRAVENOUS | Status: DC

## 2018-06-07 MED ORDER — CEFAZOLIN SODIUM-DEXTROSE 2-4 GM/100ML-% IV SOLN: 2.0000 g | INTRAVENOUS | Status: DC

## 2018-06-07 MED ORDER — BUPIVACAINE LIPOSOME 1.3 % IJ SUSP
20.0000 mL | Freq: Once | INTRAMUSCULAR | Status: DC
  Filled 2018-06-07: qty 20

## 2018-06-07 NOTE — H&P (Signed)
TOTAL KNEE ADMISSION H&P  Patient is being admitted for left total knee arthroplasty.  Subjective:  Chief Complaint:left knee pain.  HPI: Christy [name], 62 y.o. female, has a history of pain and functional disability in the left knee due to arthritis and has failed non-surgical conservative treatments for greater than 12 weeks to includeNSAID's and/or analgesics, corticosteriod injections, flexibility and strengthening excercises, weight reduction as appropriate and activity modification.  Onset of symptoms was gradual, starting several years ago with gradually worsening course since that time. The patient noted no past surgery on the left knee(s).  Patient currently rates pain in the left knee(s) at 10 out of 10 with activity. Patient has night pain, worsening of pain with activity and weight bearing, pain that interferes with activities of daily living, pain with passive range of motion and crepitus.  Patient has evidence of joint space narrowing by imaging studies.  There is no active infection.  Patient Active Problem List   Diagnosis Date Noted  . S/P TKR (total knee replacement) using cement 03/12/2016  . Hypothyroidism 03/12/2016  . Solar lentigo 03/12/2016  . Attention deficit hyperactivity disorder (ADHD), predominantly inattentive type 07/04/2013  . Iron deficiency anemia 04/05/2013  . H/O gastric bypass 03/06/2012  . Malignant neoplasm of upper-outer quadrant of right breast in female, estrogen receptor positive ([place]) 03/04/2012  . Osteopenia 03/04/2012   Past Medical History:  Diagnosis Date  . ADD (attention deficit disorder)   . Allergy    rhinitis  . Anemia   . Breast cancer ([place])   . Complication of anesthesia    gastric bypass high pt reg  ,  acetyl succ  . Dyspnea   . GERD (gastroesophageal reflux disease)   . History of hiatal hernia   . History of kidney stones   . Hypothyroid   . Internal hemorrhoids   . Obesity   . Tubular adenoma of colon     Past  Surgical History:  Procedure Laterality Date  . arthroscopic knee Right    x 3  . BREAST LUMPECTOMY Right   . CESAREAN SECTION     x 2  . CHOLECYSTECTOMY    . GASTRIC BYPASS    . LAPAROSCOPIC APPENDECTOMY N/A 04/03/2015   Procedure: APPENDECTOMY LAPAROSCOPIC;  Surgeon: Autumn Messing III, MD;  Location: [facility];  Service: General;  Laterality: N/A;  . LITHOTRIPSY    . MASTECTOMY Bilateral    with lymph node dissection  bil reconstruction 2009  . TOTAL KNEE ARTHROPLASTY Right 12/18/2013   Procedure: TOTAL KNEE ARTHROPLASTY;  Surgeon: Kerin Salen, MD;  Location: [facility];  Service: Orthopedics;  Laterality: Right;  Marland Kitchen VAGINAL HYSTERECTOMY      No current facility-administered medications for this encounter.    Current Outpatient Medications  Medication Sig Dispense Refill Last Dose  . B Complex-C (B-COMPLEX WITH VITAMIN C) tablet Take 1 tablet by mouth daily.   Taking  . celecoxib (CELEBREX) 200 MG capsule Take 400 mg by mouth daily.     . Cholecalciferol (VITAMIN D3) 5000 units CAPS Take 10,000 Units by mouth daily.     . cyclobenzaprine (FLEXERIL) 10 MG tablet TAKE 1 TABLET BY MOUTH 3 TIMES DAILY AS NEEDED FOR MUSCLE SPASMS (Patient taking differently: Take 5 mg by mouth at bedtime as needed for muscle spasms. ) 30 tablet 1 Taking  . cycloSPORINE (RESTASIS) 0.05 % ophthalmic emulsion Place 2 drops into both eyes daily.     . fexofenadine (ALLEGRA) 180 MG tablet Take 180 mg  by mouth daily as needed for allergies or rhinitis.     Marland Kitchen ibuprofen (ADVIL,MOTRIN) 200 MG tablet You can take 2-3 tablets every 6 hours as needed for pain. (Patient taking differently: Take 400 mg by mouth daily as needed for headache or moderate pain. )   Taking  . levothyroxine (SYNTHROID, LEVOTHROID) 50 MCG tablet TAKE ONE TABLET BY MOUTH ONCE DAILY 90 tablet 3   . MAGNESIUM OXIDE PO Take 1 tablet by mouth daily.   Taking  . Multiple Vitamin (MULTIVITAMIN) capsule Take 3 capsules by mouth daily.    Taking  . ranitidine  (ZANTAC) 300 MG tablet TAKE 1 TABLET BY MOUTH AT BEDTIME. (Patient taking differently: Take 300 mg by mouth at bedtime as needed acid reflux) 30 tablet 1 Not Taking  . triamcinolone cream (KENALOG) 0.1 % Apply 1 application topically 2 (two) times daily. (Patient taking differently: Apply 1 application topically daily as needed (eczema). ) 30 g 5 Taking  . valACYclovir (VALTREX) 500 MG tablet TAKE 2 TABLETS BY MOUTH DAILY AS NEEDED. *NEEDS OFFICE VISIT FOR FURTHER REFILLS* (Patient taking differently: TAKE 2 TABLETS BY MOUTH DAILY AS NEEDED FOR FEVER BLISTERS . *NEEDS OFFICE VISIT FOR FURTHER REFILLS*) 30 tablet 0   . venlafaxine XR (EFFEXOR-XR) 150 MG 24 hr capsule TAKE 1 CAPSULE BY MOUTH EVERY DAY WITH BREAKFAST 90 capsule 3   . VYVANSE 70 MG capsule TAKE 1 CAPSULE BY MOUTH EVERY DAY 90 capsule 0   . amphetamine-dextroamphetamine (ADDERALL) 15 MG tablet Take 1 tablet by mouth daily. 90 tablet 0    Allergies  Allergen Reactions  . Levaquin [Levofloxacin In D5w] Itching  . Morphine And Related Swelling    And itching  . Tylenol [Acetaminophen]     Pt instructed NOT to have tylenol after extensive gastric bypass    Social History   Tobacco Use  . Smoking status: Never Smoker  . Smokeless tobacco: Never Used  Substance Use Topics  . Alcohol use: Yes    Alcohol/week: 0.0 oz    Comment: 1 drink per night.     Family History  Problem Relation Age of Onset  . COPD Father   . Colon polyps Mother   . Atrial fibrillation Mother   . Breast cancer Paternal Aunt   . Colon cancer Neg Hx   . Esophageal cancer Neg Hx   . Rectal cancer Neg Hx   . Stomach cancer Neg Hx      Review of Systems  Constitutional: Positive for diaphoresis.  HENT: Positive for tinnitus.   Eyes: Negative.   Respiratory: Positive for shortness of breath.   Cardiovascular: Negative.   Gastrointestinal: Negative.   Genitourinary: Negative.   Musculoskeletal: Positive for joint pain.  Skin: Negative.    Endo/Heme/Allergies: Bruises/bleeds easily.  Psychiatric/Behavioral: The patient is nervous/anxious and has insomnia.     Objective:  Physical Exam  Constitutional: She is oriented to person, place, and time. She appears well-developed and well-nourished.  HENT:  Head: Normocephalic and atraumatic.  Eyes: Pupils are equal, round, and reactive to light.  Neck: Normal range of motion. Neck supple.  Cardiovascular: Intact distal pulses.  Respiratory: Effort normal.  Musculoskeletal: She exhibits tenderness.  Surgical scar the right knee is well-healed range of motion is 0/115.  The left knee range of motion is 5/125, tender along the medial joint line.  Varus stress exacerbates her pain.  Collateral ligaments are stable.  One plus laxity.  She does have a 1+ effusion.    Neurological:  She is alert and oriented to person, place, and time.  Skin: Skin is warm and dry.  Psychiatric: She has a normal mood and affect. Her behavior is normal. Judgment and thought content normal.    Vital signs in last 24 hours:    Labs:   Estimated body mass index is 30.54 kg/m as calculated from the following:   Height as of 06/01/18: 5' 6.5" (1.689 m).   Weight as of 06/01/18: 87.1 kg (192 lb 1.6 oz).   Imaging Review Plain radiographs demonstrate  AP, lateral and sunrise x-rays of both knees show well-placed well fixed DePuy Sigma RP components on the right.  On the left.  She has 1 mm cartilage remaining to the medial compartment and a few millimeters of lateral subluxation of the tibia beneath the femur.     Preoperative templating of the joint replacement has been completed, documented, and submitted to the Operating Room personnel in order to optimize intra-operative equipment management.   Anticipated LOS equal to or greater than 2 midnights due to - Age 64 and older with one or more of the following:  - Obesity  - Expected need for hospital services (PT, OT, Nursing) required for safe  discharge  - Active co-morbidities: anxiety   Assessment/Plan:  End stage arthritis, left knee   The patient history, physical examination, clinical judgment of the provider and imaging studies are consistent with end stage degenerative joint disease of the left knee(s) and total knee arthroplasty is deemed medically necessary. The treatment options including medical management, injection therapy arthroscopy and arthroplasty were discussed at length. The risks and benefits of total knee arthroplasty were presented and reviewed. The risks due to aseptic loosening, infection, stiffness, patella tracking problems, thromboembolic complications and other imponderables were discussed. The patient acknowledged the explanation, agreed to proceed with the plan and consent was signed. Patient is being admitted for inpatient treatment for surgery, pain control, PT, OT, prophylactic antibiotics, VTE prophylaxis, progressive ambulation and ADL's and discharge planning. The patient is planning to be discharged home with home health services.

## 2018-06-07 NOTE — H&P (View-Only) (Signed)
TOTAL KNEE ADMISSION H&P  Patient is being admitted for left total knee arthroplasty.  Subjective:  Chief Complaint:left knee pain.  HPI: Christy [name], 62 y.o. female, has a history of pain and functional disability in the left knee due to arthritis and has failed non-surgical conservative treatments for greater than 12 weeks to includeNSAID's and/or analgesics, corticosteriod injections, flexibility and strengthening excercises, weight reduction as appropriate and activity modification.  Onset of symptoms was gradual, starting several years ago with gradually worsening course since that time. The patient noted no past surgery on the left knee(s).  Patient currently rates pain in the left knee(s) at 10 out of 10 with activity. Patient has night pain, worsening of pain with activity and weight bearing, pain that interferes with activities of daily living, pain with passive range of motion and crepitus.  Patient has evidence of joint space narrowing by imaging studies.  There is no active infection.  Patient Active Problem List   Diagnosis Date Noted  . S/P TKR (total knee replacement) using cement 03/12/2016  . Hypothyroidism 03/12/2016  . Solar lentigo 03/12/2016  . Attention deficit hyperactivity disorder (ADHD), predominantly inattentive type 07/04/2013  . Iron deficiency anemia 04/05/2013  . H/O gastric bypass 03/06/2012  . Malignant neoplasm of upper-outer quadrant of right breast in female, estrogen receptor positive ([place]) 03/04/2012  . Osteopenia 03/04/2012   Past Medical History:  Diagnosis Date  . ADD (attention deficit disorder)   . Allergy    rhinitis  . Anemia   . Breast cancer ([place])   . Complication of anesthesia    gastric bypass high pt reg  ,  acetyl succ  . Dyspnea   . GERD (gastroesophageal reflux disease)   . History of hiatal hernia   . History of kidney stones   . Hypothyroid   . Internal hemorrhoids   . Obesity   . Tubular adenoma of colon     Past  Surgical History:  Procedure Laterality Date  . arthroscopic knee Right    x 3  . BREAST LUMPECTOMY Right   . CESAREAN SECTION     x 2  . CHOLECYSTECTOMY    . GASTRIC BYPASS    . LAPAROSCOPIC APPENDECTOMY N/A 04/03/2015   Procedure: APPENDECTOMY LAPAROSCOPIC;  Surgeon: Autumn Messing III, MD;  Location: [facility];  Service: General;  Laterality: N/A;  . LITHOTRIPSY    . MASTECTOMY Bilateral    with lymph node dissection  bil reconstruction 2009  . TOTAL KNEE ARTHROPLASTY Right 12/18/2013   Procedure: TOTAL KNEE ARTHROPLASTY;  Surgeon: Kerin Salen, MD;  Location: [facility];  Service: Orthopedics;  Laterality: Right;  Marland Kitchen VAGINAL HYSTERECTOMY      No current facility-administered medications for this encounter.    Current Outpatient Medications  Medication Sig Dispense Refill Last Dose  . B Complex-C (B-COMPLEX WITH VITAMIN C) tablet Take 1 tablet by mouth daily.   Taking  . celecoxib (CELEBREX) 200 MG capsule Take 400 mg by mouth daily.     . Cholecalciferol (VITAMIN D3) 5000 units CAPS Take 10,000 Units by mouth daily.     . cyclobenzaprine (FLEXERIL) 10 MG tablet TAKE 1 TABLET BY MOUTH 3 TIMES DAILY AS NEEDED FOR MUSCLE SPASMS (Patient taking differently: Take 5 mg by mouth at bedtime as needed for muscle spasms. ) 30 tablet 1 Taking  . cycloSPORINE (RESTASIS) 0.05 % ophthalmic emulsion Place 2 drops into both eyes daily.     . fexofenadine (ALLEGRA) 180 MG tablet Take 180 mg  by mouth daily as needed for allergies or rhinitis.     Marland Kitchen ibuprofen (ADVIL,MOTRIN) 200 MG tablet You can take 2-3 tablets every 6 hours as needed for pain. (Patient taking differently: Take 400 mg by mouth daily as needed for headache or moderate pain. )   Taking  . levothyroxine (SYNTHROID, LEVOTHROID) 50 MCG tablet TAKE ONE TABLET BY MOUTH ONCE DAILY 90 tablet 3   . MAGNESIUM OXIDE PO Take 1 tablet by mouth daily.   Taking  . Multiple Vitamin (MULTIVITAMIN) capsule Take 3 capsules by mouth daily.    Taking  . ranitidine  (ZANTAC) 300 MG tablet TAKE 1 TABLET BY MOUTH AT BEDTIME. (Patient taking differently: Take 300 mg by mouth at bedtime as needed acid reflux) 30 tablet 1 Not Taking  . triamcinolone cream (KENALOG) 0.1 % Apply 1 application topically 2 (two) times daily. (Patient taking differently: Apply 1 application topically daily as needed (eczema). ) 30 g 5 Taking  . valACYclovir (VALTREX) 500 MG tablet TAKE 2 TABLETS BY MOUTH DAILY AS NEEDED. *NEEDS OFFICE VISIT FOR FURTHER REFILLS* (Patient taking differently: TAKE 2 TABLETS BY MOUTH DAILY AS NEEDED FOR FEVER BLISTERS . *NEEDS OFFICE VISIT FOR FURTHER REFILLS*) 30 tablet 0   . venlafaxine XR (EFFEXOR-XR) 150 MG 24 hr capsule TAKE 1 CAPSULE BY MOUTH EVERY DAY WITH BREAKFAST 90 capsule 3   . VYVANSE 70 MG capsule TAKE 1 CAPSULE BY MOUTH EVERY DAY 90 capsule 0   . amphetamine-dextroamphetamine (ADDERALL) 15 MG tablet Take 1 tablet by mouth daily. 90 tablet 0    Allergies  Allergen Reactions  . Levaquin [Levofloxacin In D5w] Itching  . Morphine And Related Swelling    And itching  . Tylenol [Acetaminophen]     Pt instructed NOT to have tylenol after extensive gastric bypass    Social History   Tobacco Use  . Smoking status: Never Smoker  . Smokeless tobacco: Never Used  Substance Use Topics  . Alcohol use: Yes    Alcohol/week: 0.0 oz    Comment: 1 drink per night.     Family History  Problem Relation Age of Onset  . COPD Father   . Colon polyps Mother   . Atrial fibrillation Mother   . Breast cancer Paternal Aunt   . Colon cancer Neg Hx   . Esophageal cancer Neg Hx   . Rectal cancer Neg Hx   . Stomach cancer Neg Hx      Review of Systems  Constitutional: Positive for diaphoresis.  HENT: Positive for tinnitus.   Eyes: Negative.   Respiratory: Positive for shortness of breath.   Cardiovascular: Negative.   Gastrointestinal: Negative.   Genitourinary: Negative.   Musculoskeletal: Positive for joint pain.  Skin: Negative.    Endo/Heme/Allergies: Bruises/bleeds easily.  Psychiatric/Behavioral: The patient is nervous/anxious and has insomnia.     Objective:  Physical Exam  Constitutional: She is oriented to person, place, and time. She appears well-developed and well-nourished.  HENT:  Head: Normocephalic and atraumatic.  Eyes: Pupils are equal, round, and reactive to light.  Neck: Normal range of motion. Neck supple.  Cardiovascular: Intact distal pulses.  Respiratory: Effort normal.  Musculoskeletal: She exhibits tenderness.  Surgical scar the right knee is well-healed range of motion is 0/115.  The left knee range of motion is 5/125, tender along the medial joint line.  Varus stress exacerbates her pain.  Collateral ligaments are stable.  One plus laxity.  She does have a 1+ effusion.    Neurological:  She is alert and oriented to person, place, and time.  Skin: Skin is warm and dry.  Psychiatric: She has a normal mood and affect. Her behavior is normal. Judgment and thought content normal.    Vital signs in last 24 hours:    Labs:   Estimated body mass index is 30.54 kg/m as calculated from the following:   Height as of 06/01/18: 5' 6.5" (1.689 m).   Weight as of 06/01/18: 87.1 kg (192 lb 1.6 oz).   Imaging Review Plain radiographs demonstrate  AP, lateral and sunrise x-rays of both knees show well-placed well fixed DePuy Sigma RP components on the right.  On the left.  She has 1 mm cartilage remaining to the medial compartment and a few millimeters of lateral subluxation of the tibia beneath the femur.     Preoperative templating of the joint replacement has been completed, documented, and submitted to the Operating Room personnel in order to optimize intra-operative equipment management.   Anticipated LOS equal to or greater than 2 midnights due to - Age 20 and older with one or more of the following:  - Obesity  - Expected need for hospital services (PT, OT, Nursing) required for safe  discharge  - Active co-morbidities: anxiety   Assessment/Plan:  End stage arthritis, left knee   The patient history, physical examination, clinical judgment of the provider and imaging studies are consistent with end stage degenerative joint disease of the left knee(s) and total knee arthroplasty is deemed medically necessary. The treatment options including medical management, injection therapy arthroscopy and arthroplasty were discussed at length. The risks and benefits of total knee arthroplasty were presented and reviewed. The risks due to aseptic loosening, infection, stiffness, patella tracking problems, thromboembolic complications and other imponderables were discussed. The patient acknowledged the explanation, agreed to proceed with the plan and consent was signed. Patient is being admitted for inpatient treatment for surgery, pain control, PT, OT, prophylactic antibiotics, VTE prophylaxis, progressive ambulation and ADL's and discharge planning. The patient is planning to be discharged home with home health services.

## 2018-06-08 ENCOUNTER — Encounter (HOSPITAL_COMMUNITY): Admission: RE | Payer: Self-pay | Source: Ambulatory Visit

## 2018-06-08 ENCOUNTER — Ambulatory Visit (HOSPITAL_COMMUNITY)
Admission: RE | Admit: 2018-06-08 | Payer: PRIVATE HEALTH INSURANCE | Source: Ambulatory Visit | Admitting: Orthopedic Surgery

## 2018-06-08 ENCOUNTER — Other Ambulatory Visit: Payer: Self-pay | Admitting: Orthopedic Surgery

## 2018-06-08 SURGERY — ARTHROPLASTY, KNEE, TOTAL
Anesthesia: Spinal | Laterality: Left

## 2018-06-16 ENCOUNTER — Ambulatory Visit: Payer: PRIVATE HEALTH INSURANCE | Admitting: Physical Therapy

## 2018-06-28 ENCOUNTER — Other Ambulatory Visit: Payer: Self-pay | Admitting: Orthopedic Surgery

## 2018-06-28 ENCOUNTER — Encounter (HOSPITAL_COMMUNITY): Payer: Self-pay | Admitting: *Deleted

## 2018-06-28 ENCOUNTER — Other Ambulatory Visit: Payer: Self-pay

## 2018-06-29 ENCOUNTER — Ambulatory Visit (HOSPITAL_COMMUNITY): Payer: PRIVATE HEALTH INSURANCE | Admitting: Anesthesiology

## 2018-06-29 ENCOUNTER — Encounter (HOSPITAL_COMMUNITY): Payer: Self-pay | Admitting: *Deleted

## 2018-06-29 ENCOUNTER — Inpatient Hospital Stay (HOSPITAL_COMMUNITY)
Admission: AD | Admit: 2018-06-29 | Discharge: 2018-06-30 | DRG: 470 | Disposition: A | Discharge status: Home / Self Care | Payer: PRIVATE HEALTH INSURANCE | Attending: Orthopedic Surgery | Admitting: Orthopedic Surgery

## 2018-06-29 ENCOUNTER — Encounter (HOSPITAL_COMMUNITY)
Admission: AD | Disposition: A | Discharge status: Home / Self Care | Payer: Self-pay | Source: Home / Self Care | Attending: Orthopedic Surgery

## 2018-06-29 DIAGNOSIS — Z9013 Acquired absence of bilateral breasts and nipples: Secondary | ICD-10-CM

## 2018-06-29 DIAGNOSIS — Z885 Allergy status to narcotic agent status: Secondary | ICD-10-CM

## 2018-06-29 DIAGNOSIS — Z803 Family history of malignant neoplasm of breast: Secondary | ICD-10-CM | POA: Diagnosis not present

## 2018-06-29 DIAGNOSIS — M25562 Pain in left knee: Secondary | ICD-10-CM | POA: Diagnosis present

## 2018-06-29 DIAGNOSIS — K219 Gastro-esophageal reflux disease without esophagitis: Secondary | ICD-10-CM | POA: Diagnosis present

## 2018-06-29 DIAGNOSIS — Z79899 Other long term (current) drug therapy: Secondary | ICD-10-CM

## 2018-06-29 DIAGNOSIS — M1712 Unilateral primary osteoarthritis, left knee: Principal | ICD-10-CM | POA: Diagnosis present

## 2018-06-29 DIAGNOSIS — F988 Other specified behavioral and emotional disorders with onset usually occurring in childhood and adolescence: Secondary | ICD-10-CM | POA: Diagnosis present

## 2018-06-29 DIAGNOSIS — Z853 Personal history of malignant neoplasm of breast: Secondary | ICD-10-CM | POA: Diagnosis not present

## 2018-06-29 DIAGNOSIS — D62 Acute posthemorrhagic anemia: Secondary | ICD-10-CM | POA: Diagnosis not present

## 2018-06-29 DIAGNOSIS — E039 Hypothyroidism, unspecified: Secondary | ICD-10-CM | POA: Diagnosis present

## 2018-06-29 DIAGNOSIS — Z79890 Hormone replacement therapy (postmenopausal): Secondary | ICD-10-CM

## 2018-06-29 DIAGNOSIS — Z90710 Acquired absence of both cervix and uterus: Secondary | ICD-10-CM | POA: Diagnosis not present

## 2018-06-29 DIAGNOSIS — E669 Obesity, unspecified: Secondary | ICD-10-CM | POA: Diagnosis present

## 2018-06-29 DIAGNOSIS — Z96651 Presence of right artificial knee joint: Secondary | ICD-10-CM | POA: Diagnosis present

## 2018-06-29 DIAGNOSIS — Z881 Allergy status to other antibiotic agents status: Secondary | ICD-10-CM

## 2018-06-29 DIAGNOSIS — Z6830 Body mass index (BMI) 30.0-30.9, adult: Secondary | ICD-10-CM

## 2018-06-29 DIAGNOSIS — Z9884 Bariatric surgery status: Secondary | ICD-10-CM | POA: Diagnosis not present

## 2018-06-29 DIAGNOSIS — M25762 Osteophyte, left knee: Secondary | ICD-10-CM | POA: Diagnosis present

## 2018-06-29 DIAGNOSIS — Z886 Allergy status to analgesic agent status: Secondary | ICD-10-CM

## 2018-06-29 HISTORY — DX: Unspecified osteoarthritis, unspecified site: M19.90

## 2018-06-29 HISTORY — PX: TOTAL KNEE ARTHROPLASTY: SHX125

## 2018-06-29 HISTORY — DX: Pneumonia, unspecified organism: J18.9

## 2018-06-29 LAB — CBC WITH DIFFERENTIAL/PLATELET
ABS IMMATURE GRANULOCYTES: 0 10*3/uL (ref 0.0–0.1)
BASOS PCT: 1 %
Basophils Absolute: 0.1 10*3/uL (ref 0.0–0.1)
Eosinophils Absolute: 0.2 10*3/uL (ref 0.0–0.7)
Eosinophils Relative: 6 %
HCT: 40.7 % (ref 36.0–46.0)
Hemoglobin: 12.6 g/dL (ref 12.0–15.0)
IMMATURE GRANULOCYTES: 0 %
Lymphocytes Relative: 31 %
Lymphs Abs: 1.3 10*3/uL (ref 0.7–4.0)
MCH: 27.8 pg (ref 26.0–34.0)
MCHC: 31 g/dL (ref 30.0–36.0)
MCV: 89.6 fL (ref 78.0–100.0)
MONOS PCT: 10 %
Monocytes Absolute: 0.4 10*3/uL (ref 0.1–1.0)
NEUTROS ABS: 2.2 10*3/uL (ref 1.7–7.7)
NEUTROS PCT: 52 %
PLATELETS: 151 10*3/uL (ref 150–400)
RBC: 4.54 MIL/uL (ref 3.87–5.11)
RDW: 13.6 % (ref 11.5–15.5)
WBC: 4.2 10*3/uL (ref 4.0–10.5)

## 2018-06-29 LAB — URINALYSIS, ROUTINE W REFLEX MICROSCOPIC
BACTERIA UA: NONE SEEN
BILIRUBIN URINE: NEGATIVE
Glucose, UA: NEGATIVE mg/dL
Hgb urine dipstick: NEGATIVE
Ketones, ur: NEGATIVE mg/dL
NITRITE: NEGATIVE
PH: 5 (ref 5.0–8.0)
Protein, ur: NEGATIVE mg/dL
SPECIFIC GRAVITY, URINE: 1.012 (ref 1.005–1.030)

## 2018-06-29 LAB — BASIC METABOLIC PANEL
ANION GAP: 10 (ref 5–15)
BUN: 12 mg/dL (ref 8–23)
CALCIUM: 9 mg/dL (ref 8.9–10.3)
CHLORIDE: 107 mmol/L (ref 98–111)
CO2: 26 mmol/L (ref 22–32)
Creatinine, Ser: 0.65 mg/dL (ref 0.44–1.00)
GFR calc non Af Amer: >60 mL/min (ref >60)
Glucose, Bld: 89 mg/dL (ref 70–99)
Potassium: 4 mmol/L (ref 3.5–5.1)
Sodium: 143 mmol/L (ref 135–145)

## 2018-06-29 LAB — PROTIME-INR
INR: 0.98
Prothrombin Time: 12.9 seconds (ref 11.4–15.2)

## 2018-06-29 LAB — APTT: aPTT: 27 seconds (ref 24–36)

## 2018-06-29 LAB — TYPE AND SCREEN
ABO/RH(D): O POS
ANTIBODY SCREEN: NEGATIVE

## 2018-06-29 SURGERY — ARTHROPLASTY, KNEE, TOTAL
Anesthesia: Regional | Site: Knee | Laterality: Left

## 2018-06-29 MED ORDER — LIDOCAINE 2% (20 MG/ML) 5 ML SYRINGE
INTRAMUSCULAR | Status: AC
  Filled 2018-06-29: qty 5

## 2018-06-29 MED ORDER — METOCLOPRAMIDE HCL 5 MG/ML IJ SOLN: 5.0000 mg | Freq: Three times a day (TID) | INTRAMUSCULAR | Status: DC | PRN

## 2018-06-29 MED ORDER — MIDAZOLAM HCL 2 MG/2ML IJ SOLN
INTRAMUSCULAR | Status: AC
  Filled 2018-06-29: qty 2

## 2018-06-29 MED ORDER — BUPIVACAINE LIPOSOME 1.3 % IJ SUSP
20.0000 mL | Freq: Once | INTRAMUSCULAR | Status: DC
  Filled 2018-06-29: qty 20

## 2018-06-29 MED ORDER — DIPHENHYDRAMINE HCL 12.5 MG/5ML PO ELIX: 12.5000 mg | ORAL_SOLUTION | ORAL | Status: DC | PRN

## 2018-06-29 MED ORDER — LORATADINE 10 MG PO TABS
10.0000 mg | ORAL_TABLET | Freq: Every day | ORAL | Status: DC
  Administered 2018-06-29 – 2018-06-30 (×2): 10 mg via ORAL
  Filled 2018-06-29 (×2): qty 1

## 2018-06-29 MED ORDER — ONDANSETRON HCL 4 MG/2ML IJ SOLN
INTRAMUSCULAR | Status: DC | PRN
  Administered 2018-06-29: 4 mg via INTRAVENOUS

## 2018-06-29 MED ORDER — OXYCODONE HCL 5 MG PO TABS: 5.0000 mg | ORAL_TABLET | ORAL | 0 refills | Status: DC | PRN

## 2018-06-29 MED ORDER — BUPIVACAINE-EPINEPHRINE (PF) 0.25% -1:200000 IJ SOLN
INTRAMUSCULAR | Status: DC | PRN
  Administered 2018-06-29: 50 mL

## 2018-06-29 MED ORDER — BUPIVACAINE LIPOSOME 1.3 % IJ SUSP
INTRAMUSCULAR | Status: DC | PRN
  Administered 2018-06-29: 20 mL

## 2018-06-29 MED ORDER — DEXAMETHASONE SODIUM PHOSPHATE 10 MG/ML IJ SOLN
10.0000 mg | Freq: Once | INTRAMUSCULAR | Status: AC
  Administered 2018-06-30: 10 mg via INTRAVENOUS
  Filled 2018-06-29: qty 1

## 2018-06-29 MED ORDER — PHENYLEPHRINE 40 MCG/ML (10ML) SYRINGE FOR IV PUSH (FOR BLOOD PRESSURE SUPPORT)
PREFILLED_SYRINGE | INTRAVENOUS | Status: AC
  Filled 2018-06-29: qty 10

## 2018-06-29 MED ORDER — BUPIVACAINE-EPINEPHRINE 0.25% -1:200000 IJ SOLN
INTRAMUSCULAR | Status: AC
  Filled 2018-06-29: qty 1

## 2018-06-29 MED ORDER — GABAPENTIN 300 MG PO CAPS
300.0000 mg | ORAL_CAPSULE | Freq: Three times a day (TID) | ORAL | Status: DC
  Administered 2018-06-29 – 2018-06-30 (×3): 300 mg via ORAL
  Filled 2018-06-29 (×3): qty 1

## 2018-06-29 MED ORDER — METHOCARBAMOL 500 MG PO TABS
500.0000 mg | ORAL_TABLET | Freq: Four times a day (QID) | ORAL | Status: DC | PRN
  Administered 2018-06-29 – 2018-06-30 (×2): 500 mg via ORAL
  Filled 2018-06-29 (×2): qty 1

## 2018-06-29 MED ORDER — SODIUM CHLORIDE 0.9 % IJ SOLN
INTRAMUSCULAR | Status: DC | PRN
  Administered 2018-06-29: 50 mL

## 2018-06-29 MED ORDER — VENLAFAXINE HCL ER 150 MG PO CP24
150.0000 mg | ORAL_CAPSULE | Freq: Every day | ORAL | Status: DC
  Administered 2018-06-30: 150 mg via ORAL
  Filled 2018-06-29: qty 1

## 2018-06-29 MED ORDER — MIDAZOLAM HCL 2 MG/2ML IJ SOLN
INTRAMUSCULAR | Status: AC
  Administered 2018-06-29: 2 mg
  Filled 2018-06-29: qty 2

## 2018-06-29 MED ORDER — ALUM & MAG HYDROXIDE-SIMETH 200-200-20 MG/5ML PO SUSP: 30.0000 mL | ORAL | Status: DC | PRN

## 2018-06-29 MED ORDER — SODIUM CHLORIDE 0.9 % IV SOLN
1000.0000 mg | INTRAVENOUS | Status: AC
  Administered 2018-06-29: 1000 mg via INTRAVENOUS
  Filled 2018-06-29: qty 1100

## 2018-06-29 MED ORDER — FLEET ENEMA 7-19 GM/118ML RE ENEM: 1.0000 | ENEMA | Freq: Once | RECTAL | Status: DC | PRN

## 2018-06-29 MED ORDER — FENTANYL CITRATE (PF) 100 MCG/2ML IJ SOLN
INTRAMUSCULAR | Status: DC | PRN
  Administered 2018-06-29: 50 ug via INTRAVENOUS

## 2018-06-29 MED ORDER — PROPOFOL 10 MG/ML IV BOLUS
INTRAVENOUS | Status: DC | PRN
  Administered 2018-06-29: 20 mg via INTRAVENOUS

## 2018-06-29 MED ORDER — KCL IN DEXTROSE-NACL 20-5-0.45 MEQ/L-%-% IV SOLN
INTRAVENOUS | Status: DC
  Administered 2018-06-29: 19:00:00 via INTRAVENOUS
  Filled 2018-06-29: qty 1000

## 2018-06-29 MED ORDER — CHLORHEXIDINE GLUCONATE 4 % EX LIQD: 60.0000 mL | Freq: Once | CUTANEOUS | Status: DC

## 2018-06-29 MED ORDER — BISACODYL 5 MG PO TBEC: 5.0000 mg | DELAYED_RELEASE_TABLET | Freq: Every day | ORAL | Status: DC | PRN

## 2018-06-29 MED ORDER — PHENOL 1.4 % MT LIQD: 1.0000 | OROMUCOSAL | Status: DC | PRN

## 2018-06-29 MED ORDER — METOCLOPRAMIDE HCL 5 MG PO TABS: 5.0000 mg | ORAL_TABLET | Freq: Three times a day (TID) | ORAL | Status: DC | PRN

## 2018-06-29 MED ORDER — ASPIRIN 81 MG PO CHEW
81.0000 mg | CHEWABLE_TABLET | Freq: Two times a day (BID) | ORAL | Status: DC
  Administered 2018-06-29 – 2018-06-30 (×2): 81 mg via ORAL
  Filled 2018-06-29 (×2): qty 1

## 2018-06-29 MED ORDER — CYCLOSPORINE 0.05 % OP EMUL
2.0000 [drp] | Freq: Every day | OPHTHALMIC | Status: DC
  Administered 2018-06-29 – 2018-06-30 (×2): 2 [drp] via OPHTHALMIC
  Filled 2018-06-29 (×2): qty 1

## 2018-06-29 MED ORDER — HYDROMORPHONE HCL 1 MG/ML IJ SOLN
0.5000 mg | INTRAMUSCULAR | Status: DC | PRN
  Administered 2018-06-29 – 2018-06-30 (×2): 1 mg via INTRAVENOUS
  Filled 2018-06-29 (×2): qty 1

## 2018-06-29 MED ORDER — CEFAZOLIN SODIUM-DEXTROSE 2-4 GM/100ML-% IV SOLN
2.0000 g | INTRAVENOUS | Status: AC
  Administered 2018-06-29: 2 g via INTRAVENOUS

## 2018-06-29 MED ORDER — ONDANSETRON HCL 4 MG/2ML IJ SOLN
INTRAMUSCULAR | Status: AC
Start: 2018-06-29 — End: ?
  Filled 2018-06-29: qty 2

## 2018-06-29 MED ORDER — MIDAZOLAM HCL 5 MG/5ML IJ SOLN
INTRAMUSCULAR | Status: DC | PRN
  Administered 2018-06-29 (×2): 1 mg via INTRAVENOUS

## 2018-06-29 MED ORDER — FENTANYL CITRATE (PF) 250 MCG/5ML IJ SOLN
INTRAMUSCULAR | Status: AC
Start: 2018-06-29 — End: ?
  Filled 2018-06-29: qty 5

## 2018-06-29 MED ORDER — ROPIVACAINE HCL 5 MG/ML IJ SOLN
INTRAMUSCULAR | Status: DC | PRN
  Administered 2018-06-29: 30 mL via PERINEURAL

## 2018-06-29 MED ORDER — ONDANSETRON HCL 4 MG PO TABS: 4.0000 mg | ORAL_TABLET | Freq: Four times a day (QID) | ORAL | Status: DC | PRN

## 2018-06-29 MED ORDER — OXYCODONE HCL 5 MG PO TABS
5.0000 mg | ORAL_TABLET | ORAL | Status: DC | PRN
  Administered 2018-06-29 – 2018-06-30 (×3): 5 mg via ORAL
  Filled 2018-06-29 (×3): qty 1

## 2018-06-29 MED ORDER — MENTHOL 3 MG MT LOZG: 1.0000 | LOZENGE | OROMUCOSAL | Status: DC | PRN

## 2018-06-29 MED ORDER — BUPIVACAINE IN DEXTROSE 0.75-8.25 % IT SOLN
INTRATHECAL | Status: DC | PRN
  Administered 2018-06-29: 1.6 mL via INTRATHECAL

## 2018-06-29 MED ORDER — FENTANYL CITRATE (PF) 100 MCG/2ML IJ SOLN: 25.0000 ug | INTRAMUSCULAR | Status: DC | PRN

## 2018-06-29 MED ORDER — BUPIVACAINE IN DEXTROSE 0.75-8.25 % IT SOLN: INTRATHECAL | Status: DC | PRN

## 2018-06-29 MED ORDER — PANTOPRAZOLE SODIUM 40 MG PO TBEC
40.0000 mg | DELAYED_RELEASE_TABLET | Freq: Every day | ORAL | Status: DC
  Administered 2018-06-29 – 2018-06-30 (×2): 40 mg via ORAL
  Filled 2018-06-29 (×2): qty 1

## 2018-06-29 MED ORDER — METHOCARBAMOL 1000 MG/10ML IJ SOLN
500.0000 mg | Freq: Four times a day (QID) | INTRAVENOUS | Status: DC | PRN
  Filled 2018-06-29: qty 5

## 2018-06-29 MED ORDER — POLYETHYLENE GLYCOL 3350 17 G PO PACK: 17.0000 g | PACK | Freq: Every day | ORAL | Status: DC | PRN

## 2018-06-29 MED ORDER — TRANEXAMIC ACID 1000 MG/10ML IV SOLN
INTRAVENOUS | Status: DC | PRN
  Administered 2018-06-29: 2000 mg via TOPICAL

## 2018-06-29 MED ORDER — TRANEXAMIC ACID 1000 MG/10ML IV SOLN
1000.0000 mg | Freq: Once | INTRAVENOUS | Status: AC
  Administered 2018-06-29: 1000 mg via INTRAVENOUS
  Filled 2018-06-29: qty 10

## 2018-06-29 MED ORDER — AMPHETAMINE-DEXTROAMPHETAMINE 10 MG PO TABS
15.0000 mg | ORAL_TABLET | Freq: Every day | ORAL | Status: DC
  Filled 2018-06-29: qty 2

## 2018-06-29 MED ORDER — VALACYCLOVIR HCL 500 MG PO TABS: 1000.0000 mg | ORAL_TABLET | Freq: Every day | ORAL | Status: DC | PRN

## 2018-06-29 MED ORDER — ASPIRIN EC 81 MG PO TBEC: 81.0000 mg | DELAYED_RELEASE_TABLET | Freq: Two times a day (BID) | ORAL | 0 refills | Status: DC

## 2018-06-29 MED ORDER — ONDANSETRON HCL 4 MG/2ML IJ SOLN: 4.0000 mg | Freq: Once | INTRAMUSCULAR | Status: DC | PRN

## 2018-06-29 MED ORDER — LEVOTHYROXINE SODIUM 50 MCG PO TABS
50.0000 ug | ORAL_TABLET | Freq: Every day | ORAL | Status: DC
  Administered 2018-06-30: 50 ug via ORAL
  Filled 2018-06-29: qty 1

## 2018-06-29 MED ORDER — ONDANSETRON HCL 4 MG/2ML IJ SOLN
4.0000 mg | Freq: Four times a day (QID) | INTRAMUSCULAR | Status: DC | PRN
  Administered 2018-06-29: 4 mg via INTRAVENOUS
  Filled 2018-06-29: qty 2

## 2018-06-29 MED ORDER — METHOCARBAMOL 500 MG PO TABS: 500.0000 mg | ORAL_TABLET | Freq: Two times a day (BID) | ORAL | 0 refills | Status: DC

## 2018-06-29 MED ORDER — LACTATED RINGERS IV SOLN
INTRAVENOUS | Status: DC
  Administered 2018-06-29 (×2): via INTRAVENOUS

## 2018-06-29 MED ORDER — PROPOFOL 500 MG/50ML IV EMUL
INTRAVENOUS | Status: DC | PRN
  Administered 2018-06-29: 75 ug/kg/min via INTRAVENOUS

## 2018-06-29 MED ORDER — FENTANYL CITRATE (PF) 100 MCG/2ML IJ SOLN
INTRAMUSCULAR | Status: AC
  Administered 2018-06-29: 50 ug
  Filled 2018-06-29: qty 2

## 2018-06-29 MED ORDER — DOCUSATE SODIUM 100 MG PO CAPS
100.0000 mg | ORAL_CAPSULE | Freq: Two times a day (BID) | ORAL | Status: DC
  Administered 2018-06-29 – 2018-06-30 (×2): 100 mg via ORAL
  Filled 2018-06-29 (×2): qty 1

## 2018-06-29 MED ORDER — CELECOXIB 200 MG PO CAPS
200.0000 mg | ORAL_CAPSULE | Freq: Two times a day (BID) | ORAL | Status: DC
  Administered 2018-06-29 – 2018-06-30 (×2): 200 mg via ORAL
  Filled 2018-06-29 (×2): qty 1

## 2018-06-29 MED ORDER — TRANEXAMIC ACID 1000 MG/10ML IV SOLN
2000.0000 mg | INTRAVENOUS | Status: DC
  Filled 2018-06-29: qty 20

## 2018-06-29 MED ORDER — LISDEXAMFETAMINE DIMESYLATE 70 MG PO CAPS
70.0000 mg | ORAL_CAPSULE | Freq: Every day | ORAL | Status: DC
  Filled 2018-06-29: qty 1

## 2018-06-29 MED ORDER — SODIUM CHLORIDE 0.9 % IR SOLN
Status: DC | PRN
  Administered 2018-06-29: 3000 mL

## 2018-06-29 SURGICAL SUPPLY — 51 items
BANDAGE ELASTIC 6 VELCRO ST LF (GAUZE/BANDAGES/DRESSINGS) ×1 IMPLANT
BANDAGE ESMARK 6X9 LF (GAUZE/BANDAGES/DRESSINGS) ×1 IMPLANT
BLADE SAG 18X100X1.27 (BLADE) ×2 IMPLANT
BLADE SAGITTAL 13X1.27X60 (BLADE) ×1 IMPLANT
BLADE SAW SGTL 13X75X1.27 (BLADE) ×1 IMPLANT
BNDG CMPR 9X6 STRL LF SNTH (GAUZE/BANDAGES/DRESSINGS) ×1
BNDG CMPR MED 10X6 ELC LF (GAUZE/BANDAGES/DRESSINGS)
BNDG ELASTIC 6X10 VLCR STRL LF (GAUZE/BANDAGES/DRESSINGS) ×1 IMPLANT
BNDG ESMARK 6X9 LF (GAUZE/BANDAGES/DRESSINGS) ×2
BOWL SMART MIX CTS (DISPOSABLE) ×2 IMPLANT
CAPT KNEE TOTAL 3 ATTUNE ×1 IMPLANT
CEMENT HV SMART SET (Cement) ×4 IMPLANT
COVER SURGICAL LIGHT HANDLE (MISCELLANEOUS) ×2 IMPLANT
CUFF TOURNIQUET SINGLE 34IN LL (TOURNIQUET CUFF) ×2 IMPLANT
CUFF TOURNIQUET SINGLE 44IN (TOURNIQUET CUFF) IMPLANT
DRAPE EXTREMITY T 121X128X90 (DRAPE) ×2 IMPLANT
DRAPE U-SHAPE 47X51 STRL (DRAPES) ×2 IMPLANT
DRSG AQUACEL AG ADV 3.5X10 (GAUZE/BANDAGES/DRESSINGS) ×2 IMPLANT
DURAPREP 26ML APPLICATOR (WOUND CARE) ×2 IMPLANT
ELECT REM PT RETURN 9FT ADLT (ELECTROSURGICAL) ×2
ELECTRODE REM PT RTRN 9FT ADLT (ELECTROSURGICAL) ×1 IMPLANT
GLOVE BIO SURGEON STRL SZ7.5 (GLOVE) ×2 IMPLANT
GLOVE BIO SURGEON STRL SZ8.5 (GLOVE) ×2 IMPLANT
GLOVE BIOGEL PI IND STRL 8 (GLOVE) ×1 IMPLANT
GLOVE BIOGEL PI IND STRL 9 (GLOVE) ×1 IMPLANT
GLOVE BIOGEL PI INDICATOR 8 (GLOVE) ×1
GLOVE BIOGEL PI INDICATOR 9 (GLOVE) ×1
GOWN STRL REUS W/ TWL LRG LVL3 (GOWN DISPOSABLE) ×1 IMPLANT
GOWN STRL REUS W/ TWL XL LVL3 (GOWN DISPOSABLE) ×2 IMPLANT
GOWN STRL REUS W/TWL LRG LVL3 (GOWN DISPOSABLE) ×2
GOWN STRL REUS W/TWL XL LVL3 (GOWN DISPOSABLE) ×4
HANDPIECE INTERPULSE COAX TIP (DISPOSABLE) ×2
HOOD PEEL AWAY FACE SHEILD DIS (HOOD) ×4 IMPLANT
KIT BASIN OR (CUSTOM PROCEDURE TRAY) ×2 IMPLANT
KIT TURNOVER KIT B (KITS) ×2 IMPLANT
MANIFOLD NEPTUNE II (INSTRUMENTS) ×2 IMPLANT
NEEDLE 22X1 1/2 (OR ONLY) (NEEDLE) ×4 IMPLANT
NS IRRIG 1000ML POUR BTL (IV SOLUTION) ×2 IMPLANT
PACK TOTAL JOINT (CUSTOM PROCEDURE TRAY) ×2 IMPLANT
PAD ARMBOARD 7.5X6 YLW CONV (MISCELLANEOUS) ×4 IMPLANT
SET HNDPC FAN SPRY TIP SCT (DISPOSABLE) ×1 IMPLANT
SUT VIC AB 1 CTX 36 (SUTURE) ×2
SUT VIC AB 1 CTX36XBRD ANBCTR (SUTURE) ×1 IMPLANT
SUT VIC AB 2-0 CT1 27 (SUTURE) ×2
SUT VIC AB 2-0 CT1 TAPERPNT 27 (SUTURE) ×1 IMPLANT
SUT VIC AB 3-0 CT1 27 (SUTURE) ×2
SUT VIC AB 3-0 CT1 TAPERPNT 27 (SUTURE) ×1 IMPLANT
SYR CONTROL 10ML LL (SYRINGE) ×4 IMPLANT
TOWEL OR 17X24 6PK STRL BLUE (TOWEL DISPOSABLE) ×2 IMPLANT
TOWEL OR 17X26 10 PK STRL BLUE (TOWEL DISPOSABLE) ×2 IMPLANT
TRAY CATH 16FR W/PLASTIC CATH (SET/KITS/TRAYS/PACK) IMPLANT

## 2018-06-29 NOTE — Interval H&P Note (Signed)
History and Physical Interval Note:  06/29/2018 12:07 PM  Christy [name]  has presented today for surgery, with the diagnosis of LEFT KNEE OSTEOARTHRITIS  The various methods of treatment have been discussed with the patient and family. After consideration of risks, benefits and other options for treatment, the patient has consented to  Procedure(s): LEFT TOTAL KNEE ARTHROPLASTY (Left) as a surgical intervention .  The patient's history has been reviewed, patient examined, no change in status, stable for surgery.  I have reviewed the patient's chart and labs.  Questions were answered to the patient's satisfaction.     Kerin Salen

## 2018-06-29 NOTE — Discharge Instructions (Signed)

## 2018-06-29 NOTE — Progress Notes (Signed)
Orthopedic Tech Progress Note Patient Details:  Christy [name] 06/04/1956 [id number]  Ortho Devices Type of Ortho Device: Bone foam zero knee Ortho Device/Splint Location: lle Ortho Device/Splint Interventions: Application   Post Interventions Patient Tolerated: Well Instructions Provided: Care of device   [name] 06/29/2018, 4:13 PM

## 2018-06-29 NOTE — Op Note (Signed)
PATIENT ID:      Christy [name]  MRN:     [id number] DOB/AGE:    Jul 06, 1956 / 62 y.o.       OPERATIVE REPORT    DATE OF PROCEDURE:  06/29/2018       PREOPERATIVE DIAGNOSIS:   LEFT KNEE OSTEOARTHRITIS      Estimated body mass index is 30.21 kg/m as calculated from the following:   Height as of this encounter: 5' 6.5" (1.689 m).   Weight as of this encounter: 190 lb (86.2 kg).                                                        POSTOPERATIVE DIAGNOSIS:   LEFT KNEE OSTEOARTHRITIS                                                                      PROCEDURE:  Procedure(s): LEFT TOTAL KNEE ARTHROPLASTY Using DepuyAttune RP implants #5L Femur, #5Tibia, 5 mm Attune RP bearing, 38 Patella     SURGEON: Kerin Salen    ASSISTANT:   Kerry Hough. [employer]   (Present and scrubbed throughout the case, critical for assistance with exposure, retraction, instrumentation, and closure.)         ANESTHESIA: Spinal, 20cc Exparel, 50cc 0.25% Marcaine  EBL: 300  FLUID REPLACEMENT: 1600 crystalloid  TOURNIQUET TIME: 0  Drains: None  Tranexamic Acid: 1gm IV, 2gm topical  COMPLICATIONS:  None         INDICATIONS FOR PROCEDURE: The patient has  LEFT KNEE OSTEOARTHRITIS, Var deformities, XR shows bone on bone arthritis, lateral subluxation of tibia. Patient has failed all conservative measures including anti-inflammatory medicines, narcotics, attempts at  exercise and weight loss, cortisone injections and viscosupplementation.  Risks and benefits of surgery have been discussed, questions answered.   DESCRIPTION OF PROCEDURE: The patient identified by armband, received  IV antibiotics, in the holding area at [facility]. Patient taken to the operating room, appropriate anesthetic  monitors were attached, and Spinal anesthesia was  induced. Tourniquet  applied high to the operative thigh. Lateral post and foot positioner  applied to the table, the lower extremity was then prepped and draped  in  usual sterile fashion from the toes to the tourniquet. Time-out procedure was performed. We began the operation, with the knee flexed 120 degrees, by making the anterior midline incision starting at handbreadth above the patella going over the patella 1 cm medial to and 4 cm distal to the tibial tubercle. Small bleeders in the skin and the  subcutaneous tissue identified and cauterized. Transverse retinaculum was incised and reflected medially and a medial parapatellar arthrotomy was accomplished. the patella was everted and theprepatellar fat pad resected. The superficial medial collateral  ligament was then elevated from anterior to posterior along the proximal  flare of the tibia and anterior half of the menisci resected. The knee was hyperflexed exposing bone on bone arthritis. Peripheral and notch osteophytes as well as the cruciate ligaments were then resected. We continued to  work our way around posteriorly along the  proximal tibia, and externally  rotated the tibia subluxing it out from underneath the femur. A McHale  retractor was placed through the notch and a lateral Hohmann retractor  placed, and we then drilled through the proximal tibia in line with the  axis of the tibia followed by an intramedullary guide rod and 2-degree  posterior slope cutting guide. The tibial cutting guide, 3 degree posterior sloped, was pinned into place allowing resection of 6 mm of bone medially and 12 mm of bone laterally. Satisfied with the tibial resection, we then  entered the distal femur 2 mm anterior to the PCL origin with the  intramedullary guide rod and applied the distal femoral cutting guide  set at 9 mm, with 5 degrees of valgus. This was pinned along the  epicondylar axis. At this point, the distal femoral cut was accomplished without difficulty. We then sized for a #5L femoral component and pinned the guide in 3 degrees of external rotation. The chamfer cutting guide was pinned into place. The  anterior, posterior, and chamfer cuts were accomplished without difficulty followed by  the Attune RP box cutting guide and the box cut. We also removed posterior osteophytes from the posterior femoral condyles. At this  time, the knee was brought into full extension. We checked our  extension and flexion gaps and found them symmetric for a 5 mm bearing. Distracting in extension with a lamina spreader, the posterior horns of the menisci were removed, and Exparel, diluted to 60 cc, with 20cc NS, and 20cc 0.5% Marcaine,was injected into the capsule and synovium of the knee. The posterior patella cut was accomplished with the 9.5 mm Attune cutting guide, sized for a 32mm dome, and the fixation pegs drilled.The knee  was then once again hyperflexed exposing the proximal tibia. We sized for a # 5 tibial base plate, applied the smokestack and the conical reamer followed by the the Delta fin keel punch. We then hammered into place the Attune RP trial femoral component, drilled the lugs, inserted a  5 mm trial bearing, trial patellar button, and took the knee through range of motion from 0-130 degrees. No thumb pressure was required for patellar Tracking. At this point, the limb was wrapped with an Esmarch bandage and the tourniquet inflated to 350 mmHg. All trial components were removed, mating surfaces irrigated with pulse lavage, and dried with suction and sponges. 10 cc of the Exparel solution was applied to the cancellus bone of the patella distal femur and proximal tibia.  After waiting 1 minute, the bony surfaces were again, dried with sponges. A double batch of DePuy HV cement with 1500 mg of Zinacef was mixed and applied to all bony metallic mating surfaces except for the posterior condyles of the femur itself. In order, we hammered into place the tibial tray and removed excess cement, the femoral component and removed excess cement. The final Attune RP bearing  was inserted, and the knee brought to full  extension with compression.  The patellar button was clamped into place, and excess cement  removed. While the cement cured the wound was irrigated out with normal saline solution pulse lavage. Ligament stability and patellar tracking were checked and found to be excellent. The parapatellar arthrotomy was closed with  running #1 Vicryl suture. The subcutaneous tissue with 0 and 2-0 undyed  Vicryl suture, and the skin with running 3-0 SQ vicryl. A dressing of Xeroform,  4 x 4, dressing sponges, Webril, and Ace wrap applied. The patient  awakened,  and taken to recovery room without difficulty.   Kerin Salen 06/29/2018, 3:04 PM

## 2018-06-29 NOTE — Evaluation (Signed)
Physical Therapy Evaluation Patient Details Name: Christy [name] MRN: [id number] DOB: January 05, 1956 Today's Date: 06/29/2018   History of Present Illness  62 y.o. female admitted on 06/29/18 for elective L TKA.  Pt with significant PMH of R TKA (2014), breast CA, anemia, ADD, bil mastectomy, and gastric bypass.     Clinical Impression  Pt is POD #0 and is limited by nausea and L knee instability with transition to stand.  She was able to get OOB to chair with the use of a walker.  She is hopeful to do well enough to d/c home tomorrow.  PT to follow acutely for deficits listed below.       Follow Up Recommendations Follow surgeon's recommendation for DC plan and follow-up therapies;Supervision for mobility/OOB    Equipment Recommendations  3in1 (PT)    Recommendations for Other Services    NA    Precautions / Restrictions Precautions Precautions: Knee Precaution Booklet Issued: Yes (comment) Precaution Comments: knee exercise hanout given Restrictions Weight Bearing Restrictions: Yes LLE Weight Bearing: Weight bearing as tolerated      Mobility  Bed Mobility Overal bed mobility: Needs Assistance Bed Mobility: Supine to Sit     Supine to sit: HOB elevated;Min assist     General bed mobility comments: Min assist to help progress right leg to EOB.   Transfers Overall transfer level: Needs assistance Equipment used: Rolling walker (2 wheeled) Transfers: Sit to/from [facility] Sit to Stand: Mod assist Stand pivot transfers: Min assist       General transfer comment: Mod assist to come to standing over weak R knee.  Pt almost buckeled to the ground, but was able to recover with help from the PT and use of her arms on the RW.  Once up and arms fully supporting her, she was able to take pivotal steps around to the chair with R knee instability noted.    Ambulation/Gait             General Gait Details: unable at this time due to nausea and L knee  weakness         Balance Overall balance assessment: Needs assistance Sitting-balance support: Feet supported;No upper extremity supported Sitting balance-Leahy Scale: Good     Standing balance support: Bilateral upper extremity supported Standing balance-Leahy Scale: Poor                               Pertinent Vitals/Pain Pain Assessment: Faces Faces Pain Scale: Hurts whole lot Pain Location: left thigh Pain Descriptors / Indicators: Aching;Burning Pain Intervention(s): Limited activity within patient's tolerance;Monitored during session;Repositioned    Home Living Family/patient expects to be discharged to:: Private residence Living Arrangements: Spouse/significant other Available Help at Discharge: Family;Available 24 hours/day Type of Home: House Home Access: Stairs to enter Entrance Stairs-Rails: None Entrance Stairs-Number of Steps: 1.5 Home Layout: Two level;Bed/bath upstairs Home Equipment: Walker - 2 wheels Additional Comments: gave BSC away    Prior Function Level of Independence: Independent                  Extremity/Trunk Assessment   Upper Extremity Assessment Upper Extremity Assessment: Overall WFL for tasks assessed    Lower Extremity Assessment LLE Deficits / Details: left leg with normal post op pain and weakness, ankle at least 3/5, knee 2/5, hip 2/5    Cervical / Trunk Assessment Cervical / Trunk Assessment: Normal  Communication   Communication: No difficulties  Cognition Arousal/Alertness: Awake/alert Behavior During Therapy: WFL for tasks assessed/performed Overall Cognitive Status: Within Functional Limits for tasks assessed                                           Exercises Total Joint Exercises Ankle Circles/Pumps: AROM;Both;20 reps   Assessment/Plan    PT Assessment Patient needs continued PT services  PT Problem List Decreased strength;Decreased activity tolerance;Decreased range of  motion;Decreased balance;Decreased mobility;Decreased knowledge of use of DME;Decreased knowledge of precautions;Pain       PT Treatment Interventions DME instruction;Gait training;Stair training;Functional mobility training;Therapeutic activities;Therapeutic exercise;Balance training;Patient/family education;Manual techniques;Modalities    PT Goals (Current goals can be found in the Care Plan section)  Acute Rehab PT Goals Patient Stated Goal: to go home tomorrow PT Goal Formulation: With patient Time For Goal Achievement: 07/06/18 Potential to Achieve Goals: Good    Frequency 7X/week           AM-PAC PT "6 Clicks" Daily Activity  Outcome Measure Difficulty turning over in bed (including adjusting bedclothes, sheets and blankets)?: A Little Difficulty moving from lying on back to sitting on the side of the bed? : A Little Difficulty sitting down on and standing up from a chair with arms (e.g., wheelchair, bedside commode, etc,.)?: A Little Help needed moving to and from a bed to chair (including a wheelchair)?: A Lot Help needed walking in hospital room?: A Lot Help needed climbing 3-5 steps with a railing? : Total 6 Click Score: 14    End of Session Equipment Utilized During Treatment: Gait belt Activity Tolerance: Other (comment)(limited by nausea and weak L knee) Patient left: in chair;with call bell/phone within reach;with family/visitor present Nurse Communication: Mobility status;Other (comment)(needs nausea meds) PT Visit Diagnosis: Muscle weakness (generalized) (M62.81);Difficulty in walking, not elsewhere classified (R26.2);Pain Pain - Right/Left: Left Pain - part of body: Knee    Time: 1610-9604 PT Time Calculation (min) (ACUTE ONLY): 18 min   Charges:        Wells Guiles B. Rohan Juenger, PT, DPT [phone]    PT Evaluation $PT Eval Moderate Complexity: 1 Mod     06/29/2018, 6:01 PM

## 2018-06-29 NOTE — Anesthesia Preprocedure Evaluation (Addendum)
Anesthesia Evaluation  Patient identified by MRN, date of birth, ID band Patient awake    Reviewed: Allergy & Precautions, NPO status , Patient's Chart, lab work & pertinent test results  Airway Mallampati: II  TM Distance: >3 FB Neck ROM: Full    Dental no notable dental hx.    Pulmonary neg pulmonary ROS,    Pulmonary exam normal breath sounds clear to auscultation       Cardiovascular negative cardio ROS Normal cardiovascular exam Rhythm:Regular Rate:Normal  ECG: NSR, rate 95   Neuro/Psych PSYCHIATRIC DISORDERS ADD (attention deficit disorder)    GI/Hepatic Neg liver ROS, hiatal hernia, GERD  Medicated and Controlled,  Endo/Other  Hypothyroidism   Renal/GU negative Renal ROS     Musculoskeletal  (+) Arthritis , Osteoarthritis,    Abdominal   Peds  Hematology negative hematology ROS (+)   Anesthesia Other Findings LEFT KNEE OSTEOARTHRITIS  Reproductive/Obstetrics                            Anesthesia Physical Anesthesia Plan  ASA: II  Anesthesia Plan: Spinal and Regional   Post-op Pain Management:  Regional for Post-op pain   Induction:   PONV Risk Score and Plan: 2 and Ondansetron, Dexamethasone, Midazolam and Treatment may vary due to age or medical condition  Airway Management Planned: Natural Airway  Additional Equipment:   Intra-op Plan:   Post-operative Plan:   Informed Consent: I have reviewed the patients History and Physical, chart, labs and discussed the procedure including the risks, benefits and alternatives for the proposed anesthesia with the patient or authorized representative who has indicated his/her understanding and acceptance.   Dental advisory given  Plan Discussed with: CRNA  Anesthesia Plan Comments:         Anesthesia Quick Evaluation

## 2018-06-29 NOTE — Anesthesia Procedure Notes (Signed)
Spinal  Patient location during procedure: OR Start time: 06/29/2018 1:40 PM End time: 06/29/2018 1:50 PM Staffing Anesthesiologist: Murvin Natal, MD Performed: anesthesiologist  Preanesthetic Checklist Completed: patient identified, surgical consent, pre-op evaluation, timeout performed, IV checked, risks and benefits discussed and monitors and equipment checked Spinal Block Patient position: sitting Prep: DuraPrep Patient monitoring: cardiac monitor, continuous pulse ox and blood pressure Approach: midline Location: L4-5 Injection technique: single-shot Needle Needle type: Pencan  Needle gauge: 24 G Needle length: 9 cm Assessment Sensory level: T10 Additional Notes Functioning IV was confirmed and monitors were applied. Sterile prep and drape, including hand hygiene and sterile gloves were used. The patient was positioned and the spine was prepped. The skin was anesthetized with lidocaine.  Free flow of clear CSF was obtained prior to injecting local anesthetic into the CSF.  The spinal needle aspirated freely following injection.  The needle was carefully withdrawn.  The patient tolerated the procedure well.

## 2018-06-29 NOTE — Transfer of Care (Signed)
Immediate Anesthesia Transfer of Care Note  Patient: Christy [name]  Procedure(s) Performed: LEFT TOTAL KNEE ARTHROPLASTY (Left Knee)  Patient Location: PACU  Anesthesia Type:Spinal  Level of Consciousness: awake, alert  and oriented  Airway & Oxygen Therapy: Patient Spontanous Breathing and Patient connected to nasal cannula oxygen  Post-op Assessment: Report given to RN and Post -op Vital signs reviewed and stable  Post vital signs: Reviewed and stable  Last Vitals:  Vitals Value Taken Time  BP 119/60 06/29/2018  3:30 PM  Temp    Pulse 60 06/29/2018  3:31 PM  Resp 13 06/29/2018  3:31 PM  SpO2 98 % 06/29/2018  3:31 PM  Vitals shown include unvalidated device data.  Last Pain:  Vitals:   06/29/18 1114  TempSrc:   PainSc: 3          Complications: No apparent anesthesia complications

## 2018-06-29 NOTE — Anesthesia Procedure Notes (Signed)
Anesthesia Regional Block: Adductor canal block   Pre-Anesthetic Checklist: ,, timeout performed, Correct Patient, Correct Site, Correct Laterality, Correct Procedure,, site marked, risks and benefits discussed, Surgical consent,  Pre-op evaluation,  At surgeon's request and post-op pain management  Laterality: Left  Prep: chloraprep       Needles:  Injection technique: Single-shot  Needle Type: Echogenic Stimulator Needle     Needle Length: 10cm  Needle Gauge: 21     Additional Needles:   Procedures:,,,, ultrasound used (permanent image in chart),,,,  Narrative:  Start time: 06/29/2018 1:10 PM End time: 06/29/2018 1:20 PM Injection made incrementally with aspirations every 5 mL.  Performed by: Personally  Anesthesiologist: Murvin Natal, MD  Additional Notes: Functioning IV was confirmed and monitors were applied.  A 178mm 21ga Pajunk echogenic stimulator needle was used. Sterile prep, hand hygiene and sterile gloves were used.  Negative aspiration and negative test dose prior to incremental administration of local anesthetic. The patient tolerated the procedure well.

## 2018-06-30 LAB — CBC
HCT: 35 % — ABNORMAL LOW (ref 36.0–46.0)
Hemoglobin: 11 g/dL — ABNORMAL LOW (ref 12.0–15.0)
MCH: 28.4 pg (ref 26.0–34.0)
MCHC: 31.4 g/dL (ref 30.0–36.0)
MCV: 90.2 fL (ref 78.0–100.0)
Platelets: 126 10*3/uL — ABNORMAL LOW (ref 150–400)
RBC: 3.88 MIL/uL (ref 3.87–5.11)
RDW: 13.6 % (ref 11.5–15.5)
WBC: 6.2 10*3/uL (ref 4.0–10.5)

## 2018-06-30 LAB — BASIC METABOLIC PANEL
ANION GAP: 8 (ref 5–15)
BUN: 7 mg/dL — ABNORMAL LOW (ref 8–23)
CALCIUM: 8.7 mg/dL — AB (ref 8.9–10.3)
CO2: 27 mmol/L (ref 22–32)
Chloride: 108 mmol/L (ref 98–111)
Creatinine, Ser: 0.73 mg/dL (ref 0.44–1.00)
GFR calc Af Amer: >60 mL/min (ref >60)
GFR calc non Af Amer: >60 mL/min (ref >60)
Glucose, Bld: 120 mg/dL — ABNORMAL HIGH (ref 70–99)
Potassium: 4.5 mmol/L (ref 3.5–5.1)
Sodium: 143 mmol/L (ref 135–145)

## 2018-06-30 NOTE — Progress Notes (Signed)
Physical Therapy Treatment Patient Details Name: Christy [name] MRN: [id number] DOB: [birth year] Today's Date: 06/30/2018    History of Present Illness 62 y.o. female admitted on 06/29/18 for elective L TKA.  Pt with significant PMH of R TKA (2014), breast CA, anemia, ADD, bil mastectomy, and gastric bypass.       PT Comments    Pt performed gait training, stair training and therapeutic exercises.  Pt with no signs of knee instability and moving quite well.  She is ready to d/c home from a mobility stand point at this time.  Informed nursing.  Pt is eager to return home.    Follow Up Recommendations  Follow surgeon's recommendation for DC plan and follow-up therapies;Supervision for mobility/OOB     Equipment Recommendations  3in1 (PT)    Recommendations for Other Services       Precautions / Restrictions Precautions Precautions: Knee Precaution Booklet Issued: Yes (comment) Precaution Comments: reviewed no objects underneath knee.   Restrictions Weight Bearing Restrictions: Yes LLE Weight Bearing: Weight bearing as tolerated    Mobility  Bed Mobility Overal bed mobility: Needs Assistance;Modified Independent Bed Mobility: Supine to Sit     Supine to sit: Modified independent (Device/Increase time)     General bed mobility comments: Good technique  Transfers Overall transfer level: Needs assistance Equipment used: Rolling walker (2 wheeled) Transfers: Sit to/from [facility] Sit to Stand: Min guard         General transfer comment: Pt with decreased assistance and improved ease.  Min guard for safety.  Cues for hand placement.    Ambulation/Gait Ambulation/Gait assistance: Min guard Gait Distance (Feet): 140 Feet Assistive device: Rolling walker (2 wheeled) Gait Pattern/deviations: Step-through pattern;Trunk flexed;Antalgic     General Gait Details: Cues for upper trunk control and gait symmetry with step through pattern.      Stairs Stairs: Yes Stairs assistance: Min guard Stair Management: No rails;One rail Right Number of Stairs: 4 General stair comments: curb trial x2 and 2 stairs with R rail.  pt required min guard for both techniques.  Cues for hand placement on railing and use of RW during stair training.  Cues for sequencing.     Wheelchair Mobility    Modified Rankin (Stroke Patients Only)       Balance Overall balance assessment: Needs assistance Sitting-balance support: Feet supported;No upper extremity supported Sitting balance-Leahy Scale: Good       Standing balance-Leahy Scale: Poor                              Cognition Arousal/Alertness: Awake/alert Behavior During Therapy: WFL for tasks assessed/performed Overall Cognitive Status: Within Functional Limits for tasks assessed                                        Exercises Total Joint Exercises Ankle Circles/Pumps: AROM;Both;20 reps;Supine Quad Sets: AROM;Left;10 reps;Supine Heel Slides: AAROM;Left;10 reps;Supine Hip ABduction/ADduction: AROM;Left;10 reps;Supine Straight Leg Raises: AAROM;Left;10 reps;Supine Long Arc Quad: AROM;Left;10 reps;Seated Goniometric ROM: 80 degrees flexion.      General Comments        Pertinent Vitals/Pain Pain Assessment: 0-10 Faces Pain Scale: Hurts whole lot Pain Location: left thigh Pain Descriptors / Indicators: Aching;Burning Pain Intervention(s): Monitored during session;Repositioned    Home Living  Prior Function            PT Goals (current goals can now be found in the care plan section) Acute Rehab PT Goals Patient Stated Goal: to go home today Potential to Achieve Goals: Good Progress towards PT goals: Progressing toward goals    Frequency    7X/week      PT Plan Current plan remains appropriate    Co-evaluation              AM-PAC PT "6 Clicks" Daily Activity  Outcome Measure   Difficulty turning over in bed (including adjusting bedclothes, sheets and blankets)?: None Difficulty moving from lying on back to sitting on the side of the bed? : None Difficulty sitting down on and standing up from a chair with arms (e.g., wheelchair, bedside commode, etc,.)?: A Little Help needed moving to and from a bed to chair (including a wheelchair)?: A Little Help needed walking in hospital room?: A Little Help needed climbing 3-5 steps with a railing? : A Little 6 Click Score: 20    End of Session Equipment Utilized During Treatment: Gait belt Activity Tolerance: Other (comment);Patient tolerated treatment well(reports nausea but refusing meds.  ) Patient left: in chair;with call bell/phone within reach;with family/visitor present Nurse Communication: Mobility status PT Visit Diagnosis: Muscle weakness (generalized) (M62.81);Difficulty in walking, not elsewhere classified (R26.2);Pain Pain - Right/Left: Left Pain - part of body: Knee     Time: 5053-9767 PT Time Calculation (min) (ACUTE ONLY): 22 min  Charges:  $Gait Training: 8-22 mins                    G Codes:       Governor Rooks, PTA pager [phone]    Cristela Blue 06/30/2018, 9:59 AM

## 2018-06-30 NOTE — Progress Notes (Signed)
PATIENT ID: STEPAHNIE CAMPO  MRN: [id number]  DOB/AGE:  February 17, 1956 / 62 y.o.  1 Day Post-Op Procedure(s) (LRB): LEFT TOTAL KNEE ARTHROPLASTY (Left)    PROGRESS NOTE Subjective: Patient is alert, oriented, no Nausea, no Vomiting, yes passing gas. Taking PO well. Denies SOB, Chest or Calf Pain. Using Incentive Spirometer, PAS in place. Ambulate WBAT with pt getting up with therapy yesterday evening, Patient reports pain as mild .    Objective: Vital signs in last 24 hours: Vitals:   06/29/18 1636 06/29/18 2005 06/29/18 2356 06/30/18 0458  BP: 136/77 (!) 141/100 134/68 133/69  Pulse: 76 77 (!) 105 87  Resp: 18 12  15   Temp:  98.4 F (36.9 C) 98.9 F (37.2 C) 99.3 F (37.4 C)  TempSrc:  Oral Oral Oral  SpO2: 100% 100% 96% 96%  Weight:      Height:          Intake/Output from previous day: I/O last 3 completed shifts: In: 8110 [P.O.:240; I.V.:1400; Other:75] Out: 550 [Urine:500; Blood:50]   Intake/Output this shift: No intake/output data recorded.   LABORATORY DATA: Recent Labs    06/29/18 1111 06/30/18 0456  WBC 4.2 6.2  HGB 12.6 11.0*  HCT 40.7 35.0*  PLT 151 126*  NA 143 143  K 4.0 4.5  CL 107 108  CO2 26 27  BUN 12 7*  CREATININE 0.65 0.73  GLUCOSE 89 120*  INR 0.98  --   CALCIUM 9.0 8.7*    Examination: Neurologically intact Neurovascular intact Sensation intact distally Intact pulses distally Dorsiflexion/Plantar flexion intact Incision: dressing C/D/I and no drainage No cellulitis present Compartment soft}  Assessment:   1 Day Post-Op Procedure(s) (LRB): LEFT TOTAL KNEE ARTHROPLASTY (Left) ADDITIONAL DIAGNOSIS: Expected Acute Blood Loss Anemia, ADD, anemia and gastric bypass   Plan: PT/OT WBAT, AROM and PROM  DVT Prophylaxis:  SCDx72hrs, ASA 81 mg BID x 2 weeks DISCHARGE PLAN: Home, possibly later today if pt meets therapy goals DISCHARGE NEEDS: Walker and 3-in-1 comode seat     Joanell Rising 06/30/2018, 8:27 AM

## 2018-06-30 NOTE — Progress Notes (Signed)
Discharge instructions completed with pt. Pt verbalized understanding of the information.  Pt denies chest pain, shortness of breath, dizziness, lightheadedness, and n/v.  Pt discharged home.  

## 2018-06-30 NOTE — Care Management Note (Signed)
Case Management Note  Patient Details  Name: Christy [name] MRN: [id number] Date of Birth: 07-Nov-1956  Subjective/Objective:  62 yr old female s/p left total knee arthroplasty.  Action/Plan: .Case manager spoke with patient concerning discharge plan and DME. Patient says  She will be going to outpatient therapy at discharge. She will have support at discharge.  Has RW.                 Expected Discharge Date:  06/30/18               Expected Discharge Plan:  OP Rehab  In-House Referral:  NA  Discharge planning Services  CM Consult  Post Acute Care Choice:  Durable Medical Equipment Choice offered to:  Patient  DME Arranged:  3-N-1 DME Agency:  [place] NA  HH Arranged:  NA HH Agency:  NA  Status of Service:  Completed, signed off  If discussed at [place] of Stay Meetings, dates discussed:    Additional Comments:  Ninfa Meeker, RN 06/30/2018, 11:51 AM

## 2018-06-30 NOTE — Discharge Summary (Signed)
Patient ID: Christy [name] MRN: [id number] DOB/AGE: 62-Sep-1957 36 y.o.  Admit date: 06/29/2018 Discharge date: 06/30/2018  Admission Diagnoses:  Active Problems:   Primary osteoarthritis of left knee   Discharge Diagnoses:  Same  Past Medical History:  Diagnosis Date  . ADD (attention deficit disorder)   . Allergy    rhinitis  . Anemia   . Arthritis    osteo  . Breast cancer (HCC)    Breast , skin - basal   . Complication of anesthesia    gastric bypass [facility] ,  acetyl succ-, hard time awaking, placed back on vent for a couiple hour  . Dyspnea   . GERD (gastroesophageal reflux disease)   . History of hiatal hernia   . History of kidney stones    passed  . Hypothyroid   . Internal hemorrhoids   . Obesity   . Pneumonia   . Tubular adenoma of colon     Surgeries: Procedure(s): LEFT TOTAL KNEE ARTHROPLASTY on 06/29/2018   Consultants:   Discharged Condition: Improved  Hospital Course: Christy [name] is an 62 y.o. female who was admitted 06/29/2018 for operative treatment of<principal problem not specified>. Patient has severe unremitting pain that affects sleep, daily activities, and work/hobbies. After pre-op clearance the patient was taken to the operating room on 06/29/2018 and underwent  Procedure(s): LEFT TOTAL KNEE ARTHROPLASTY.    Patient was given perioperative antibiotics:  Anti-infectives (From admission, onward)   Start     Dose/Rate Route Frequency Ordered Stop   06/29/18 1625  valACYclovir (VALTREX) tablet 1,000 mg     1,000 mg Oral Daily PRN 06/29/18 1625     06/29/18 1015  ceFAZolin (ANCEF) IVPB 2g/100 mL premix     2 g 200 mL/hr over 30 Minutes Intravenous On call to O.R. 06/29/18 1000 06/29/18 1335       Patient was given sequential compression devices, early ambulation, and chemoprophylaxis to prevent DVT.  Patient benefited maximally from hospital stay and there were no complications.    Recent vital signs:  Patient Vitals for the  past 24 hrs:  BP Temp Temp src Pulse Resp SpO2  06/30/18 0458 133/69 99.3 F (37.4 C) Oral 87 15 96 %  06/29/18 2356 134/68 98.9 F (37.2 C) Oral (Abnormal) 105 no documentation 96 %  06/29/18 2005 (Abnormal) 141/100 98.4 F (36.9 C) Oral 77 12 100 %  06/29/18 1636 136/77 no documentation no documentation 76 18 100 %  06/29/18 1600 134/66 (Abnormal) 97.3 F (36.3 C) no documentation 62 (Abnormal) 9 99 %  06/29/18 1545 129/71 no documentation no documentation (Abnormal) 54 13 100 %  06/29/18 1530 119/60 97.6 F (36.4 C) no documentation 65 17 96 %     Recent laboratory studies:  Recent Labs    06/29/18 1111 06/30/18 0456  WBC 4.2 6.2  HGB 12.6 11.0*  HCT 40.7 35.0*  PLT 151 126*  NA 143 143  K 4.0 4.5  CL 107 108  CO2 26 27  BUN 12 7*  CREATININE 0.65 0.73  GLUCOSE 89 120*  INR 0.98  --   CALCIUM 9.0 8.7*     Discharge Medications:   Allergies as of 06/30/2018    Allergen Reactions Comment   Morphine And Related Itching, Swelling, Other (See Comments) SWELLING REACTION UNSPECIFIED    Levaquin [levofloxacin In D5w] Itching    Tylenol [acetaminophen] Other (See Comments) Pt instructed NOT to have tylenol after extensive gastric bypass  Medication List    Stop taking these medications   cyclobenzaprine 10 MG tablet Commonly known as:  FLEXERIL   ibuprofen 200 MG tablet Commonly known as:  ADVIL,MOTRIN     Take these medications   amphetamine-dextroamphetamine 15 MG tablet Commonly known as:  ADDERALL Take 1 tablet by mouth daily.   aspirin EC 81 MG tablet Take 1 tablet (81 mg total) by mouth 2 (two) times daily.   B-complex with vitamin C tablet Take 1 tablet by mouth daily.   celecoxib 200 MG capsule Commonly known as:  CELEBREX Take 400 mg by mouth daily.   cycloSPORINE 0.05 % ophthalmic emulsion Commonly known as:  RESTASIS Place 2 drops into both eyes daily.   fexofenadine 180 MG tablet Commonly known as:  ALLEGRA Take 180 mg by mouth  daily as needed for allergies or rhinitis.   levothyroxine 50 MCG tablet Commonly known as:  SYNTHROID, LEVOTHROID TAKE ONE TABLET BY MOUTH ONCE DAILY What changed:    how much to take  how to take this  when to take this  additional instructions   MAGNESIUM OXIDE PO Take 1 tablet by mouth daily.   methocarbamol 500 MG tablet Commonly known as:  ROBAXIN Take 1 tablet (500 mg total) by mouth 2 (two) times daily with a meal.   multivitamin capsule Take 3 capsules by mouth daily.   oxyCODONE 5 MG immediate release tablet Commonly known as:  Oxy IR/ROXICODONE Take 1 tablet (5 mg total) by mouth every 4 (four) hours as needed for severe pain.   ranitidine 300 MG tablet Commonly known as:  ZANTAC TAKE 1 TABLET BY MOUTH AT BEDTIME. What changed:    how much to take  how to take this  when to take this   triamcinolone cream 0.1 % Commonly known as:  KENALOG Apply 1 application topically 2 (two) times daily. What changed:    when to take this  reasons to take this   valACYclovir 500 MG tablet Commonly known as:  VALTREX TAKE 2 TABLETS BY MOUTH DAILY AS NEEDED. *NEEDS OFFICE VISIT FOR FURTHER REFILLS* What changed:  See the new instructions.   venlafaxine XR 150 MG 24 hr capsule Commonly known as:  EFFEXOR-XR TAKE 1 CAPSULE BY MOUTH EVERY DAY WITH BREAKFAST   Vitamin D3 5000 units Caps Take 5,000 Units by mouth daily.   VYVANSE 70 MG capsule Generic drug:  lisdexamfetamine TAKE 1 CAPSULE BY MOUTH EVERY DAY        Durable Medical Equipment  (From admission, onward)        Start     Ordered   06/29/18 1626  DME Walker rolling  Once    Question:  Patient needs a walker to treat with the following condition  Answer:  Status post total left knee replacement   06/29/18 1625   06/29/18 1626  DME 3 n 1  Once     06/29/18 1625      Diagnostic Studies: Dg Chest 2 View  Result Date: 06/01/2018 CLINICAL DATA:  Preop for left total knee arthroplasty. EXAM:  CHEST - 2 VIEW COMPARISON:  Radiographs of December 13, 2013. FINDINGS: The heart size and mediastinal contours are within normal limits. Both lungs are clear. The visualized skeletal structures are unremarkable. IMPRESSION: No active cardiopulmonary disease. Electronically Signed   By: Marijo Conception, M.D.   On: 06/01/2018 17:55    Disposition: Discharge disposition: 01-Home or Self Care       Discharge Instructions  Call MD / Call 911   Complete by:  As directed    If you experience chest pain or shortness of breath, CALL 911 and be transported to the hospital emergency room.  If you develope a fever above 101 F, pus (white drainage) or increased drainage or redness at the wound, or calf pain, call your surgeon's office.   Constipation Prevention   Complete by:  As directed    Drink plenty of fluids.  Prune juice may be helpful.  You may use a stool softener, such as Colace (over the counter) 100 mg twice a day.  Use MiraLax (over the counter) for constipation as needed.   Diet - low sodium heart healthy   Complete by:  As directed    Increase activity slowly as tolerated   Complete by:  As directed       Follow-up Information    Frederik Pear, MD In 2 weeks.   Specialty:  Orthopedic Surgery Contact information: [place] [address] [phone]            Signed: Kerin Salen 06/30/2018, 11:29 AM

## 2018-07-01 ENCOUNTER — Ambulatory Visit (INDEPENDENT_AMBULATORY_CARE_PROVIDER_SITE_OTHER): Payer: PRIVATE HEALTH INSURANCE | Admitting: Rehabilitative and Restorative Service Providers"

## 2018-07-01 ENCOUNTER — Encounter (HOSPITAL_COMMUNITY): Payer: Self-pay | Admitting: Orthopedic Surgery

## 2018-07-01 DIAGNOSIS — M25562 Pain in left knee: Secondary | ICD-10-CM

## 2018-07-01 DIAGNOSIS — M25662 Stiffness of left knee, not elsewhere classified: Secondary | ICD-10-CM | POA: Diagnosis not present

## 2018-07-01 DIAGNOSIS — M6281 Muscle weakness (generalized): Secondary | ICD-10-CM

## 2018-07-01 DIAGNOSIS — R2689 Other abnormalities of gait and mobility: Secondary | ICD-10-CM

## 2018-07-01 DIAGNOSIS — R601 Generalized edema: Secondary | ICD-10-CM

## 2018-07-01 NOTE — Anesthesia Postprocedure Evaluation (Signed)
Anesthesia Post Note  Patient: Christy [name]  Procedure(s) Performed: LEFT TOTAL KNEE ARTHROPLASTY (Left Knee)     Patient location during evaluation: PACU Anesthesia Type: Regional and Spinal Level of consciousness: oriented and awake and alert Pain management: pain level controlled Vital Signs Assessment: post-procedure vital signs reviewed and stable Respiratory status: spontaneous breathing, respiratory function stable and patient connected to nasal cannula oxygen Cardiovascular status: blood pressure returned to baseline and stable Postop Assessment: no headache, no backache and no apparent nausea or vomiting Anesthetic complications: no    Last Vitals:  Vitals:   06/29/18 2356 06/30/18 0458  BP: 134/68 133/69  Pulse: (!) 105 87  Resp:  15  Temp: 37.2 C 37.4 C  SpO2: 96% 96%    Last Pain:  Vitals:   06/30/18 1002  TempSrc:   PainSc: 3                  Christy [name]

## 2018-07-01 NOTE — Patient Instructions (Addendum)
Quad set - with and without heel supported - 5 sec hold x 10 reps   straight leg raise 10-12 inches - pause and lower slowly 10 reps   Hamstring stretch with strap - 30 sec hold x 3 reps   Heel slide - hold at maximum bend for 10 sec x 10 reps   Sitting scoot - plant foot and scoot hips forward on chair (can sit in rocking chair)  TENS UNIT: This is helpful for muscle pain and spasm.   Search and Purchase a TENS 7000 2nd edition at www.tenspros.com. It should be less than $30.     TENS unit instructions: Do not shower or bathe with the unit on Turn the unit off before removing electrodes or batteries If the electrodes lose stickiness add a drop of water to the electrodes after they are disconnected from the unit and place on plastic sheet. If you continued to have difficulty, call the TENS unit company to purchase more electrodes. Do not apply lotion on the skin area prior to use. Make sure the skin is clean and dry as this will help prolong the life of the electrodes. After use, always check skin for unusual red areas, rash or other skin difficulties. If there are any skin problems, does not apply electrodes to the same area. Never remove the electrodes from the unit by pulling the wires. Do not use the TENS unit or electrodes other than as directed. Do not change electrode placement without consultating your therapist or physician. Keep 2 fingers with between each electrode.   Axial Extension (Chin Tuck)    Pull chin in and lengthen back of neck. Hold __5__ seconds while counting out loud. Repeat __10__ times. Do __several__ sessions per day.  Shoulder Blade Squeeze    Rotate shoulders back, then squeeze shoulder blades down and back. Hold 10 sec Repeat __10__ times. Do __several __ sessions per day.  Upper Back Strength: Lower Trapezius / Rotator Cuff " L's "     Arms in waitress pose, palms up. Press hands back and slide shoulder blades down. Hold for __5__  seconds. Repeat _10___ times. 1-2 times per day.    Scapular Retraction: Elbow Flexion (Standing)  "W's"     With elbows bent to 90, pinch shoulder blades together and rotate arms out, keeping elbows bent. Repeat __10__ times per set. Do __1-2__ sets per session. Do _several ___ sessions per day.       SUPINE Tips A snow angel - 90 deg as tolerated hold 3-5 min stretching pecs     Being in the supine position means to be lying on the back. Lying on the back is the position of least compression on the bones and discs of the spine, and helps to re-align the natural curves of the back.     Thoracic Lift    Press shoulders down. Then lift mid-thoracic spine (area between the shoulder blades). Lift the breastbone slightly. Hold _5-10__ seconds. Relax. Repeat _10__ times.   [facility] [place], [place] [address]  [phone] (office) [phone] (fax)

## 2018-07-01 NOTE — Therapy (Signed)
[place], [place], [address] Phone: [phone]   Fax:  [phone]  Physical Therapy Evaluation  Patient Details  Name: Christy [name] MRN: [id number] Date of Birth: 10/24/1956 Referring Provider: Dr Frederik Pear   Encounter Date: 07/01/2018  PT End of Session - 07/01/18 1601    Visit Number  1    Number of Visits  24    Date for PT Re-Evaluation  09/23/18    PT Start Time  1441    PT Stop Time  1543    PT Time Calculation (min)  62 min    Activity Tolerance  Patient tolerated treatment well       Past Medical History:  Diagnosis Date  . ADD (attention deficit disorder)   . Allergy    rhinitis  . Anemia   . Arthritis    osteo  . Breast cancer (HCC)    Breast , skin - basal   . Complication of anesthesia    gastric bypass [facility] ,  acetyl succ-, hard time awaking, placed back on vent for a couiple hour  . Dyspnea   . GERD (gastroesophageal reflux disease)   . History of hiatal hernia   . History of kidney stones    passed  . Hypothyroid   . Internal hemorrhoids   . Obesity   . Pneumonia   . Tubular adenoma of colon     Past Surgical History:  Procedure Laterality Date  . BREAST LUMPECTOMY Right   . CESAREAN SECTION     x 2  . CHOLECYSTECTOMY    . COLONOSCOPY    . ESOPHAGOGASTRODUODENOSCOPY    . GASTRIC BYPASS    . JOINT REPLACEMENT    . KNEE ARTHROSCOPY Right    x 3  . LAPAROSCOPIC APPENDECTOMY N/A 04/03/2015   Procedure: APPENDECTOMY LAPAROSCOPIC;  Surgeon: Autumn Messing III, MD;  Location: [facility];  Service: General;  Laterality: N/A;  . LITHOTRIPSY     x3  . MASTECTOMY Bilateral    with lymph node dissection  bil reconstruction 2009  . TOTAL KNEE ARTHROPLASTY Right 12/18/2013   Procedure: TOTAL KNEE ARTHROPLASTY;  Surgeon: Kerin Salen, MD;  Location: [facility];  Service: Orthopedics;  Laterality: Right;  . TOTAL KNEE ARTHROPLASTY Left 06/29/2018  . TOTAL KNEE  ARTHROPLASTY Left 06/29/2018   Procedure: LEFT TOTAL KNEE ARTHROPLASTY;  Surgeon: Frederik Pear, MD;  Location: [facility];  Service: Orthopedics;  Laterality: Left;  Marland Kitchen VAGINAL HYSTERECTOMY      There were no vitals filed for this visit.   Subjective Assessment - 07/01/18 1347    Subjective  Patient reports increased Lt knee pain over the past 8-10 months. Underwent Lt TKA 06/01/02 without complications. Managing pain with meds and ice. Not as painful as the Rt TKA in 2014.    Pertinent History  Rt knee injury 1974 - several surgerires on Rt knee creating stress on the Lt; Rt TKA 2014; denies any other medical problems; osteopenia; bilat mastectomy 2009     Diagnostic tests  xrays     Patient Stated Goals  get the Lt knee working like the Rt     Currently in Pain?  Yes    Pain Score  4     Pain Location  Knee    Pain Orientation  Left    Pain Descriptors / Indicators  Aching    Pain Type  Surgical pain    Pain Radiating Towards  pain in mid thigh     Pain Onset  More than a month ago    Pain Frequency  Intermittent    Aggravating Factors   bending; moving     Pain Relieving Factors  meds; ice          [facility] PT Assessment - 07/01/18 0001      Assessment   Medical Diagnosis  Lt TKA    Referring Provider  Dr Frederik Pear    Onset Date/Surgical Date  06/29/18    Hand Dominance  Right    Next MD Visit  07/13/18    Prior Therapy  hospital post surgery       Precautions   Precaution Comments  s/p Lt TKA 06/29/18      Restrictions   Weight Bearing Restrictions  No      Balance Screen   Has the patient fallen in the past 6 months  No    Has the patient had a decrease in activity level because of a fear of falling?   No    Is the patient reluctant to leave their home because of a fear of falling?   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     Leisure  household chores; reading; crafts; some gardening       Observation/Other Assessments   Focus on Therapeutic Outcomes (FOTO)   99% limitation       Observation/Other Assessments-Edema    Edema  -- edema Lt LE       Sensation   Additional Comments  WFL's per pt report       Posture/Postural Control   Posture Comments  head forward; shoudlers rounded and elevated; increased thoracic kyphosis; weight shifted to the Rt       AROM   Right Knee Extension  0    Right Knee Flexion  120    Left Knee Extension  -10    Left Knee Flexion  72      Strength   Right Hip Flexion  5/5    Right Hip Extension  5/5    Right Hip ABduction  5/5    Right Knee Flexion  5/5    Right Knee Extension  5/5      Flexibility   Hamstrings  tight Lt at ~ 75 deg     [facility]  [facility]      Ambulation/Gait   Gait Comments  ambulates with rolling walker with step to giat pattern; decreased wt bearing Lt LE                 Objective measurements completed on examination: See above findings.      [facility] Adult PT Treatment/Exercise - 07/01/18 0001      Electrical Stimulation   Electrical Stimulation Location  Lt knee    Electrical Stimulation Action  IFC    Electrical Stimulation Parameters  to tolerance    Electrical Stimulation Goals  Pain;Tone      Vasopneumatic   Number Minutes Vasopneumatic   15 minutes    Vasopnuematic Location   Knee Lt     Vasopneumatic Pressure  Low    Vasopneumatic Temperature  34 deg              PT Education - 07/01/18 1426    Education Details  HEP TENS     Person(s) Educated  Patient    Methods  Explanation;Demonstration;Tactile cues;Verbal cues;Handout    Comprehension  Verbalized understanding;Returned demonstration;Verbal cues required;Tactile cues required       PT Short Term Goals - 07/01/18 1610      PT SHORT TERM GOAL #1   Title  Patient  independent in initial HEP 08/12/18    Time  6    Period  Weeks    Status  New      PT SHORT TERM GOAL #2   Title  Increase AROM Rt knee to (-) 2 deg to 105 deg 08/13/18    Time  6    Period  Weeks    Status  New      PT SHORT TERM GOAL #3   Title  Progress to single point cane for ambulation with good gait pattern 08/13/18    Time  6    Period  Weeks    Status  New        PT Long Term Goals - 07/01/18 1612      PT LONG TERM GOAL #1   Title  5-/5 to 5/5 strength  LE 09/23/18    Time  12    Period  Weeks    Status  New      PT LONG TERM GOAL #2   Title  AROM Lt knee 0 to 115 degrees 09/23/18    Time  12    Period  Weeks    Status  New      PT LONG TERM GOAL #3   Title  independent gait without assistive device for community distances 09/23/18    Time  12    Period  Weeks    Status  New      PT LONG TERM GOAL #4   Title  Independent in HEP 09/23/18    Time  12    Period  Weeks    Status  New      PT LONG TERM GOAL #5   Title  Improve FOTO to </= 60% limitation 09/23/18    Time  12    Period  Weeks    Status  New             Plan - 07/01/18 1601    Clinical Impression Statement  Lile presents s/p Lt TKA 06/29/18 with dependent gait; Lt LE decreased ROM and strength; Lt LE edema; pain and limited functional activity. She will benefit form PT to address problems identified.     History and Personal Factors relevant to plan of care:  Rt TKA     Clinical Presentation  Stable    Clinical Decision Making  Low    Rehab Potential  Good    PT Frequency  2x / week    PT Duration  12 weeks    PT Treatment/Interventions  Patient/family education;ADLs/Self Care Home Management;Cryotherapy;Electrical Stimulation;Iontophoresis 4mg /ml Dexamethasone;Moist Heat;Ultrasound;Gait training;Stair training;Functional mobility training;Therapeutic activities;Therapeutic exercise;Balance training;Neuromuscular re-education;Scar mobilization;Manual techniques;Dry needling    PT Next  Visit Plan  review exercises; progress with ROM and strengthening Lt LE; gait training; balance activities; edema management; manual work and modalities as indicated     Consulted and Agree with Plan of Care  Patient       Patient will benefit from skilled therapeutic intervention in order  to improve the following deficits and impairments:  Abnormal gait, Decreased balance, Decreased mobility, Decreased range of motion, Decreased strength, Increased edema, Pain  Visit Diagnosis: Acute pain of left knee - Plan: PT plan of care cert/re-cert  Muscle weakness (generalized) - Plan: PT plan of care cert/re-cert  Stiffness of left knee, not elsewhere classified - Plan: PT plan of care cert/re-cert  Other abnormalities of gait and mobility - Plan: PT plan of care cert/re-cert     Problem List Patient Active Problem List   Diagnosis Date Noted  . Primary osteoarthritis of left knee 06/29/2018  . Degenerative arthritis of left knee 06/07/2018  . S/P TKR (total knee replacement) using cement 03/12/2016  . Hypothyroidism 03/12/2016  . Solar lentigo 03/12/2016  . Attention deficit hyperactivity disorder (ADHD), predominantly inattentive type 07/04/2013  . Iron deficiency anemia 04/05/2013  . H/O gastric bypass 03/06/2012  . Malignant neoplasm of upper-outer quadrant of right breast in female, estrogen receptor positive ([place]) 03/04/2012  . Osteopenia 03/04/2012    Glanda Spanbauer Nilda Simmer PT, MPH  07/01/2018, 4:19 PM  [facility], [place], [place], [address] Phone: [phone]   Fax:  [phone]  Name: Christy [name] MRN: [id number] Date of Birth: [birth year]

## 2018-07-04 ENCOUNTER — Encounter: Payer: PRIVATE HEALTH INSURANCE | Admitting: Rehabilitative and Restorative Service Providers"

## 2018-07-05 ENCOUNTER — Encounter: Payer: Self-pay | Admitting: Physical Therapy

## 2018-07-05 ENCOUNTER — Ambulatory Visit (INDEPENDENT_AMBULATORY_CARE_PROVIDER_SITE_OTHER): Payer: PRIVATE HEALTH INSURANCE | Admitting: Physical Therapy

## 2018-07-05 DIAGNOSIS — M6281 Muscle weakness (generalized): Secondary | ICD-10-CM

## 2018-07-05 DIAGNOSIS — M25562 Pain in left knee: Secondary | ICD-10-CM

## 2018-07-05 DIAGNOSIS — M25662 Stiffness of left knee, not elsewhere classified: Secondary | ICD-10-CM | POA: Diagnosis not present

## 2018-07-05 DIAGNOSIS — R2689 Other abnormalities of gait and mobility: Secondary | ICD-10-CM

## 2018-07-05 DIAGNOSIS — R601 Generalized edema: Secondary | ICD-10-CM

## 2018-07-05 NOTE — Therapy (Addendum)
[place], [place], [address] Phone: [phone]   Fax:  [phone]  Physical Therapy Treatment  Patient Details  Name: Christy [name] MRN: [id number] Date of Birth: 02/21/1956 Referring Provider: Dr Frederik Pear   Encounter Date: 07/05/2018  PT End of Session - 07/05/18 0908    Visit Number  2    Number of Visits  24    Date for PT Re-Evaluation  09/23/18    PT Start Time  0805    PT Stop Time  0910    PT Time Calculation (min)  65 min    Equipment Utilized During Treatment  Gait belt    Activity Tolerance  Patient tolerated treatment well    Behavior During Therapy  [facility] for tasks assessed/performed       Past Medical History:  Diagnosis Date  . ADD (attention deficit disorder)   . Allergy    rhinitis  . Anemia   . Arthritis    osteo  . Breast cancer (HCC)    Breast , skin - basal   . Complication of anesthesia    gastric bypass [facility] ,  acetyl succ-, hard time awaking, placed back on vent for a couiple hour  . Dyspnea   . GERD (gastroesophageal reflux disease)   . History of hiatal hernia   . History of kidney stones    passed  . Hypothyroid   . Internal hemorrhoids   . Obesity   . Pneumonia   . Tubular adenoma of colon     Past Surgical History:  Procedure Laterality Date  . BREAST LUMPECTOMY Right   . CESAREAN SECTION     x 2  . CHOLECYSTECTOMY    . COLONOSCOPY    . ESOPHAGOGASTRODUODENOSCOPY    . GASTRIC BYPASS    . JOINT REPLACEMENT    . KNEE ARTHROSCOPY Right    x 3  . LAPAROSCOPIC APPENDECTOMY N/A 04/03/2015   Procedure: APPENDECTOMY LAPAROSCOPIC;  Surgeon: Autumn Messing III, MD;  Location: [facility];  Service: General;  Laterality: N/A;  . LITHOTRIPSY     x3  . MASTECTOMY Bilateral    with lymph node dissection  bil reconstruction 2009  . TOTAL KNEE ARTHROPLASTY Right 12/18/2013   Procedure: TOTAL KNEE ARTHROPLASTY;  Surgeon: Kerin Salen, MD;   Location: [place];  Service: Orthopedics;  Laterality: Right;  . TOTAL KNEE ARTHROPLASTY Left 06/29/2018  . TOTAL KNEE ARTHROPLASTY Left 06/29/2018   Procedure: LEFT TOTAL KNEE ARTHROPLASTY;  Surgeon: Frederik Pear, MD;  Location: [place];  Service: Orthopedics;  Laterality: Left;  Marland Kitchen VAGINAL HYSTERECTOMY      There were no vitals filed for this visit.  Subjective Assessment - 07/05/18 0824    Subjective  Pt relays she is taking less pain medicine but is still feeling nausua.     Pertinent History  Rt knee injury 1974 - several surgerires on Rt knee creating stress on the Lt; Rt TKA 2014; denies any other medical problems; osteopenia; bilat mastectomy 2009     Pain Score  2     Pain Location  Knee    Pain Orientation  Left    Pain Descriptors / Indicators  Squeezing    Pain Type  Surgical pain    Pain Onset  More than a month ago    Pain Frequency  Intermittent    Aggravating Factors   moving her knee or WB    Pain Relieving Factors  ice, rest, meds         [facility] PT Assessment - 07/05/18 0001      AROM   Left Knee Extension  -5    Left Knee Flexion  90      PROM   Overall PROM Comments  Lt kneeFlexon 105        Gait: 160 ft with SPC with supervision, with decreased hip/knee flexion gait pattern Stretching: Hamstring stretch: Lt 3 X 30 sec Heelslides 5 sec X 20 Nu step for ROM and endurance: 7 min Strengthening: LAQ for strength 2X10 hold 5 sec Quad sets 2X10 hold 5 sec SLR 2 sets of 5 SL clams 2X10     Vasopnuematic for edema and cyrotherapy for pain/edeam: 15 min low pressure, 34 deg  combined with E-stim     [facility] Adult PT Treatment/Exercise - 07/05/18 0906      Electrical Stimulation   Electrical Stimulation Location  Lt knee    Electrical Stimulation Goals  Pain;Tone             PT Education - 07/05/18 0908    Education Details  HEP review and EDU on new exercises and gait training with SPC    Person(s) Educated  Patient    Methods   Explanation;Demonstration;Tactile cues;Verbal cues    Comprehension  Verbalized understanding;Returned demonstration       PT Short Term Goals - 07/05/18 0914      PT SHORT TERM GOAL #1   Title  Patient independent in initial HEP 08/12/18    Time  6    Period  Weeks    Status  On-going      PT SHORT TERM GOAL #2   Title  Increase AROM Rt knee to (-) 2 deg to 105 deg 08/13/18    Time  6    Status  Partially Met      PT SHORT TERM GOAL #3   Title  Progress to single point cane for ambulation with good gait pattern 08/13/18    Time  6    Period  Weeks    Status  Partially Met        PT Long Term Goals - 07/01/18 1612      PT LONG TERM GOAL #1   Title  5-/5 to 5/5 strength  LE 09/23/18    Time  12    Period  Weeks    Status  New      PT LONG TERM GOAL #2   Title  AROM Lt knee 0 to 115 degrees 09/23/18    Time  12    Period  Weeks    Status  New      PT LONG TERM GOAL #3   Title  independent gait without assistive device for community distances 09/23/18    Time  12    Period  Weeks    Status  New      PT LONG TERM GOAL #4   Title  Independent in HEP 09/23/18    Time  12    Period  Weeks    Status  New      PT LONG TERM GOAL #5   Title  Improve FOTO to </= 60% limitation 09/23/18    Time  12    Period  Weeks    Status  New            Plan - 07/05/18 0909    Clinical Impression Statement  Pt was able to  progress today with threx for LE stretching and strenghtening with good tolerance and good return demonstraton. She is making great progress thus far and was able to ambulate with [facility] today with good technique. She was treated with MT for ROM and pain as well as TENS and vasoconstriciton to reduce pain and edema post tx.     Rehab Potential  Good    PT Frequency  2x / week    PT Duration  12 weeks    PT Treatment/Interventions  Patient/family education;ADLs/Self Care Home Management;Cryotherapy;Electrical Stimulation;Iontophoresis 58m/ml Dexamethasone;Moist  Heat;Ultrasound;Gait training;Stair training;Functional mobility training;Therapeutic activities;Therapeutic exercise;Balance training;Neuromuscular re-education;Scar mobilization;Manual techniques;Dry needling    PT Next Visit Plan  Progress ROM and strengthening and gait as tolerated, modalites PRN       Patient will benefit from skilled therapeutic intervention in order to improve the following deficits and impairments:  Abnormal gait, Decreased balance, Decreased mobility, Decreased range of motion, Decreased strength, Increased edema, Pain  Visit Diagnosis: Acute pain of left knee  Muscle weakness (generalized)  Stiffness of left knee, not elsewhere classified  Other abnormalities of gait and mobility     Problem List Patient Active Problem List   Diagnosis Date Noted  . Primary osteoarthritis of left knee 06/29/2018  . Degenerative arthritis of left knee 06/07/2018  . S/P TKR (total knee replacement) using cement 03/12/2016  . Hypothyroidism 03/12/2016  . Solar lentigo 03/12/2016  . Attention deficit hyperactivity disorder (ADHD), predominantly inattentive type 07/04/2013  . Iron deficiency anemia 04/05/2013  . H/O gastric bypass 03/06/2012  . Malignant neoplasm of upper-outer quadrant of right breast in female, estrogen receptor positive ([place] 03/04/2012  . Osteopenia 03/04/2012    BDebbe Odea PT, DPT 07/05/2018, 9:16 AM  [facility] [address]Phone: [phone]  Fax:  [phone] Name: NVELMER BROADFOOTMRN: [id number] of Birth: 530-Nov-1957

## 2018-07-07 ENCOUNTER — Ambulatory Visit (INDEPENDENT_AMBULATORY_CARE_PROVIDER_SITE_OTHER): Payer: PRIVATE HEALTH INSURANCE | Admitting: Physical Therapy

## 2018-07-07 DIAGNOSIS — M25662 Stiffness of left knee, not elsewhere classified: Secondary | ICD-10-CM

## 2018-07-07 DIAGNOSIS — R601 Generalized edema: Secondary | ICD-10-CM | POA: Diagnosis not present

## 2018-07-07 DIAGNOSIS — M25562 Pain in left knee: Secondary | ICD-10-CM | POA: Diagnosis not present

## 2018-07-07 DIAGNOSIS — M6281 Muscle weakness (generalized): Secondary | ICD-10-CM

## 2018-07-07 NOTE — Therapy (Signed)
[place], [place], [address] Phone: [phone]   Fax:  [phone]  Physical Therapy Treatment  Patient Details  Name: Christy [name] MRN: [id number] Date of Birth: Jun 07, 1956 Referring Provider: Dr. Frederik Pear   Encounter Date: 07/07/2018  PT End of Session - 07/07/18 1147    Visit Number  3    Number of Visits  24    Date for PT Re-Evaluation  09/23/18    PT Start Time  1148    PT Stop Time  1245    PT Time Calculation (min)  57 min    Activity Tolerance  Patient tolerated treatment well    Behavior During Therapy  [facility] for tasks assessed/performed       Past Medical History:  Diagnosis Date  . ADD (attention deficit disorder)   . Allergy    rhinitis  . Anemia   . Arthritis    osteo  . Breast cancer (HCC)    Breast , skin - basal   . Complication of anesthesia    gastric bypass [facility] ,  acetyl succ-, hard time awaking, placed back on vent for a couiple hour  . Dyspnea   . GERD (gastroesophageal reflux disease)   . History of hiatal hernia   . History of kidney stones    passed  . Hypothyroid   . Internal hemorrhoids   . Obesity   . Pneumonia   . Tubular adenoma of colon     Past Surgical History:  Procedure Laterality Date  . BREAST LUMPECTOMY Right   . CESAREAN SECTION     x 2  . CHOLECYSTECTOMY    . COLONOSCOPY    . ESOPHAGOGASTRODUODENOSCOPY    . GASTRIC BYPASS    . JOINT REPLACEMENT    . KNEE ARTHROSCOPY Right    x 3  . LAPAROSCOPIC APPENDECTOMY N/A 04/03/2015   Procedure: APPENDECTOMY LAPAROSCOPIC;  Surgeon: Autumn Messing III, MD;  [facility];  Service: General;  Laterality: N/A;  . LITHOTRIPSY     x3  . MASTECTOMY Bilateral    with lymph node dissection  bil reconstruction 2009  . TOTAL KNEE ARTHROPLASTY Right 12/18/2013   Procedure: TOTAL KNEE ARTHROPLASTY;  Surgeon: Kerin Salen, MD;  Location: [place];  Service: Orthopedics;  Laterality:  Right;  . TOTAL KNEE ARTHROPLASTY Left 06/29/2018  . TOTAL KNEE ARTHROPLASTY Left 06/29/2018   Procedure: LEFT TOTAL KNEE ARTHROPLASTY;  Surgeon: Frederik Pear, MD;  Location: [place];  Service: Orthopedics;  Laterality: Left;  Marland Kitchen VAGINAL HYSTERECTOMY      There were no vitals filed for this visit.  Subjective Assessment - 07/07/18 1154    Subjective  Pt reports she was very sore yesterday, maybe had done too much.  she brought her [facility] with her; ambulates into therapy with RW.  She has an upcoming appt with surgeon on 7/16.     Patient Stated Goals  get the Lt knee working like the Rt     Currently in Pain?  Yes    Pain Score  5     Pain Location  Knee    Pain Orientation  Left    Pain Descriptors / Indicators  Squeezing    Aggravating Factors   moving her knee.     Pain Relieving Factors  ice, rest, meds         [facility] PT Assessment - 07/07/18 0001      Assessment   Medical  Diagnosis  Lt TKA    Referring Provider  Dr. Frederik Pear    Onset Date/Surgical Date  06/29/18    Hand Dominance  Right    Next MD Visit  07/12/18      AROM   Left Knee Flexion  100 with seated scoot       [facility] Adult PT Treatment/Exercise - 07/07/18 0001      Self-Care   Self-Care  Other Self-Care Comments    Other Self-Care Comments   pt educated in self massagewith roller stick to LE to decrease fascial tightness.       Knee/Hip Exercises: Aerobic   Nustep  L5: 8 min (arms/legs) for ROM and endurance.       Knee/Hip Exercises: Standing   Gait Training  240 ft with SPC: VC for decreasing Lt step length, increased toe off, increased knee flexion during Lt swing through phase; improved quality with cues.      Knee/Hip Exercises: Seated   Long Arc Quad  Left;Strengthening;1 set;10 reps 5 sec hold    Other Seated Knee/Hip Exercises  seated scoots for Lt knee ROM x 10 reps     Sit to Sand  15 reps;without UE support elevated table, cue so for technique      Knee/Hip Exercises: Prone   Prone Knee Hang  1  minute    Other Prone Exercises  quad set with toes tucked x 5 sec x 10 reps      Electrical Stimulation   Electrical Stimulation Location  Lt knee    Electrical Stimulation Action  IFC    Electrical Stimulation Parameters  to tolerance    Electrical Stimulation Goals  Pain      Vasopneumatic   Number Minutes Vasopneumatic   15 minutes    Vasopnuematic Location   Knee Lt    Vasopneumatic Pressure  Low    Vasopneumatic Temperature   34 deg       Manual Therapy   Manual Therapy  Taping    Manual therapy comments  2 squid shaped pieces of tape applied to posterior Lt calf to decrease bruising and edema             PT Education - 07/07/18 1230    Education Details  ktape info    Person(s) Educated  Patient    Methods  Explanation;Handout    Comprehension  Verbalized understanding       PT Short Term Goals - 07/05/18 0914      PT SHORT TERM GOAL #1   Title  Patient independent in initial HEP 08/12/18    Time  6    Period  Weeks    Status  On-going      PT SHORT TERM GOAL #2   Title  Increase AROM Rt knee to (-) 2 deg to 105 deg 08/13/18    Time  6    Status  Partially Met      PT SHORT TERM GOAL #3   Title  Progress to single point cane for ambulation with good gait pattern 08/13/18    Time  6    Period  Weeks    Status  Partially Met        PT Long Term Goals - 07/07/18 1259      PT LONG TERM GOAL #1   Title  5-/5 to 5/5 strength  LE 09/23/18    Time  12    Period  Weeks    Status  On-going  PT LONG TERM GOAL #2   Title  AROM Lt knee 0 to 115 degrees 09/23/18    Time  12    Period  Weeks    Status  On-going      PT LONG TERM GOAL #3   Title  independent gait without assistive device for community distances 09/23/18    Time  12    Period  Weeks    Status  On-going      PT LONG TERM GOAL #4   Title  Independent in HEP 09/23/18    Time  12    Period  Weeks    Status  On-going      PT LONG TERM GOAL #5   Title  Improve FOTO to </= 60%  limitation 09/23/18    Time  12    Period  Weeks    Status  On-going            Plan - 07/07/18 1239    Clinical Impression Statement  Torryn gained 10 deg in Lt knee flexion ROM today.  She had difficulty tolerating sit to/from stand.  She was able to transition to a [facility] without any difficulty.  Progressing well towards established therapy goals.     Rehab Potential  Good    PT Frequency  2x / week    PT Duration  12 weeks    PT Treatment/Interventions  Patient/family education;ADLs/Self Care Home Management;Cryotherapy;Electrical Stimulation;Iontophoresis 11m/ml Dexamethasone;Moist Heat;Ultrasound;Gait training;Stair training;Functional mobility training;Therapeutic activities;Therapeutic exercise;Balance training;Neuromuscular re-education;Scar mobilization;Manual techniques;Dry needling    PT Next Visit Plan  continue progressive Lt knee ROM, strengthening and gait.         Patient will benefit from skilled therapeutic intervention in order to improve the following deficits and impairments:  Abnormal gait, Decreased balance, Decreased mobility, Decreased range of motion, Decreased strength, Increased edema, Pain  Visit Diagnosis: Acute pain of left knee  Muscle weakness (generalized)  Stiffness of left knee, not elsewhere classified  Generalized edema     Problem List Patient Active Problem List   Diagnosis Date Noted  . Primary osteoarthritis of left knee 06/29/2018  . Degenerative arthritis of left knee 06/07/2018  . S/P TKR (total knee replacement) using cement 03/12/2016  . Hypothyroidism 03/12/2016  . Solar lentigo 03/12/2016  . Attention deficit hyperactivity disorder (ADHD), predominantly inattentive type 07/04/2013  . Iron deficiency anemia 04/05/2013  . H/O gastric bypass 03/06/2012  . Malignant neoplasm of upper-outer quadrant of right breast in female, estrogen receptor positive ([place] 03/04/2012  . Osteopenia 03/04/2012   JKerin Perna  PTA 07/07/18 1:04 PM  [place] [address]Phone: [phone]  Fax:  [phone] Name: Christy MARCHANTMRN: [id number] of Birth: [birth year]

## 2018-07-07 NOTE — Patient Instructions (Signed)

## 2018-07-12 ENCOUNTER — Ambulatory Visit (INDEPENDENT_AMBULATORY_CARE_PROVIDER_SITE_OTHER): Payer: PRIVATE HEALTH INSURANCE | Admitting: Rehabilitative and Restorative Service Providers"

## 2018-07-12 ENCOUNTER — Encounter: Payer: Self-pay | Admitting: Rehabilitative and Restorative Service Providers"

## 2018-07-12 DIAGNOSIS — R29898 Other symptoms and signs involving the musculoskeletal system: Secondary | ICD-10-CM

## 2018-07-12 DIAGNOSIS — R2689 Other abnormalities of gait and mobility: Secondary | ICD-10-CM

## 2018-07-12 DIAGNOSIS — R601 Generalized edema: Secondary | ICD-10-CM

## 2018-07-12 DIAGNOSIS — M25662 Stiffness of left knee, not elsewhere classified: Secondary | ICD-10-CM | POA: Diagnosis not present

## 2018-07-12 DIAGNOSIS — M25562 Pain in left knee: Secondary | ICD-10-CM

## 2018-07-12 DIAGNOSIS — M6281 Muscle weakness (generalized): Secondary | ICD-10-CM

## 2018-07-12 NOTE — Patient Instructions (Addendum)
Scapula Adduction With Pectoralis Stretch: Low - Standing   Shoulders at 45 hands even with shoulders, keeping weight through legs, shift weight forward until you feel pull or stretch through the front of your chest. Hold _30__ seconds. Do _3__ times, _2-4__ times per day.   Scapula Adduction With Pectoralis Stretch: Mid-Range - Standing   Shoulders at 90 elbows even with shoulders, keeping weight through legs, shift weight forward until you feel pull or strength through the front of your chest. Hold __30_ seconds. Do _3__ times, __2-4_ times per day.   Scapula Adduction With Pectoralis Stretch: High - Standing   Shoulders at 120 hands up high on the doorway, keeping weight on feet, shift weight forward until you feel pull or stretch through the front of your chest. Hold _30__ seconds. Do _3__ times, _2-3__ times per day.   Axial Extension (Chin Tuck)    Pull chin in and lengthen back of neck. Hold __5__ seconds while counting out loud. Repeat __10__ times. Do __several__ sessions per day.  Shoulder Blade Squeeze   Can use noodle along spine for tactile cue  Rotate shoulders back, then squeeze shoulder blades down and back. Hold 10 sec Repeat _10__ times. Do __several__ sessions per day.  Upper Back Strength: Lower Trapezius / Rotator Cuff " L's "     Arms in waitress pose, palms up. Press hands back and slide shoulder blades down. Hold for __5__ seconds. Repeat _10___ times. 1-2 times per day.    Scapular Retraction: Elbow Flexion (Standing)  "W's"     With elbows bent to 90, pinch shoulder blades together and rotate arms out, keeping elbows bent. Repeat __10__ times per set. Do __1-2__ sets per session. Do _several ___ sessions per day.  SUPINE Tips A Snow angel     Being in the supine position means to be lying on the back. Lying on the back is the position of least compression on the bones and discs of the spine, and helps to re-align the natural curves  of the back. No pain - work toward 2-5 min can bend elbows if you need to release stretch for a few seconds   [place] [facility] [place], [place] [address]  [phone] (office) [phone] (fax)

## 2018-07-12 NOTE — Therapy (Signed)
[place], [place], [address] Phone: [phone]   Fax:  [phone]  Physical Therapy Treatment  Patient Details  Name: Christy [name] MRN: [id number] Date of Birth: 09-19-1956 Referring Provider: Dr Frederik Pear    Encounter Date: 07/12/2018  PT End of Session - 07/12/18 0932    Visit Number  4    Number of Visits  24    Date for PT Re-Evaluation  09/23/18    PT Start Time  0921    PT Stop Time  1025    PT Time Calculation (min)  64 min    Activity Tolerance  Patient tolerated treatment well       Past Medical History:  Diagnosis Date  . ADD (attention deficit disorder)   . Allergy    rhinitis  . Anemia   . Arthritis    osteo  . Breast cancer (HCC)    Breast , skin - basal   . Complication of anesthesia    gastric bypass [facility] ,  acetyl succ-, hard time awaking, placed back on vent for a couiple hour  . Dyspnea   . GERD (gastroesophageal reflux disease)   . History of hiatal hernia   . History of kidney stones    passed  . Hypothyroid   . Internal hemorrhoids   . Obesity   . Pneumonia   . Tubular adenoma of colon     Past Surgical History:  Procedure Laterality Date  . BREAST LUMPECTOMY Right   . CESAREAN SECTION     x 2  . CHOLECYSTECTOMY    . COLONOSCOPY    . ESOPHAGOGASTRODUODENOSCOPY    . GASTRIC BYPASS    . JOINT REPLACEMENT    . KNEE ARTHROSCOPY Right    x 3  . LAPAROSCOPIC APPENDECTOMY N/A 04/03/2015   Procedure: APPENDECTOMY LAPAROSCOPIC;  Surgeon: Autumn Messing III, MD;  Location: [facility];  Service: General;  Laterality: N/A;  . LITHOTRIPSY     x3  . MASTECTOMY Bilateral    with lymph node dissection  bil reconstruction 2009  . TOTAL KNEE ARTHROPLASTY Right 12/18/2013   Procedure: TOTAL KNEE ARTHROPLASTY;  Surgeon: Kerin Salen, MD;  Location: [place];  Service: Orthopedics;  Laterality: Right;  . TOTAL KNEE ARTHROPLASTY Left 06/29/2018  . TOTAL KNEE  ARTHROPLASTY Left 06/29/2018   Procedure: LEFT TOTAL KNEE ARTHROPLASTY;  Surgeon: Frederik Pear, MD;  Location: [place];  Service: Orthopedics;  Laterality: Left;  Marland Kitchen VAGINAL HYSTERECTOMY      There were no vitals filed for this visit.  Subjective Assessment - 07/12/18 0932    Subjective  Working on exercise at home and walked without her cane around the house yesterday most of the day. Knee is gradually improving. Sees MD today. She reports that she is concerned with kyphosis and some pain/discomfort in the mid back area. Symptoms had been present for several years on an intermittent basis. She does have breast implants following breast cancer.     Currently in Pain?  Yes    Pain Score  3     Pain Location  Knee    Pain Orientation  Left    Pain Descriptors / Indicators  Tightness    Pain Type  Surgical pain;Chronic pain    Pain Onset  More than a month ago    Pain Frequency  Intermittent    Multiple Pain Sites  -- no pain in the mid back today - worse  at the end of the day          [facility] PT Assessment - 07/12/18 0001      Assessment   Medical Diagnosis  Lt TKA/bilateral thoracic pain     Referring Provider  Dr Frederik Pear     Onset Date/Surgical Date  06/29/18    Hand Dominance  Right    Next MD Visit  07/12/18      AROM   Left Knee Extension  -4    Left Knee Flexion  110 supine       PROM   Overall PROM Comments  end range tightness bilat with shoulder elevation       Strength   Overall Strength Comments  bilatr UE's 5/5 except middle trap 4+/5 and lower trap 4/5       Palpation   Palpation comment  muscular tightness to palpation through the pecs and anterior chest        Ambulation/Gait   Ambulation/Gait  Yes    Ambulation/Gait Assistance  7: Independent    Ambulation Distance (Feet)  160 Feet    Assistive device  Straight cane    Gait Pattern  Decreased hip/knee flexion - left                           PT Education - 07/12/18 0955    Education  Details  HEP     Person(s) Educated  Patient    Methods  Explanation;Demonstration;Tactile cues;Verbal cues;Handout    Comprehension  Verbalized understanding;Returned demonstration;Verbal cues required;Tactile cues required       PT Short Term Goals - 07/12/18 1011      PT SHORT TERM GOAL #1   Title  Patient independent in initial HEP 08/12/18    Time  6    Period  Weeks    Status  Achieved      PT SHORT TERM GOAL #2   Title  Increase AROM Rt knee to (-) 2 deg to 105 deg 08/13/18    Time  6    Period  Weeks    Status  Partially Met      PT SHORT TERM GOAL #3   Title  Progress to single point cane for ambulation with good gait pattern 08/13/18    Time  6    Period  Weeks    Status  Partially Met        PT Long Term Goals - 07/12/18 1011      PT LONG TERM GOAL #1   Title  5-/5 to 5/5 strength  LE 09/23/18    Time  12    Period  Weeks    Status  On-going      PT LONG TERM GOAL #2   Title  AROM Lt knee 0 to 115 degrees 09/23/18    Time  12    Period  Weeks    Status  On-going      PT LONG TERM GOAL #3   Title  independent gait without assistive device for community distances 09/23/18    Time  12    Period  Weeks    Status  On-going      PT LONG TERM GOAL #4   Title  Independent in HEP 09/23/18    Time  12    Period  Weeks    Status  On-going      PT LONG TERM GOAL #5   Title  Improve  FOTO to </= 60% limitation 09/23/18    Time  12    Period  Weeks    Status  On-going      PT LONG TERM GOAL #6   Title  Improve posture and alignment with patient to demonstrate impeoved upright posture with posterior shoulder girdle engaged 09/23/18    Time  12    Period  Weeks    Status  New      PT LONG TERM GOAL #7   Title  Independent in HEP for stretching/strengthening to improve thoracic alignment and muscular balance therefore decreasing thoracic pain and discomfort 09/23/18    Time  12    Period  Weeks    Status  New            Plan - 07/12/18 1470    Clinical  Impression Statement  Continued improvement in ROM; gait and functional activities. She is progressing well with knee rehab. Evaluation of thoracic posture and dysfunction today. Patient has head forward posture; increased thoracic kyphosis; decreaed shoulder ROM; decreased posterior shoulder girdle strength; intermittent pain in thoracic spine. Patient will benefit form PT to address problems identified.     History and Personal Factors relevant to plan of care:  Rt TKA; bilat mastectomies with breast reconstruction with sbdominal flap     Rehab Potential  Good    PT Frequency  2x / week    PT Duration  12 weeks    PT Treatment/Interventions  Patient/family education;ADLs/Self Care Home Management;Cryotherapy;Electrical Stimulation;Iontophoresis 19m/ml Dexamethasone;Moist Heat;Ultrasound;Gait training;Stair training;Functional mobility training;Therapeutic activities;Therapeutic exercise;Balance training;Neuromuscular re-education;Scar mobilization;Manual techniques;Dry needling    PT Next Visit Plan  continue progressive Lt knee ROM, strengthening and gait; postural correction; stretching pecs; strengthening posterior shouldewr girdle musculature      Consulted and Agree with Plan of Care  Patient       Patient will benefit from skilled therapeutic intervention in order to improve the following deficits and impairments:  Abnormal gait, Decreased balance, Decreased mobility, Decreased range of motion, Decreased strength, Increased edema, Pain, Postural dysfunction, Improper body mechanics  Visit Diagnosis: Acute pain of left knee  Muscle weakness (generalized)  Stiffness of left knee, not elsewhere classified  Generalized edema  Other abnormalities of gait and mobility     Problem List Patient Active Problem List   Diagnosis Date Noted  . Primary osteoarthritis of left knee 06/29/2018  . Degenerative arthritis of left knee 06/07/2018  . S/P TKR (total knee replacement) using cement  03/12/2016  . Hypothyroidism 03/12/2016  . Solar lentigo 03/12/2016  . Attention deficit hyperactivity disorder (ADHD), predominantly inattentive type 07/04/2013  . Iron deficiency anemia 04/05/2013  . H/O gastric bypass 03/06/2012  . Malignant neoplasm of upper-outer quadrant of right breast in female, estrogen receptor positive [place] 03/04/2012  . Osteopenia 03/04/2012    Kiauna Zywicki PNilda SimmerPT, MPH  07/12/2018, 10:15 AM  [facility] [address]Phone: [phone]  Fax:  [phone] Name: Christy LOISELLEMRN: [id number] of Birth: 5July 29, 1957

## 2018-07-15 ENCOUNTER — Ambulatory Visit (INDEPENDENT_AMBULATORY_CARE_PROVIDER_SITE_OTHER): Payer: PRIVATE HEALTH INSURANCE | Admitting: Physical Therapy

## 2018-07-15 DIAGNOSIS — R601 Generalized edema: Secondary | ICD-10-CM | POA: Diagnosis not present

## 2018-07-15 DIAGNOSIS — M25662 Stiffness of left knee, not elsewhere classified: Secondary | ICD-10-CM | POA: Diagnosis not present

## 2018-07-15 DIAGNOSIS — R2689 Other abnormalities of gait and mobility: Secondary | ICD-10-CM | POA: Diagnosis not present

## 2018-07-15 DIAGNOSIS — M25562 Pain in left knee: Secondary | ICD-10-CM

## 2018-07-15 DIAGNOSIS — R29898 Other symptoms and signs involving the musculoskeletal system: Secondary | ICD-10-CM

## 2018-07-15 DIAGNOSIS — M6281 Muscle weakness (generalized): Secondary | ICD-10-CM | POA: Diagnosis not present

## 2018-07-15 NOTE — Therapy (Signed)
[place], [place], [address] Phone: [phone]   Fax:  [phone]  Physical Therapy Treatment  Patient Details  Name: Christy [name] MRN: [id number] Date of Birth: Feb 16, 1956 Referring Provider: Dr. Frederik Pear   Encounter Date: 07/15/2018  PT End of Session - 07/15/18 1105    Visit Number  5    Number of Visits  24    Date for PT Re-Evaluation  09/23/18    PT Start Time  1101    PT Stop Time  1202    PT Time Calculation (min)  61 min    Activity Tolerance  Patient tolerated treatment well    Behavior During Therapy  [facility] for tasks assessed/performed       Past Medical History:  Diagnosis Date  . ADD (attention deficit disorder)   . Allergy    rhinitis  . Anemia   . Arthritis    osteo  . Breast cancer (HCC)    Breast , skin - basal   . Complication of anesthesia    gastric bypass [facility] ,  acetyl succ-, hard time awaking, placed back on vent for a couiple hour  . Dyspnea   . GERD (gastroesophageal reflux disease)   . History of hiatal hernia   . History of kidney stones    passed  . Hypothyroid   . Internal hemorrhoids   . Obesity   . Pneumonia   . Tubular adenoma of colon     Past Surgical History:  Procedure Laterality Date  . BREAST LUMPECTOMY Right   . CESAREAN SECTION     x 2  . CHOLECYSTECTOMY    . COLONOSCOPY    . ESOPHAGOGASTRODUODENOSCOPY    . GASTRIC BYPASS    . JOINT REPLACEMENT    . KNEE ARTHROSCOPY Right    x 3  . LAPAROSCOPIC APPENDECTOMY N/A 04/03/2015   Procedure: APPENDECTOMY LAPAROSCOPIC;  Surgeon: Autumn Messing III, MD;  Location: [facility];  Service: General;  Laterality: N/A;  . LITHOTRIPSY     x3  . MASTECTOMY Bilateral    with lymph node dissection  bil reconstruction 2009  . TOTAL KNEE ARTHROPLASTY Right 12/18/2013   Procedure: TOTAL KNEE ARTHROPLASTY;  Surgeon: Kerin Salen, MD;  Location: [place];  Service: Orthopedics;  Laterality:  Right;  . TOTAL KNEE ARTHROPLASTY Left 06/29/2018  . TOTAL KNEE ARTHROPLASTY Left 06/29/2018   Procedure: LEFT TOTAL KNEE ARTHROPLASTY;  Surgeon: Frederik Pear, MD;  Location: [place];  Service: Orthopedics;  Laterality: Left;  Marland Kitchen VAGINAL HYSTERECTOMY      There were no vitals filed for this visit.  Subjective Assessment - 07/15/18 1105    Subjective  Pt reports she is a little sore today from overdoing it yesterday.  She was refinishing a table yesterday.  She states she can go up and down stairs a little easier.     Patient Stated Goals  get the Lt knee working like the Rt     Currently in Pain?  Yes    Pain Score  7     Pain Location  Knee    Pain Orientation  Left    Pain Descriptors / Indicators  Tightness;Sore    Aggravating Factors   fear of moving knee too far, tensing things up.    Pain Relieving Factors  ice, rest    Multiple Pain Sites  Yes    Pain Score  4    Pain Location  Back    Pain Orientation  Lower;Left    Pain Descriptors / Indicators  Aching    Aggravating Factors   bending over.     Pain Relieving Factors  stretching          [facility] PT Assessment - 07/15/18 0001      Assessment   Medical Diagnosis  Lt TKA/bilateral thoracic pain     Referring Provider  Dr. Frederik Pear    Onset Date/Surgical Date  06/29/18    Hand Dominance  Right    Next MD Visit  Aug 2019       [facility] Adult PT Treatment/Exercise - 07/15/18 0001      Knee/Hip Exercises: Stretches   Passive Hamstring Stretch  Left;30 seconds;3 reps    Quad Stretch  Left;30 seconds;3 reps    Gastroc Stretch  Left;4 reps;30 seconds    Other Knee/Hip Stretches  Lt forward lunge with foot on 13" step x 15 sec hold, followed by hamstring stretch with foot on step x 5 reps limited tolerance for hamstring stretch      Knee/Hip Exercises: Aerobic   Nustep  L5: 8 min (arms/legs) for ROM and endurance.  PTA present to discuss progress and monitor      Knee/Hip Exercises: Standing   Lateral Step Up  Left;1 set;10  reps;Hand Hold: 2;Step Height: 6"    Step Down  Right;1 set;10 reps;Hand Hold: 2;Limitations 3" step, BUE support to no support    Step Down Limitations  2nd set of 12 reps on 6" set.     SLS  Rt/Lt SLS x 3 reps - up to 15 sec each     Other Standing Knee Exercises  tandem stance without UE support, 30 sec each leg forward, repeated with horiz head turns then eyes closed.       Knee/Hip Exercises: Seated   Sit to Sand  5 reps;without UE support feet even and Lt foot back      Shoulder Exercises: Standing   Other Standing Exercises  verbally reviewed L's, W's, and scap retraction - pt returned demo with 2 reps each to verify form.       Shoulder Exercises: Stretch   Other Shoulder Stretches  3 position door stretch 30 sec x 3 reps each position      Modalities   Modalities  Electrical Stimulation;Cryotherapy;Moist Heat      Moist Heat Therapy   Number Minutes Moist Heat  15 Minutes    Moist Heat Location  -- Lt hamstring and prox quad       Cryotherapy   Number Minutes Cryotherapy  15 Minutes    Cryotherapy Location  Knee Lt anterior    Type of Cryotherapy  Ice pack      Electrical Stimulation   Electrical Stimulation Location  Lt knee    Electrical Stimulation Action  IFC    Electrical Stimulation Parameters  to tolerance     Electrical Stimulation Goals  Pain      Manual Therapy   Manual Therapy  Soft tissue mobilization    Soft tissue mobilization  IASTM and STM to Lt quad to decrease pain, fascial restriction and improve ROM                PT Short Term Goals - 07/12/18 1011      PT SHORT TERM GOAL #1   Title  Patient independent in initial HEP 08/12/18    Time  6    Period  Weeks  Status  Achieved      PT SHORT TERM GOAL #2   Title  Increase AROM Rt knee to (-) 2 deg to 105 deg 08/13/18    Time  6    Period  Weeks    Status  Partially Met      PT SHORT TERM GOAL #3   Title  Progress to single point cane for ambulation with good gait pattern 08/13/18     Time  6    Period  Weeks    Status  Partially Met        PT Long Term Goals - 07/12/18 1011      PT LONG TERM GOAL #1   Title  5-/5 to 5/5 strength  LE 09/23/18    Time  12    Period  Weeks    Status  On-going      PT LONG TERM GOAL #2   Title  AROM Lt knee 0 to 115 degrees 09/23/18    Time  12    Period  Weeks    Status  On-going      PT LONG TERM GOAL #3   Title  independent gait without assistive device for community distances 09/23/18    Time  12    Period  Weeks    Status  On-going      PT LONG TERM GOAL #4   Title  Independent in HEP 09/23/18    Time  12    Period  Weeks    Status  On-going      PT LONG TERM GOAL #5   Title  Improve FOTO to </= 60% limitation 09/23/18    Time  12    Period  Weeks    Status  On-going      PT LONG TERM GOAL #6   Title  Improve posture and alignment with patient to demonstrate impeoved upright posture with posterior shoulder girdle engaged 09/23/18    Time  12    Period  Weeks    Status  New      PT LONG TERM GOAL #7   Title  Independent in HEP for stretching/strengthening to improve thoracic alignment and muscular balance therefore decreasing thoracic pain and discomfort 09/23/18    Time  12    Period  Weeks    Status  New            Plan - 07/15/18 1311    Clinical Impression Statement  Pt now ambulating into therapy with SPC.  She had decreased tolerance for standing hamstring stretch; improved in hooklying position.  She tolerated all other exercises well.  Progressing well towards established goals.     Rehab Potential  Good    PT Frequency  2x / week    PT Duration  12 weeks    PT Treatment/Interventions  Patient/family education;ADLs/Self Care Home Management;Cryotherapy;Electrical Stimulation;Iontophoresis 20m/ml Dexamethasone;Moist Heat;Ultrasound;Gait training;Stair training;Functional mobility training;Therapeutic activities;Therapeutic exercise;Balance training;Neuromuscular re-education;Scar  mobilization;Manual techniques;Dry needling    PT Next Visit Plan  continue progressive Lt knee ROM, strengthening and gait; postural correction; stretching pecs; strengthening posterior shoulder girdle musculature      Consulted and Agree with Plan of Care  Patient       Patient will benefit from skilled therapeutic intervention in order to improve the following deficits and impairments:  Abnormal gait, Decreased balance, Decreased mobility, Decreased range of motion, Decreased strength, Increased edema, Pain, Postural dysfunction, Improper body mechanics  Visit Diagnosis: Acute pain of left knee  Muscle weakness (generalized)  Stiffness of left knee, not elsewhere classified  Generalized edema  Other abnormalities of gait and mobility     Problem List Patient Active Problem List   Diagnosis Date Noted  . Primary osteoarthritis of left knee 06/29/2018  . Degenerative arthritis of left knee 06/07/2018  . S/P TKR (total knee replacement) using cement 03/12/2016  . Hypothyroidism 03/12/2016  . Solar lentigo 03/12/2016  . Attention deficit hyperactivity disorder (ADHD), predominantly inattentive type 07/04/2013  . Iron deficiency anemia 04/05/2013  . H/O gastric bypass 03/06/2012  . Malignant neoplasm of upper-outer quadrant of right breast in female, estrogen receptor positive ([place]) 03/04/2012  . Osteopenia 03/04/2012   Kerin Perna, PTA 07/15/18 1:21 PM  [place], [place], [address] Phone: [phone]   Fax:  [phone]  Name: EDNAH HAMMOCK MRN: [id number] Date of Birth: Apr 15, 1956

## 2018-07-19 ENCOUNTER — Encounter: Payer: Self-pay | Admitting: Physical Therapy

## 2018-07-19 ENCOUNTER — Ambulatory Visit (INDEPENDENT_AMBULATORY_CARE_PROVIDER_SITE_OTHER): Payer: PRIVATE HEALTH INSURANCE | Admitting: Physical Therapy

## 2018-07-19 DIAGNOSIS — M25662 Stiffness of left knee, not elsewhere classified: Secondary | ICD-10-CM | POA: Diagnosis not present

## 2018-07-19 DIAGNOSIS — M25562 Pain in left knee: Secondary | ICD-10-CM

## 2018-07-19 DIAGNOSIS — R601 Generalized edema: Secondary | ICD-10-CM | POA: Diagnosis not present
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
## 2018-07-22 ENCOUNTER — Other Ambulatory Visit: Payer: Self-pay | Admitting: Family Medicine

## 2018-07-22 ENCOUNTER — Encounter: Payer: Self-pay | Admitting: Family Medicine

## 2018-07-22 ENCOUNTER — Ambulatory Visit (INDEPENDENT_AMBULATORY_CARE_PROVIDER_SITE_OTHER): Payer: PRIVATE HEALTH INSURANCE | Admitting: Physical Therapy

## 2018-07-22 DIAGNOSIS — M25662 Stiffness of left knee, not elsewhere classified: Secondary | ICD-10-CM

## 2018-07-22 DIAGNOSIS — M25562 Pain in left knee: Secondary | ICD-10-CM

## 2018-07-22 DIAGNOSIS — F900 Attention-deficit hyperactivity disorder, predominantly inattentive type: Secondary | ICD-10-CM

## 2018-07-22 DIAGNOSIS — R601 Generalized edema: Secondary | ICD-10-CM

## 2018-07-22 NOTE — Telephone Encounter (Signed)
Please advise if pt vyvanse can be fill at [facility] . Thanks [facility]

## 2018-07-22 NOTE — Therapy (Signed)
[place], [place], [address] Phone: [phone]   Fax:  [phone]  Physical Therapy Treatment  Patient Details  Name: BARABARA MOTZ MRN: [id number] Date of Birth: [birth year] Referring Provider: Dr. Frederik Pear   Encounter Date: 07/22/2018  PT End of Session - 07/22/18 1343    Visit Number  7    Number of Visits  24    Date for PT Re-Evaluation  09/23/18    PT Start Time  1115 pt 15 min late    PT Stop Time  1200    PT Time Calculation (min)  45 min    Activity Tolerance  Patient tolerated treatment well    Behavior During Therapy  [facility] for tasks assessed/performed       Past Medical History:  Diagnosis Date  . ADD (attention deficit disorder)   . Allergy    rhinitis  . Anemia   . Arthritis    osteo  . Breast cancer (HCC)    Breast , skin - basal   . Complication of anesthesia    gastric bypass [facility] ,  acetyl succ-, hard time awaking, placed back on vent for a couiple hour  . Dyspnea   . GERD (gastroesophageal reflux disease)   . History of hiatal hernia   . History of kidney stones    passed  . Hypothyroid   . Internal hemorrhoids   . Obesity   . Pneumonia   . Tubular adenoma of colon     Past Surgical History:  Procedure Laterality Date  . BREAST LUMPECTOMY Right   . CESAREAN SECTION     x 2  . CHOLECYSTECTOMY    . COLONOSCOPY    . ESOPHAGOGASTRODUODENOSCOPY    . GASTRIC BYPASS    . JOINT REPLACEMENT    . KNEE ARTHROSCOPY Right    x 3  . LAPAROSCOPIC APPENDECTOMY N/A 04/03/2015   Procedure: APPENDECTOMY LAPAROSCOPIC;  Surgeon: Autumn Messing III, MD;  [facility];  Service: General;  Laterality: N/A;  . LITHOTRIPSY     x3  . MASTECTOMY Bilateral    with lymph node dissection  bil reconstruction 2009  . TOTAL KNEE ARTHROPLASTY Right 12/18/2013   Procedure: TOTAL KNEE ARTHROPLASTY;  Surgeon: Kerin Salen, MD;  Location: [place];  Service: Orthopedics;   Laterality: Right;  . TOTAL KNEE ARTHROPLASTY Left 06/29/2018  . TOTAL KNEE ARTHROPLASTY Left 06/29/2018   Procedure: LEFT TOTAL KNEE ARTHROPLASTY;  Surgeon: Frederik Pear, MD;  Location: [place];  Service: Orthopedics;  Laterality: Left;  Marland Kitchen VAGINAL HYSTERECTOMY      There were no vitals filed for this visit.  Subjective Assessment - 07/22/18 1120    Subjective  Pt relays less pain but continued stiffness    Currently in Pain?  Yes    Pain Score  2     Pain Location  Knee    Pain Orientation  Left    Pain Descriptors / Indicators  Tightness    Pain Type  Surgical pain                       [facility] Adult PT Treatment/Exercise - 07/22/18 1122      Knee/Hip Exercises: Stretches   Passive Hamstring Stretch  Left;30 seconds;2 reps    Quad Stretch  Left;30 seconds;2 reps    ITB Stretch  --    Gastroc Stretch  Left;30 seconds;2 reps    Other Knee/Hip  Stretches  Lunge stretch at step 10 sec X 10     Knee/Hip Exercises: Aerobic   Recumbent Bike  6 min for ROM, L2 resistance    Nustep  --      Knee/Hip Exercises: Standing   Lateral Step Up  Left;1 set;Step Height: 6";Hand Hold: 1;20 reps    Forward Step Up  Left;1 set;Hand Hold: 1;Step Height: 6";20 reps    Step Down  -- 3" step, BUE support to no support    SLS  Lt 30 sec X 2      Knee/Hip Exercises: Seated   Sit to Sand  2 sets;5 reps;without UE support feet even      Knee/Hip Exercises: Supine   Heel Slides  2 sets;AAROM;10 reps      Shoulder Exercises: Stretch   Other Shoulder Stretches  --      Modalities   Modalities  --      Cryotherapy   Cryotherapy Location  -- Lt anterior      Acupuncturist Location  --    Electrical Stimulation Goals  --      Vasopneumatic   Number Minutes Vasopneumatic   15 minutes    Vasopnuematic Location   Knee    Vasopneumatic Pressure  Medium    Vasopneumatic Temperature   34 deg       Manual Therapy   Manual Therapy  Soft tissue  mobilization    Soft tissue mobilization  IASTM and STM to Lt quad to decrease pain, fascial restriction and improve ROM                PT Short Term Goals - 07/12/18 1011      PT SHORT TERM GOAL #1   Title  Patient independent in initial HEP 08/12/18    Time  6    Period  Weeks    Status  Achieved      PT SHORT TERM GOAL #2   Title  Increase AROM Rt knee to (-) 2 deg to 105 deg 08/13/18    Time  6    Period  Weeks    Status  Partially Met      PT SHORT TERM GOAL #3   Title  Progress to single point cane for ambulation with good gait pattern 08/13/18    Time  6    Period  Weeks    Status  Partially Met        PT Long Term Goals - 07/19/18 1202      PT LONG TERM GOAL #1   Title  5-/5 to 5/5 strength  LE 09/23/18    Time  12    Period  Weeks    Status  Partially Met      PT LONG TERM GOAL #2   Title  AROM Lt knee 0 to 115 degrees 09/23/18    Time  12    Period  Weeks    Status  On-going      PT LONG TERM GOAL #3   Title  independent gait without assistive device for community distances 09/23/18    Time  12    Period  Weeks    Status  Partially Met      PT LONG TERM GOAL #4   Title  Independent in HEP 09/23/18    Time  12    Period  Weeks    Status  On-going      PT LONG TERM GOAL #  5   Title  Improve FOTO to </= 60% limitation 09/23/18    Time  12    Period  Weeks    Status  On-going      PT LONG TERM GOAL #6   Title  Improve posture and alignment with patient to demonstrate impeoved upright posture with posterior shoulder girdle engaged 09/23/18    Time  12    Period  Weeks    Status  On-going      PT LONG TERM GOAL #7   Title  Independent in HEP for stretching/strengthening to improve thoracic alignment and muscular balance therefore decreasing thoracic pain and discomfort 09/23/18    Time  12    Period  Weeks    Status  On-going            Plan - 07/22/18 1344    Clinical Impression Statement  Pt continues to make good progress with ROM  and strength and has improved knee flexion ROM to 112 deg. She had no complaints with therex today however some was held due to time constraints as she was 15 min late. Vasoconstriction performed at the end for inflammaiton and edema.    Rehab Potential  Good    PT Frequency  2x / week    PT Duration  12 weeks    PT Treatment/Interventions  Patient/family education;ADLs/Self Care Home Management;Cryotherapy;Electrical Stimulation;Iontophoresis 6m/ml Dexamethasone;Moist Heat;Ultrasound;Gait training;Stair training;Functional mobility training;Therapeutic activities;Therapeutic exercise;Balance training;Neuromuscular re-education;Scar mobilization;Manual techniques;Dry needling    PT Next Visit Plan  continue progressive Lt knee ROM, strengthening and gait; postural correction; stretching pecs; strengthening posterior shoulder girdle musculature      Consulted and Agree with Plan of Care  Patient       Patient will benefit from skilled therapeutic intervention in order to improve the following deficits and impairments:  Abnormal gait, Decreased balance, Decreased mobility, Decreased range of motion, Decreased strength, Increased edema, Pain, Postural dysfunction, Improper body mechanics  Visit Diagnosis: Acute pain of left knee  Stiffness of left knee, not elsewhere classified  Generalized edema     Problem List Patient Active Problem List   Diagnosis Date Noted  . Primary osteoarthritis of left knee 06/29/2018  . Degenerative arthritis of left knee 06/07/2018  . S/P TKR (total knee replacement) using cement 03/12/2016  . Hypothyroidism 03/12/2016  . Solar lentigo 03/12/2016  . Attention deficit hyperactivity disorder (ADHD), predominantly inattentive type 07/04/2013  . Iron deficiency anemia 04/05/2013  . H/O gastric bypass 03/06/2012  . Malignant neoplasm of upper-outer quadrant of right breast in female, estrogen receptor positive [place] 03/04/2012  . Osteopenia 03/04/2012     BDebbe Odea PT, DPT 07/22/2018, 1:47 PM  [facility] [address]Phone: [phone]  Fax:  [phone] Name: NGREIDY SHERARDMRN: [id number] of Birth: 5Dec 02, 1957

## 2018-07-25 MED ORDER — TRIAMCINOLONE ACETONIDE 0.5 % EX CREA: 1.0000 "application " | TOPICAL_CREAM | Freq: Three times a day (TID) | CUTANEOUS | 5 refills | Status: DC

## 2018-08-02 ENCOUNTER — Ambulatory Visit (INDEPENDENT_AMBULATORY_CARE_PROVIDER_SITE_OTHER): Payer: PRIVATE HEALTH INSURANCE | Admitting: Physical Therapy

## 2018-08-02 DIAGNOSIS — M25662 Stiffness of left knee, not elsewhere classified: Secondary | ICD-10-CM | POA: Diagnosis not present

## 2018-08-02 DIAGNOSIS — M25562 Pain in left knee: Secondary | ICD-10-CM

## 2018-08-02 DIAGNOSIS — R601 Generalized edema: Secondary | ICD-10-CM

## 2018-08-02 NOTE — Therapy (Signed)
[place], [place], [address] Phone: [phone]   Fax:  [phone]  Physical Therapy Treatment  Patient Details  Name: Christy [name] MRN: [id number] Date of Birth: 1956/11/10 Referring Provider: Mayer Camel   Encounter Date: 08/02/2018  PT End of Session - 08/02/18 1115    Visit Number  8    Number of Visits  24    Date for PT Re-Evaluation  09/23/18    PT Start Time  1100    PT Stop Time  1155    PT Time Calculation (min)  55 min    Activity Tolerance  Patient tolerated treatment well    Behavior During Therapy  [facility] for tasks assessed/performed       Past Medical History:  Diagnosis Date  . ADD (attention deficit disorder)   . Allergy    rhinitis  . Anemia   . Arthritis    osteo  . Breast cancer (HCC)    Breast , skin - basal   . Complication of anesthesia    gastric bypass [facility] ,  acetyl succ-, hard time awaking, placed back on vent for a couiple hour  . Dyspnea   . GERD (gastroesophageal reflux disease)   . History of hiatal hernia   . History of kidney stones    passed  . Hypothyroid   . Internal hemorrhoids   . Obesity   . Pneumonia   . Tubular adenoma of colon     Past Surgical History:  Procedure Laterality Date  . BREAST LUMPECTOMY Right   . CESAREAN SECTION     x 2  . CHOLECYSTECTOMY    . COLONOSCOPY    . ESOPHAGOGASTRODUODENOSCOPY    . GASTRIC BYPASS    . JOINT REPLACEMENT    . KNEE ARTHROSCOPY Right    x 3  . LAPAROSCOPIC APPENDECTOMY N/A 04/03/2015   Procedure: APPENDECTOMY LAPAROSCOPIC;  Surgeon: Autumn Messing III, MD;  [facility];  Service: General;  Laterality: N/A;  . LITHOTRIPSY     x3  . MASTECTOMY Bilateral    with lymph node dissection  bil reconstruction 2009  . TOTAL KNEE ARTHROPLASTY Right 12/18/2013   Procedure: TOTAL KNEE ARTHROPLASTY;  Surgeon: Kerin Salen, MD;  Location: [place];  Service: Orthopedics;  Laterality: Right;  .  TOTAL KNEE ARTHROPLASTY Left 06/29/2018  . TOTAL KNEE ARTHROPLASTY Left 06/29/2018   Procedure: LEFT TOTAL KNEE ARTHROPLASTY;  Surgeon: Frederik Pear, MD;  Location: [place];  Service: Orthopedics;  Laterality: Left;  Marland Kitchen VAGINAL HYSTERECTOMY      There were no vitals filed for this visit.      [facility] PT Assessment - 08/02/18 0001      Assessment   Medical Diagnosis  Lt TKA    Referring Provider  Mayer Camel    Onset Date/Surgical Date  06/29/18    Next MD Visit  08/10/18      AROM   Left Knee Extension  0    Left Knee Flexion  122 AAROM      Strength   Right Knee Flexion  5/5    Right Knee Extension  5/5                   [facility] Adult PT Treatment/Exercise - 08/02/18 1111      Knee/Hip Exercises: Stretches   Passive Hamstring Stretch  Left;30 seconds;2 reps    Quad Stretch  Left;30 seconds;2 reps      Knee/Hip  Exercises: Aerobic   Recumbent Bike  6 min for ROM, L2 resistance      Knee/Hip Exercises: Standing   Lateral Step Up  Left;1 set;Step Height: 6";Hand Hold: 1;20 reps    Forward Step Up  Left;1 set;Hand Hold: 1;Step Height: 6";20 reps    Step Down  --    SLS  Lt 30 sec X 3 on foam    Other Standing Knee Exercises  sidestepping with mini squat [place] up/down X1      Knee/Hip Exercises: Seated   Sit to Sand  5 reps;without UE support;3 sets feet even      Knee/Hip Exercises: Supine   Heel Slides  2 sets;AAROM;10 reps      Cryotherapy   Cryotherapy Location  -- Lt anterior      Vasopneumatic   Number Minutes Vasopneumatic   15 minutes    Vasopnuematic Location   Knee    Vasopneumatic Pressure  Medium    Vasopneumatic Temperature   34 deg       Manual Therapy   Manual Therapy  --    Soft tissue mobilization  --               PT Short Term Goals - 07/12/18 1011      PT SHORT TERM GOAL #1   Title  Patient independent in initial HEP 08/12/18    Time  6    Period  Weeks    Status  Achieved      PT SHORT TERM GOAL #2   Title  Increase AROM Rt  knee to (-) 2 deg to 105 deg 08/13/18    Time  6    Period  Weeks    Status  Partially Met      PT SHORT TERM GOAL #3   Title  Progress to single point cane for ambulation with good gait pattern 08/13/18    Time  6    Period  Weeks    Status  Partially Met        PT Long Term Goals - 08/02/18 1148      PT LONG TERM GOAL #1   Title  5-/5 to 5/5 strength  LE 09/23/18    Time  12    Period  Weeks    Status  Achieved      PT LONG TERM GOAL #2   Title  AROM Lt knee 0 to 115 degrees 09/23/18    Period  Weeks    Status  Achieved      PT LONG TERM GOAL #3   Title  independent gait without assistive device for community distances 09/23/18    Status  Achieved      PT LONG TERM GOAL #4   Title  Independent in HEP 09/23/18    Status  On-going      PT LONG TERM GOAL #5   Title  Improve FOTO to </= 60% limitation 09/23/18    Status  On-going      PT LONG TERM GOAL #6   Title  Improve posture and alignment with patient to demonstrate impeoved upright posture with posterior shoulder girdle engaged 09/23/18    Status  On-going      PT LONG TERM GOAL #7   Title  Independent in HEP for stretching/strengthening to improve thoracic alignment and muscular balance therefore decreasing thoracic pain and discomfort 09/23/18    Status  On-going            Plan -  08/02/18 1146    Clinical Impression Statement  Pt now has 0-122 deg knee ROM and has met her ROM goal as well as met her strength goal of 5/5 MMT. She has made excellent progress but does still have decreased muscular endurance requiring rest breaks. She will continue to benefit from PT to address this.    Rehab Potential  Good    PT Frequency  2x / week    PT Duration  12 weeks    PT Treatment/Interventions  Patient/family education;ADLs/Self Care Home Management;Cryotherapy;Electrical Stimulation;Iontophoresis 35m/ml Dexamethasone;Moist Heat;Ultrasound;Gait training;Stair training;Functional mobility training;Therapeutic  activities;Therapeutic exercise;Balance training;Neuromuscular re-education;Scar mobilization;Manual techniques;Dry needling    PT Next Visit Plan  strength and endurance    Consulted and Agree with Plan of Care  Patient       Patient will benefit from skilled therapeutic intervention in order to improve the following deficits and impairments:     Visit Diagnosis: Acute pain of left knee  Stiffness of left knee, not elsewhere classified  Generalized edema     Problem List Patient Active Problem List   Diagnosis Date Noted  . Primary osteoarthritis of left knee 06/29/2018  . Degenerative arthritis of left knee 06/07/2018  . S/P TKR (total knee replacement) using cement 03/12/2016  . Hypothyroidism 03/12/2016  . Solar lentigo 03/12/2016  . Attention deficit hyperactivity disorder (ADHD), predominantly inattentive type 07/04/2013  . Iron deficiency anemia 04/05/2013  . H/O gastric bypass 03/06/2012  . Malignant neoplasm of upper-outer quadrant of right breast in female, estrogen receptor positive ([place] 03/04/2012  . Osteopenia 03/04/2012    BDebbe Odea PT, DPT 08/02/2018, 11:50 AM  [facility] [address]Phone: [phone]  Fax:  [phone] Name: NLENEE FRANZEMRN: [id number] of Birth: [birth year]

## 2018-08-05 ENCOUNTER — Ambulatory Visit (INDEPENDENT_AMBULATORY_CARE_PROVIDER_SITE_OTHER): Payer: PRIVATE HEALTH INSURANCE | Admitting: Physical Therapy

## 2018-08-05 ENCOUNTER — Encounter: Payer: Self-pay | Admitting: Family Medicine

## 2018-08-05 ENCOUNTER — Ambulatory Visit: Payer: PRIVATE HEALTH INSURANCE | Admitting: Family Medicine

## 2018-08-05 VITALS — BP 140/88 | HR 114 | Temp 98.1°F | Wt 190.2 lb

## 2018-08-05 DIAGNOSIS — F900 Attention-deficit hyperactivity disorder, predominantly inattentive type: Secondary | ICD-10-CM | POA: Diagnosis not present

## 2018-08-05 DIAGNOSIS — M25562 Pain in left knee: Secondary | ICD-10-CM | POA: Diagnosis not present

## 2018-08-05 DIAGNOSIS — L309 Dermatitis, unspecified: Secondary | ICD-10-CM | POA: Diagnosis not present

## 2018-08-05 DIAGNOSIS — M6281 Muscle weakness (generalized): Secondary | ICD-10-CM | POA: Diagnosis not present

## 2018-08-05 DIAGNOSIS — M25662 Stiffness of left knee, not elsewhere classified: Secondary | ICD-10-CM | POA: Diagnosis not present

## 2018-08-05 MED ORDER — CLOBETASOL PROPIONATE 0.05 % EX CREA: 1.0000 "application " | TOPICAL_CREAM | Freq: Two times a day (BID) | CUTANEOUS | 2 refills | Status: DC

## 2018-08-05 NOTE — Progress Notes (Signed)
   Subjective:    Patient ID: Christy [name], female    DOB: 1956-11-24, 62 y.o.   MRN: [id number]  HPI She is here for a rash that is on her thigh and on her forearms.  She has been using Aristocort with minimal relief of symptoms.  She also has underlying ADHD.  She does use Vyvanse and gets roughly 8 hours of benefit on that.  She also occasionally will take an Adderall and she needs shorter acting stimulant.   Review of Systems     Objective:   Physical Exam Alert and in no distress.  Very fine erythematous slightly dry lesions are noted on the left medial thigh as well as on her forearms.       Assessment & Plan:  Eczema, unspecified type - Plan: clobetasol cream (TEMOVATE) 0.05 %  Attention deficit hyperactivity disorder (ADHD), predominantly inattentive type I will increase her concentration of steroid.  If this does not work, she will see a Paediatric nurse.  Renew her ADD meds for the next year as needed.

## 2018-08-05 NOTE — Therapy (Signed)
[place], [place], [address] Phone: [phone]   Fax:  [phone]  Physical Therapy Treatment  Patient Details  Name: Christy [name] MRN: [id number] Date of Birth: [birth year] Referring Provider: Mayer Camel   Encounter Date: 08/05/2018  PT End of Session - 08/05/18 1202    Visit Number  9    Number of Visits  24    Date for PT Re-Evaluation  09/23/18    PT Start Time  1103    PT Stop Time  1157   last 10 min on ice   PT Time Calculation (min)  54 min    Activity Tolerance  Patient tolerated treatment well    Behavior During Therapy  [facility] for tasks assessed/performed       Past Medical History:  Diagnosis Date  . ADD (attention deficit disorder)   . Allergy    rhinitis  . Anemia   . Arthritis    osteo  . Breast cancer (HCC)    Breast , skin - basal   . Complication of anesthesia    gastric bypass [facility] ,  acetyl succ-, hard time awaking, placed back on vent for a couiple hour  . Dyspnea   . GERD (gastroesophageal reflux disease)   . History of hiatal hernia   . History of kidney stones    passed  . Hypothyroid   . Internal hemorrhoids   . Obesity   . Pneumonia   . Tubular adenoma of colon     Past Surgical History:  Procedure Laterality Date  . BREAST LUMPECTOMY Right   . CESAREAN SECTION     x 2  . CHOLECYSTECTOMY    . COLONOSCOPY    . ESOPHAGOGASTRODUODENOSCOPY    . GASTRIC BYPASS    . JOINT REPLACEMENT    . KNEE ARTHROSCOPY Right    x 3  . LAPAROSCOPIC APPENDECTOMY N/A 04/03/2015   Procedure: APPENDECTOMY LAPAROSCOPIC;  Surgeon: Autumn Messing III, MD;  Location: [facility];  Service: General;  Laterality: N/A;  . LITHOTRIPSY     x3  . MASTECTOMY Bilateral    with lymph node dissection  bil reconstruction 2009  . TOTAL KNEE ARTHROPLASTY Right 12/18/2013   Procedure: TOTAL KNEE ARTHROPLASTY;  Surgeon: Kerin Salen, MD;  Location: [place];  Service: Orthopedics;   Laterality: Right;  . TOTAL KNEE ARTHROPLASTY Left 06/29/2018  . TOTAL KNEE ARTHROPLASTY Left 06/29/2018   Procedure: LEFT TOTAL KNEE ARTHROPLASTY;  Surgeon: Frederik Pear, MD;  Location: [place];  Service: Orthopedics;  Laterality: Left;  Marland Kitchen VAGINAL HYSTERECTOMY      There were no vitals filed for this visit.  Subjective Assessment - 08/05/18 1159    Subjective  Pt relays stiffness and some soreness but no real pain    Currently in Pain?  No/denies                       [facility] Adult PT Treatment/Exercise - 08/05/18 1200      Knee/Hip Exercises: Stretches   Passive Hamstring Stretch  Left;30 seconds;2 reps    Quad Stretch  Left;30 seconds;2 reps      Knee/Hip Exercises: Aerobic   Recumbent Bike  6 min for ROM, L2 resistance      Knee/Hip Exercises: Standing   Lateral Step Up  Left;1 set;Step Height: 6";20 reps;Hand Hold: 0    Forward Step Up  Left;1 set;Step Height: 6";20 reps;Hand Hold: 0  SLS  Lt 30 sec X 3 on foam    Other Standing Knee Exercises  sidestepping with mini squat Green up/down X1      Knee/Hip Exercises: Seated   Sit to Sand  5 reps;without UE support;3 sets   feet even     Knee/Hip Exercises: Supine   Heel Slides  2 sets;AAROM;10 reps      Cryotherapy   Number Minutes Cryotherapy  10 Minutes    Cryotherapy Location  Knee   Lt anterior     Manual Therapy   Manual therapy comments  KT tape for bruising             PT Education - 08/05/18 1201    Education Details  KT tape indication and insrucitons    Person(s) Educated  Patient    Methods  Explanation    Comprehension  Verbalized understanding       PT Short Term Goals - 07/12/18 1011      PT SHORT TERM GOAL #1   Title  Patient independent in initial HEP 08/12/18    Time  6    Period  Weeks    Status  Achieved      PT SHORT TERM GOAL #2   Title  Increase AROM Rt knee to (-) 2 deg to 105 deg 08/13/18    Time  6    Period  Weeks    Status  Partially Met      PT SHORT  TERM GOAL #3   Title  Progress to single point cane for ambulation with good gait pattern 08/13/18    Time  6    Period  Weeks    Status  Partially Met        PT Long Term Goals - 08/02/18 1148      PT LONG TERM GOAL #1   Title  5-/5 to 5/5 strength  LE 09/23/18    Time  12    Period  Weeks    Status  Achieved      PT LONG TERM GOAL #2   Title  AROM Lt knee 0 to 115 degrees 09/23/18    Period  Weeks    Status  Achieved      PT LONG TERM GOAL #3   Title  independent gait without assistive device for community distances 09/23/18    Status  Achieved      PT LONG TERM GOAL #4   Title  Independent in HEP 09/23/18    Status  On-going      PT LONG TERM GOAL #5   Title  Improve FOTO to </= 60% limitation 09/23/18    Status  On-going      PT LONG TERM GOAL #6   Title  Improve posture and alignment with patient to demonstrate impeoved upright posture with posterior shoulder girdle engaged 09/23/18    Status  On-going      PT LONG TERM GOAL #7   Title  Independent in HEP for stretching/strengthening to improve thoracic alignment and muscular balance therefore decreasing thoracic pain and discomfort 09/23/18    Status  On-going            Plan - 08/05/18 1204    Clinical Impression Statement  Pt continues to progress in all areas but is still limited with stiffness and muscular endurance. She continues to show good effort and improvements in PT. She was able to perform step ups today without any UE assistance    Rehab  Potential  Good    PT Frequency  2x / week    PT Duration  12 weeks    PT Treatment/Interventions  Patient/family education;ADLs/Self Care Home Management;Cryotherapy;Electrical Stimulation;Iontophoresis 23m/ml Dexamethasone;Moist Heat;Ultrasound;Gait training;Stair training;Functional mobility training;Therapeutic activities;Therapeutic exercise;Balance training;Neuromuscular re-education;Scar mobilization;Manual techniques;Dry needling    PT Next Visit Plan   strength and endurance    Consulted and Agree with Plan of Care  Patient       Patient will benefit from skilled therapeutic intervention in order to improve the following deficits and impairments:  Abnormal gait, Decreased balance, Decreased mobility, Decreased range of motion, Decreased strength, Increased edema, Pain, Postural dysfunction, Improper body mechanics  Visit Diagnosis: Acute pain of left knee  Stiffness of left knee, not elsewhere classified  Muscle weakness (generalized)     Problem List Patient Active Problem List   Diagnosis Date Noted  . Primary osteoarthritis of left knee 06/29/2018  . Degenerative arthritis of left knee 06/07/2018  . S/P TKR (total knee replacement) using cement 03/12/2016  . Hypothyroidism 03/12/2016  . Solar lentigo 03/12/2016  . Attention deficit hyperactivity disorder (ADHD), predominantly inattentive type 07/04/2013  . Iron deficiency anemia 04/05/2013  . H/O gastric bypass 03/06/2012  . Malignant neoplasm of upper-outer quadrant of right breast in female, estrogen receptor positive ([facility] 03/04/2012  . Osteopenia 03/04/2012    BDebbe Odea PT, DPT 08/05/2018, 12:08 PM  [facility] [address]Phone: [phone]  Fax:  [phone] Name: Christy REINERTSENMRN: [id number] of Birth: [birth year]

## 2018-08-09 ENCOUNTER — Ambulatory Visit (INDEPENDENT_AMBULATORY_CARE_PROVIDER_SITE_OTHER): Payer: PRIVATE HEALTH INSURANCE | Admitting: Physical Therapy

## 2018-08-09 DIAGNOSIS — M25562 Pain in left knee: Secondary | ICD-10-CM | POA: Diagnosis not present

## 2018-08-09 DIAGNOSIS — M25662 Stiffness of left knee, not elsewhere classified: Secondary | ICD-10-CM | POA: Diagnosis not present

## 2018-08-09 DIAGNOSIS — M6281 Muscle weakness (generalized): Secondary | ICD-10-CM | POA: Diagnosis not present

## 2018-08-09 DIAGNOSIS — R601 Generalized edema: Secondary | ICD-10-CM

## 2018-08-09 DIAGNOSIS — R29898 Other symptoms and signs involving the musculoskeletal system: Secondary | ICD-10-CM

## 2018-08-09 DIAGNOSIS — R2689 Other abnormalities of gait and mobility: Secondary | ICD-10-CM

## 2018-08-09 NOTE — Patient Instructions (Signed)
Resisted External Rotation: in Neutral - Bilateral  PALMS UP!!! Sit or stand, tubing in both hands, elbows at sides, bent to 90, forearms forward. Pinch shoulder blades together and rotate forearms out. Keep elbows at sides. Repeat __10__ times per set. Do __2-3__ sets per session. Do __3-4__ sessions per week.  Resistive Band Rowing   With resistive band anchored in door, grasp both ends. Keeping elbows bent, pull back, squeezing shoulder blades together. Hold _3-5___ seconds. Repeat _10___ times. Do __1-2__ sessions per day.   Strengthening: Resisted Extension   Hold tubing with both hands, arms forward. Pull arms back, elbow straight. Repeat _10___ times per set. Do __1-2__ sets per session. Do _1___ sessions per day.

## 2018-08-09 NOTE — Therapy (Signed)
[place], [place], [address] Phone: [phone]   Fax:  [phone]  Physical Therapy Treatment  Patient Details  Name: Christy [name] MRN: [id number] Date of Birth: Sep 05, 1956 Referring Provider: Dr. Mayer Camel   Encounter Date: 08/09/2018  PT End of Session - 08/09/18 1111    Visit Number  10    Number of Visits  24    Date for PT Re-Evaluation  09/23/18    PT Start Time  1105    PT Stop Time  1145    PT Time Calculation (min)  40 min    Activity Tolerance  Patient tolerated treatment well    Behavior During Therapy  [facility] for tasks assessed/performed       Past Medical History:  Diagnosis Date  . ADD (attention deficit disorder)   . Allergy    rhinitis  . Anemia   . Arthritis    osteo  . Breast cancer (HCC)    Breast , skin - basal   . Complication of anesthesia    gastric bypass [facility] ,  acetyl succ-, hard time awaking, placed back on vent for a couiple hour  . Dyspnea   . GERD (gastroesophageal reflux disease)   . History of hiatal hernia   . History of kidney stones    passed  . Hypothyroid   . Internal hemorrhoids   . Obesity   . Pneumonia   . Tubular adenoma of colon     Past Surgical History:  Procedure Laterality Date  . BREAST LUMPECTOMY Right   . CESAREAN SECTION     x 2  . CHOLECYSTECTOMY    . COLONOSCOPY    . ESOPHAGOGASTRODUODENOSCOPY    . GASTRIC BYPASS    . JOINT REPLACEMENT    . KNEE ARTHROSCOPY Right    x 3  . LAPAROSCOPIC APPENDECTOMY N/A 04/03/2015   Procedure: APPENDECTOMY LAPAROSCOPIC;  Surgeon: Autumn Messing III, MD;  Location: [facility];  Service: General;  Laterality: N/A;  . LITHOTRIPSY     x3  . MASTECTOMY Bilateral    with lymph node dissection  bil reconstruction 2009  . TOTAL KNEE ARTHROPLASTY Right 12/18/2013   Procedure: TOTAL KNEE ARTHROPLASTY;  Surgeon: Kerin Salen, MD;  Location: [place];  Service: Orthopedics;  Laterality: Right;   . TOTAL KNEE ARTHROPLASTY Left 06/29/2018  . TOTAL KNEE ARTHROPLASTY Left 06/29/2018   Procedure: LEFT TOTAL KNEE ARTHROPLASTY;  Surgeon: Frederik Pear, MD;  Location: [place];  Service: Orthopedics;  Laterality: Left;  Marland Kitchen VAGINAL HYSTERECTOMY      There were no vitals filed for this visit.  Subjective Assessment - 08/09/18 1111    Subjective  Pt reports no new changes since last visit.  She returns to work in 2 days.  She reports she knows she needs to build up strength and stamina to tolerate work better.     Pertinent History  Rt knee injury 1974 - several surgerires on Rt knee creating stress on the Lt; Rt TKA 2014; denies any other medical problems; osteopenia; bilat mastectomy 2009     Patient Stated Goals  get the Lt knee working like the Rt     Currently in Pain?  No/denies    Pain Score  0-No pain         [facility] PT Assessment - 08/09/18 0001      Assessment   Medical Diagnosis  Lt TKA    Referring Provider  Dr. Mayer Camel  Onset Date/Surgical Date  06/29/18      Observation/Other Assessments   Focus on Therapeutic Outcomes (FOTO)   31% limited       AROM   Left Knee Extension  0    Left Knee Flexion  127      Flexibility   Hamstrings  Lt 115 deg        [facility] Adult PT Treatment/Exercise - 08/09/18 0001      Knee/Hip Exercises: Stretches   Passive Hamstring Stretch  Left;2 reps;60 seconds    Quad Stretch  Left;2 reps;30 seconds    Gastroc Stretch  Right;Left;3 reps;30 seconds      Knee/Hip Exercises: Aerobic   Recumbent Bike  6 min for ROM, L2 resistance      Knee/Hip Exercises: Standing   Other Standing Knee Exercises  squats to airx pad on low mat x 4 reps, 2 sets      Shoulder Exercises: Standing   External Rotation  Both;12 reps;Theraband    Theraband Level (Shoulder External Rotation)  Level 1 (Yellow)    Extension  Both;15 reps;Theraband    Theraband Level (Shoulder Extension)  Level 2 (Red)    Row  Both;20 reps;Theraband    Theraband Level (Shoulder Row)   Level 3 (Green)    Other Standing Exercises  sash with yellow band x 5 reps each side, cues for form.       Shoulder Exercises: Stretch   Other Shoulder Stretches  3 position door stretch 30 sec x 3 reps each position      Modalities   Modalities  --   pt declined             PT Education - 08/09/18 1145    Education Details  HEP, issued red band.     Person(s) Educated  Patient    Methods  Explanation;Handout;Verbal cues;Tactile cues;Demonstration    Comprehension  Returned demonstration;Verbalized understanding       PT Short Term Goals - 08/09/18 1317      PT SHORT TERM GOAL #1   Title  Patient independent in initial HEP 08/12/18    Time  6    Period  Weeks    Status  Achieved      PT SHORT TERM GOAL #2   Title  Increase AROM Rt knee to (-) 2 deg to 105 deg 08/13/18    Time  6    Period  Weeks    Status  Achieved      PT SHORT TERM GOAL #3   Title  Progress to single point cane for ambulation with good gait pattern 08/13/18    Time  6    Period  Weeks    Status  Achieved        PT Long Term Goals - 08/09/18 1118      PT LONG TERM GOAL #1   Title  5-/5 to 5/5 strength  LE 09/23/18    Time  12    Period  Weeks    Status  Achieved      PT LONG TERM GOAL #2   Title  AROM Lt knee 0 to 115 degrees 09/23/18    Time  12    Period  Weeks    Status  Achieved      PT LONG TERM GOAL #3   Title  independent gait without assistive device for community distances 09/23/18    Time  12    Period  Weeks    Status  Achieved      PT LONG TERM GOAL #4   Title  Independent in HEP 09/23/18    Time  12    Period  Weeks    Status  On-going      PT LONG TERM GOAL #5   Title  Improve FOTO to </= 60% limitation 09/23/18    Time  12    Period  Weeks    Status  On-going      PT LONG TERM GOAL #6   Title  Improve posture and alignment with patient to demonstrate improved upright posture with posterior shoulder girdle engaged 09/23/18    Time  12    Period  Weeks     Status  On-going      PT LONG TERM GOAL #7   Title  Independent in HEP for stretching/strengthening to improve thoracic alignment and muscular balance therefore decreasing thoracic pain and discomfort 09/23/18    Time  12    Period  Weeks    Status  On-going            Plan - 08/09/18 1313    Clinical Impression Statement  Pt's Lt knee ROM is 0-127 deg.  She has made great gains towards goals for her knee.  Focus today was on postural strengthening to address 2nd diagnosis. Pt has partially met her goals. Pt will benefit from continued PT intervention to work on endurance and balance to assist with her return to work.     Rehab Potential  Good    PT Frequency  2x / week    PT Duration  12 weeks    PT Treatment/Interventions  Patient/family education;ADLs/Self Care Home Management;Cryotherapy;Electrical Stimulation;Iontophoresis 101m/ml Dexamethasone;Moist Heat;Ultrasound;Gait training;Stair training;Functional mobility training;Therapeutic activities;Therapeutic exercise;Balance training;Neuromuscular re-education;Scar mobilization;Manual techniques;Dry needling    PT Next Visit Plan  balance, postural strengthening (prone on pball). discuss walking program for endurance.     Consulted and Agree with Plan of Care  Patient       Patient will benefit from skilled therapeutic intervention in order to improve the following deficits and impairments:  Abnormal gait, Decreased balance, Decreased mobility, Decreased range of motion, Decreased strength, Increased edema, Pain, Postural dysfunction, Improper body mechanics  Visit Diagnosis: Acute pain of left knee  Stiffness of left knee, not elsewhere classified  Muscle weakness (generalized)  Generalized edema  Other abnormalities of gait and mobility  Other symptoms and signs involving the musculoskeletal system     Problem List Patient Active Problem List   Diagnosis Date Noted  . Primary osteoarthritis of left knee 06/29/2018   . Degenerative arthritis of left knee 06/07/2018  . S/P TKR (total knee replacement) using cement 03/12/2016  . Hypothyroidism 03/12/2016  . Solar lentigo 03/12/2016  . Attention deficit hyperactivity disorder (ADHD), predominantly inattentive type 07/04/2013  . Iron deficiency anemia 04/05/2013  . H/O gastric bypass 03/06/2012  . Malignant neoplasm of upper-outer quadrant of right breast in female, estrogen receptor positive ([facility] [place] 03/04/2012  . Osteopenia 03/04/2012   JKerin Perna PTA 08/09/18 1:17 PM  [place] [address]Phone: [phone]  Fax:  [phone] Name: Christy BUSSERMRN: [id number] of Birth: [birth year]

## 2018-08-10 ENCOUNTER — Encounter: Payer: PRIVATE HEALTH INSURANCE | Admitting: Physical Therapy

## 2018-08-11 ENCOUNTER — Other Ambulatory Visit: Payer: Self-pay | Admitting: Family Medicine

## 2018-08-11 DIAGNOSIS — F900 Attention-deficit hyperactivity disorder, predominantly inattentive type: Secondary | ICD-10-CM

## 2018-08-11 NOTE — Telephone Encounter (Signed)
Christy [name] is requesting to fill pt vyvanse . Please advise [facility]

## 2018-08-17 ENCOUNTER — Ambulatory Visit (INDEPENDENT_AMBULATORY_CARE_PROVIDER_SITE_OTHER): Payer: PRIVATE HEALTH INSURANCE | Admitting: Physical Therapy

## 2018-08-17 ENCOUNTER — Encounter: Payer: Self-pay | Admitting: Physical Therapy

## 2018-08-17 DIAGNOSIS — M25562 Pain in left knee: Secondary | ICD-10-CM

## 2018-08-17 DIAGNOSIS — M25662 Stiffness of left knee, not elsewhere classified: Secondary | ICD-10-CM

## 2018-08-17 DIAGNOSIS — M6281 Muscle weakness (generalized): Secondary | ICD-10-CM

## 2018-08-17 NOTE — Therapy (Signed)
[place], [place], [address] Phone: [phone]   Fax:  [phone]  Physical Therapy Treatment  Patient Details  Name: Christy [name] MRN: [id number] Date of Birth: 25-Oct-1956 Referring Provider: Dr. Mayer Camel    Encounter Date: 08/17/2018  PT End of Session - 08/17/18 0942    Visit Number  11    Number of Visits  24    Date for PT Re-Evaluation  09/23/18    PT Start Time  0939    PT Stop Time  1017    PT Time Calculation (min)  38 min    Activity Tolerance  Patient tolerated treatment well    Behavior During Therapy  [facility] for tasks assessed/performed       Past Medical History:  Diagnosis Date  . ADD (attention deficit disorder)   . Allergy    rhinitis  . Anemia   . Arthritis    osteo  . Breast cancer (HCC)    Breast , skin - basal   . Complication of anesthesia    gastric bypass [facility] ,  acetyl succ-, hard time awaking, placed back on vent for a couiple hour  . Dyspnea   . GERD (gastroesophageal reflux disease)   . History of hiatal hernia   . History of kidney stones    passed  . Hypothyroid   . Internal hemorrhoids   . Obesity   . Pneumonia   . Tubular adenoma of colon     Past Surgical History:  Procedure Laterality Date  . BREAST LUMPECTOMY Right   . CESAREAN SECTION     x 2  . CHOLECYSTECTOMY    . COLONOSCOPY    . ESOPHAGOGASTRODUODENOSCOPY    . GASTRIC BYPASS    . JOINT REPLACEMENT    . KNEE ARTHROSCOPY Right    x 3  . LAPAROSCOPIC APPENDECTOMY N/A 04/03/2015   Procedure: APPENDECTOMY LAPAROSCOPIC;  Surgeon: Autumn Messing III, MD;  [facility]: [facility];  Service: General;  Laterality: N/A;  . LITHOTRIPSY     x3  . MASTECTOMY Bilateral    with lymph node dissection  bil reconstruction 2009  . TOTAL KNEE ARTHROPLASTY Right 12/18/2013   Procedure: TOTAL KNEE ARTHROPLASTY;  Surgeon: Kerin Salen, MD;  Location: [place];  Service: Orthopedics;  Laterality: Right;   . TOTAL KNEE ARTHROPLASTY Left 06/29/2018  . TOTAL KNEE ARTHROPLASTY Left 06/29/2018   Procedure: LEFT TOTAL KNEE ARTHROPLASTY;  Surgeon: Frederik Pear, MD;  Location: [place];  Service: Orthopedics;  Laterality: Left;  Marland Kitchen VAGINAL HYSTERECTOMY      There were no vitals filed for this visit.  Subjective Assessment - 08/17/18 0942    Subjective  Pt reports she decdied to decrease her Celebrex two days ago. She has noticed she has been locking legs at work.  Next day she had 9/10 pain in her Lt quad.   She has returned to taking normal dosage of Celebrex    Patient Stated Goals  get the Lt knee working like the Rt, improve posture    Currently in Pain?  Yes    Pain Score  5     Pain Location  Leg   thigh    Pain Orientation  Left    Pain Descriptors / Indicators  Sharp;Tightness         [facility] PT Assessment - 08/17/18 0001      Assessment   Medical Diagnosis  Lt TKA    Referring Provider  Dr. Mayer Camel     Onset Date/Surgical Date  06/29/18       [facility] Adult PT Treatment/Exercise - 08/17/18 0001      Knee/Hip Exercises: Stretches   Quad Stretch  Left;5 reps;30 seconds   4 in prone with strap, 1 in seated position     Knee/Hip Exercises: Aerobic   Recumbent Bike  6 min, L1    PTA present to discuss progress.      Knee/Hip Exercises: Standing   SLS  Lt 30 sec X 3 on foam    Other Standing Knee Exercises  tandem stance on airx pad x 30 sec x 2 reps, repeated with horiz head turns.    standing to high kneeling (onto Airex pad) and then returning (for review of use at home); standing on airex pad with bilat shoulder row with green band x 15 reps       Shoulder Exercises: Stretch   Other Shoulder Stretches  3 position pec stretch, performed unilateral x 2 reps each;  shoulder ext stretch with hands laced behind       Modalities   Modalities  --   pt declined      Manual Therapy   Soft tissue mobilization  IASTM and STM to Lt quad to decrease pain, fascial restriction and improve ROM                 PT Short Term Goals - 08/09/18 1317      PT SHORT TERM GOAL #1   Title  Patient independent in initial HEP 08/12/18    Time  6    Period  Weeks    Status  Achieved      PT SHORT TERM GOAL #2   Title  Increase AROM Rt knee to (-) 2 deg to 105 deg 08/13/18    Time  6    Period  Weeks    Status  Achieved      PT SHORT TERM GOAL #3   Title  Progress to single point cane for ambulation with good gait pattern 08/13/18    Time  6    Period  Weeks    Status  Achieved        PT Long Term Goals - 08/09/18 1118      PT LONG TERM GOAL #1   Title  5-/5 to 5/5 strength  LE 09/23/18    Time  12    Period  Weeks    Status  Achieved      PT LONG TERM GOAL #2   Title  AROM Lt knee 0 to 115 degrees 09/23/18    Time  12    Period  Weeks    Status  Achieved      PT LONG TERM GOAL #3   Title  independent gait without assistive device for community distances 09/23/18    Time  12    Period  Weeks    Status  Achieved      PT LONG TERM GOAL #4   Title  Independent in HEP 09/23/18    Time  12    Period  Weeks    Status  On-going      PT LONG TERM GOAL #5   Title  Improve FOTO to </= 60% limitation 09/23/18    Time  12    Period  Weeks    Status  On-going      PT LONG TERM GOAL #6   Title  Improve posture  and alignment with patient to demonstrate improved upright posture with posterior shoulder girdle engaged 09/23/18    Time  12    Period  Weeks    Status  On-going      PT LONG TERM GOAL #7   Title  Independent in HEP for stretching/strengthening to improve thoracic alignment and muscular balance therefore decreasing thoracic pain and discomfort 09/23/18    Time  12    Period  Weeks    Status  On-going            Plan - 08/17/18 1238    Clinical Impression Statement  Pt arrived with very tight and painful Lt quad.  Initially less than 80 deg with quad stretch, but improved to >100 deg after a few reps.  Pt reported significant reduction in pain and  tightness with manual therapy and exercises.  Pt declined modalities at end of session.  Pt's balance much improved from last session.  Progressing well towards remaining goals.     Rehab Potential  Good    PT Frequency  2x / week    PT Duration  12 weeks    PT Treatment/Interventions  Patient/family education;ADLs/Self Care Home Management;Cryotherapy;Electrical Stimulation;Iontophoresis 4mg /ml Dexamethasone;Moist Heat;Ultrasound;Gait training;Stair training;Functional mobility training;Therapeutic activities;Therapeutic exercise;Balance training;Neuromuscular re-education;Scar mobilization;Manual techniques;Dry needling    PT Next Visit Plan  balance, postural strengthening (prone on pball). discuss walking program for endurance.     Consulted and Agree with Plan of Care  Patient       Patient will benefit from skilled therapeutic intervention in order to improve the following deficits and impairments:  Abnormal gait, Decreased balance, Decreased mobility, Decreased range of motion, Decreased strength, Increased edema, Pain, Postural dysfunction, Improper body mechanics  Visit Diagnosis: Acute pain of left knee  Stiffness of left knee, not elsewhere classified  Muscle weakness (generalized)     Problem List Patient Active Problem List   Diagnosis Date Noted  . Primary osteoarthritis of left knee 06/29/2018  . Degenerative arthritis of left knee 06/07/2018  . S/P TKR (total knee replacement) using cement 03/12/2016  . Hypothyroidism 03/12/2016  . Solar lentigo 03/12/2016  . Attention deficit hyperactivity disorder (ADHD), predominantly inattentive type 07/04/2013  . Iron deficiency anemia 04/05/2013  . H/O gastric bypass 03/06/2012  . Malignant neoplasm of upper-outer quadrant of right breast in female, estrogen receptor positive ([place]) 03/04/2012  . Osteopenia 03/04/2012   Kerin Perna, PTA 08/17/18 12:49 PM  [place], [place], [address] Phone: [phone]   Fax:  [phone]  Name: MILANIA HAUBNER MRN: [id number] Date of Birth: [birth year]

## 2018-08-18 ENCOUNTER — Ambulatory Visit (INDEPENDENT_AMBULATORY_CARE_PROVIDER_SITE_OTHER): Payer: PRIVATE HEALTH INSURANCE | Admitting: Physical Therapy

## 2018-08-18 DIAGNOSIS — M25662 Stiffness of left knee, not elsewhere classified: Secondary | ICD-10-CM | POA: Diagnosis not present

## 2018-08-18 DIAGNOSIS — M25562 Pain in left knee: Secondary | ICD-10-CM

## 2018-08-18 DIAGNOSIS — R601 Generalized edema: Secondary | ICD-10-CM
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
## 2018-08-22 ENCOUNTER — Encounter: Payer: Self-pay | Admitting: Rehabilitative and Restorative Service Providers"

## 2018-08-22 ENCOUNTER — Ambulatory Visit (INDEPENDENT_AMBULATORY_CARE_PROVIDER_SITE_OTHER): Payer: PRIVATE HEALTH INSURANCE | Admitting: Rehabilitative and Restorative Service Providers"

## 2018-08-22 DIAGNOSIS — R29898 Other symptoms and signs involving the musculoskeletal system: Secondary | ICD-10-CM

## 2018-08-22 DIAGNOSIS — M6281 Muscle weakness (generalized): Secondary | ICD-10-CM

## 2018-08-22 DIAGNOSIS — M25562 Pain in left knee: Secondary | ICD-10-CM | POA: Diagnosis not present

## 2018-08-22 DIAGNOSIS — M25662 Stiffness of left knee, not elsewhere classified: Secondary | ICD-10-CM

## 2018-08-22 DIAGNOSIS — R601 Generalized edema: Secondary | ICD-10-CM | POA: Diagnosis not present

## 2018-08-22 DIAGNOSIS — R2689 Other abnormalities of gait and mobility: Secondary | ICD-10-CM

## 2018-08-22 NOTE — Therapy (Signed)
[place], [place], [address] Phone: [phone]   Fax:  [phone]  Physical Therapy Treatment  Patient Details  Name: Christy [name] MRN: [id number] Date of Birth: [birth year] Referring Provider: Dr Frederik Pear    Encounter Date: 08/22/2018  PT End of Session - 08/22/18 1626    Visit Number  13    Date for PT Re-Evaluation  09/23/18    PT Start Time  1601    PT Stop Time  1643    PT Time Calculation (min)  42 min    Activity Tolerance  Patient tolerated treatment well       Past Medical History:  Diagnosis Date  . ADD (attention deficit disorder)   . Allergy    rhinitis  . Anemia   . Arthritis    osteo  . Breast cancer (HCC)    Breast , skin - basal   . Complication of anesthesia    gastric bypass [facility] ,  acetyl succ-, hard time awaking, placed back on vent for a couiple hour  . Dyspnea   . GERD (gastroesophageal reflux disease)   . History of hiatal hernia   . History of kidney stones    passed  . Hypothyroid   . Internal hemorrhoids   . Obesity   . Pneumonia   . Tubular adenoma of colon     Past Surgical History:  Procedure Laterality Date  . BREAST LUMPECTOMY Right   . CESAREAN SECTION     x 2  . CHOLECYSTECTOMY    . COLONOSCOPY    . ESOPHAGOGASTRODUODENOSCOPY    . GASTRIC BYPASS    . JOINT REPLACEMENT    . KNEE ARTHROSCOPY Right    x 3  . LAPAROSCOPIC APPENDECTOMY N/A 04/03/2015   Procedure: APPENDECTOMY LAPAROSCOPIC;  Surgeon: Autumn Messing III, MD;  [facility];  Service: General;  Laterality: N/A;  . LITHOTRIPSY     x3  . MASTECTOMY Bilateral    with lymph node dissection  bil reconstruction 2009  . TOTAL KNEE ARTHROPLASTY Right 12/18/2013   Procedure: TOTAL KNEE ARTHROPLASTY;  Surgeon: Kerin Salen, MD;  Location: [place];  Service: Orthopedics;  Laterality: Right;  . TOTAL KNEE ARTHROPLASTY Left 06/29/2018  . TOTAL KNEE ARTHROPLASTY Left  06/29/2018   Procedure: LEFT TOTAL KNEE ARTHROPLASTY;  Surgeon: Frederik Pear, MD;  Location: [place];  Service: Orthopedics;  Laterality: Left;  Marland Kitchen VAGINAL HYSTERECTOMY      There were no vitals filed for this visit.  Subjective Assessment - 08/22/18 1607    Subjective  Patient reports some fatigue with returning to work; mild discomfort at times. Concerned about her endurance.     Currently in Pain?  Yes    Pain Score  1     Pain Location  Knee    Pain Orientation  Left    Pain Descriptors / Indicators  Tightness    Pain Type  Surgical pain         [facility] PT Assessment - 08/22/18 0001      Assessment   Medical Diagnosis  Lt TKA    Referring Provider  Dr Frederik Pear     Onset Date/Surgical Date  06/29/18    Hand Dominance  Right    Next MD Visit  9/19    Prior Therapy  hospital post surgery       AROM   Left Knee Extension  0    Left Knee  Flexion  127      Strength   Right Hip Flexion  5/5    Right Hip Extension  5/5    Right Hip ABduction  5/5    Right Knee Flexion  5/5    Right Knee Extension  5/5                   [facility] Adult PT Treatment/Exercise - 08/22/18 0001      Knee/Hip Exercises: Stretches   Passive Hamstring Stretch  Left;2 reps;30 seconds    Quad Stretch  Left;3 reps;30 seconds      Knee/Hip Exercises: Aerobic   Elliptical  L1 x 2 min     Tread Mill  1.5 - 2.0 mph x 8 min     Nustep  L5 x 6 min       Knee/Hip Exercises: Standing   Heel Raises  Both;10 reps    Lateral Step Up  Left;1 set;20 reps;Hand Hold: 1;Step Height: 6"    Forward Step Up  Left;1 set;20 reps;Hand Hold: 1;Step Height: 6"    SLS  Lt 30 sec X 3 on foam    Other Standing Knee Exercises  tandem stance  x 30 sec x 2 reps each foot forward       Knee/Hip Exercises: Seated   Sit to Sand  10 reps;without UE support   slow descent holding and lowering slowly               PT Short Term Goals - 08/09/18 1317      PT SHORT TERM GOAL #1   Title  Patient independent  in initial HEP 08/12/18    Time  6    Period  Weeks    Status  Achieved      PT SHORT TERM GOAL #2   Title  Increase AROM Rt knee to (-) 2 deg to 105 deg 08/13/18    Time  6    Period  Weeks    Status  Achieved      PT SHORT TERM GOAL #3   Title  Progress to single point cane for ambulation with good gait pattern 08/13/18    Time  6    Period  Weeks    Status  Achieved        PT Long Term Goals - 08/22/18 1606      PT LONG TERM GOAL #1   Title  5-/5 to 5/5 strength  LE 09/23/18    Time  12    Period  Weeks    Status  Achieved      PT LONG TERM GOAL #2   Title  AROM Lt knee 0 to 115 degrees 09/23/18    Time  12    Period  Weeks    Status  Achieved      PT LONG TERM GOAL #3   Title  independent gait without assistive device for community distances 09/23/18    Time  12    Period  Weeks    Status  Achieved      PT LONG TERM GOAL #4   Title  Independent in HEP 09/23/18    Time  12    Period  Weeks    Status  On-going      PT LONG TERM GOAL #5   Title  Improve FOTO to </= 60% limitation 09/23/18    Time  12    Period  Weeks    Status  On-going  PT LONG TERM GOAL #6   Title  Improve posture and alignment with patient to demonstrate improved upright posture with posterior shoulder girdle engaged 09/23/18    Time  12    Period  Weeks    Status  On-going      PT LONG TERM GOAL #7   Title  Independent in HEP for stretching/strengthening to improve thoracic alignment and muscular balance therefore decreasing thoracic pain and discomfort 09/23/18    Time  12    Period  Weeks    Status  On-going            Plan - 08/22/18 1627    Clinical Impression Statement  Patient continues to progress well with knee rehab and return to work. She is concerned with her endurance. Increased aerobic exercise today without difficulty. Progressing well with knee rehab.     Rehab Potential  Good    PT Frequency  2x / week    PT Duration  12 weeks    PT Treatment/Interventions   Patient/family education;ADLs/Self Care Home Management;Cryotherapy;Electrical Stimulation;Iontophoresis 4mg /ml Dexamethasone;Moist Heat;Ultrasound;Gait training;Stair training;Functional mobility training;Therapeutic activities;Therapeutic exercise;Balance training;Neuromuscular re-education;Scar mobilization;Manual techniques;Dry needling    PT Next Visit Plan  balance, postural strengthening (prone on pball). discuss walking program for endurance.     Consulted and Agree with Plan of Care  Patient       Patient will benefit from skilled therapeutic intervention in order to improve the following deficits and impairments:  Abnormal gait, Decreased balance, Decreased mobility, Decreased range of motion, Decreased strength, Increased edema, Pain, Postural dysfunction, Improper body mechanics  Visit Diagnosis: Acute pain of left knee  Stiffness of left knee, not elsewhere classified  Generalized edema  Muscle weakness (generalized)  Other abnormalities of gait and mobility  Other symptoms and signs involving the musculoskeletal system     Problem List Patient Active Problem List   Diagnosis Date Noted  . Primary osteoarthritis of left knee 06/29/2018  . Degenerative arthritis of left knee 06/07/2018  . S/P TKR (total knee replacement) using cement 03/12/2016  . Hypothyroidism 03/12/2016  . Solar lentigo 03/12/2016  . Attention deficit hyperactivity disorder (ADHD), predominantly inattentive type 07/04/2013  . Iron deficiency anemia 04/05/2013  . H/O gastric bypass 03/06/2012  . Malignant neoplasm of upper-outer quadrant of right breast in female, estrogen receptor positive ([place]) 03/04/2012  . Osteopenia 03/04/2012    Celyn Nilda Simmer PT, MPH  08/22/2018, 4:44 PM  [facility] [place], [place], [address] Phone: [phone]   Fax:  [phone]  Name: Christy [name] MRN: [id number] Date of Birth:  01/22/1956

## 2018-08-25 ENCOUNTER — Encounter: Payer: Self-pay | Admitting: Family Medicine

## 2018-08-26 ENCOUNTER — Encounter: Payer: PRIVATE HEALTH INSURANCE | Admitting: Rehabilitative and Restorative Service Providers"

## 2018-09-02 ENCOUNTER — Ambulatory Visit (INDEPENDENT_AMBULATORY_CARE_PROVIDER_SITE_OTHER): Payer: PRIVATE HEALTH INSURANCE | Admitting: Physical Therapy

## 2018-09-02 DIAGNOSIS — R601 Generalized edema: Secondary | ICD-10-CM | POA: Diagnosis not present

## 2018-09-02 DIAGNOSIS — M25562 Pain in left knee: Secondary | ICD-10-CM

## 2018-09-02 DIAGNOSIS — M25662 Stiffness of left knee, not elsewhere classified: Secondary | ICD-10-CM

## 2018-09-02 NOTE — Therapy (Signed)
[place], [place], [address] Phone: [phone]   Fax:  [phone]  Physical Therapy Treatment  Patient Details  Name: Christy [name] MRN: [id number] Date of Birth: 1956-07-06 Referring Provider: Dr Frederik Pear    Encounter Date: 09/02/2018  PT End of Session - 09/02/18 1614    Visit Number  14    Number of Visits  24    Date for PT Re-Evaluation  09/23/18    PT Start Time  0086    PT Stop Time  1623    PT Time Calculation (min)  38 min    Activity Tolerance  Patient tolerated treatment well    Behavior During Therapy  [facility] for tasks assessed/performed       Past Medical History:  Diagnosis Date  . ADD (attention deficit disorder)   . Allergy    rhinitis  . Anemia   . Arthritis    osteo  . Breast cancer (HCC)    Breast , skin - basal   . Complication of anesthesia    gastric bypass [facility] ,  acetyl succ-, hard time awaking, placed back on vent for a couiple hour  . Dyspnea   . GERD (gastroesophageal reflux disease)   . History of hiatal hernia   . History of kidney stones    passed  . Hypothyroid   . Internal hemorrhoids   . Obesity   . Pneumonia   . Tubular adenoma of colon     Past Surgical History:  Procedure Laterality Date  . BREAST LUMPECTOMY Right   . CESAREAN SECTION     x 2  . CHOLECYSTECTOMY    . COLONOSCOPY    . ESOPHAGOGASTRODUODENOSCOPY    . GASTRIC BYPASS    . JOINT REPLACEMENT    . KNEE ARTHROSCOPY Right    x 3  . LAPAROSCOPIC APPENDECTOMY N/A 04/03/2015   Procedure: APPENDECTOMY LAPAROSCOPIC;  Surgeon: Autumn Messing III, MD;  Location: [facility];  Service: General;  Laterality: N/A;  . LITHOTRIPSY     x3  . MASTECTOMY Bilateral    with lymph node dissection  bil reconstruction 2009  . TOTAL KNEE ARTHROPLASTY Right 12/18/2013   Procedure: TOTAL KNEE ARTHROPLASTY;  Surgeon: Kerin Salen, MD;  Location: [place];  Service: Orthopedics;  Laterality:  Right;  . TOTAL KNEE ARTHROPLASTY Left 06/29/2018  . TOTAL KNEE ARTHROPLASTY Left 06/29/2018   Procedure: LEFT TOTAL KNEE ARTHROPLASTY;  Surgeon: Frederik Pear, MD;  Location: [place];  Service: Orthopedics;  Laterality: Left;  Marland Kitchen VAGINAL HYSTERECTOMY      There were no vitals filed for this visit.  Subjective Assessment - 09/02/18 1549    Subjective  Pt reports her Lt knee is very sore today.  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     Patient Stated Goals  get the Lt knee working like the Rt, improve posture    Currently in Pain?  Yes    Pain Score  4    was 8/10 upon waking    Pain Location  Knee    Pain Orientation  Left    Pain Descriptors / Indicators  Sore;Tightness    Aggravating Factors   kneeling    Pain Relieving Factors  ice, medicine         [facility] PT Assessment - 09/02/18 0001      Flexibility  Soft Tissue Assessment /Muscle Length  yes    Hamstrings  --    Quadriceps  Lt 105-110 deg        [facility] Adult PT Treatment/Exercise - 09/02/18 0001      Knee/Hip Exercises: Stretches   Passive Hamstring Stretch  Right;Left;2 reps    Sports administrator  Right;Left;3 reps;30 seconds      Knee/Hip Exercises: Aerobic   Recumbent Bike  L2: 5 min       Vasopneumatic   Number Minutes Vasopneumatic   15 minutes    Vasopnuematic Location   Knee   Lt   Vasopneumatic Pressure  Medium    Vasopneumatic Temperature   34 deg      Manual Therapy   Manual therapy comments  big daddy Rock tape squids applied to ant Lt knee in basket weave to decrease pain and edema.     Soft tissue mobilization  STM to Lt quad and IT band to decrease pain, fascial restriction and improve ROM         PT Short Term Goals - 08/09/18 1317      PT SHORT TERM GOAL #1   Title  Patient independent in initial HEP 08/12/18    Time  6    Period  Weeks    Status  Achieved      PT SHORT TERM GOAL #2   Title  Increase AROM Rt knee to (-) 2 deg to 105  deg 08/13/18    Time  6    Period  Weeks    Status  Achieved      PT SHORT TERM GOAL #3   Title  Progress to single point cane for ambulation with good gait pattern 08/13/18    Time  6    Period  Weeks    Status  Achieved        PT Long Term Goals - 08/22/18 1606      PT LONG TERM GOAL #1   Title  5-/5 to 5/5 strength  LE 09/23/18    Time  12    Period  Weeks    Status  Achieved      PT LONG TERM GOAL #2   Title  AROM Lt knee 0 to 115 degrees 09/23/18    Time  12    Period  Weeks    Status  Achieved      PT LONG TERM GOAL #3   Title  independent gait without assistive device for community distances 09/23/18    Time  12    Period  Weeks    Status  Achieved      PT LONG TERM GOAL #4   Title  Independent in HEP 09/23/18    Time  12    Period  Weeks    Status  On-going      PT LONG TERM GOAL #5   Title  Improve FOTO to </= 60% limitation 09/23/18    Time  12    Period  Weeks    Status  On-going      PT LONG TERM GOAL #6   Title  Improve posture and alignment with patient to demonstrate improved upright posture with posterior shoulder girdle engaged 09/23/18    Time  12    Period  Weeks    Status  On-going      PT LONG TERM GOAL #7   Title  Independent in HEP for stretching/strengthening to improve thoracic alignment and muscular balance therefore decreasing  thoracic pain and discomfort 09/23/18    Time  12    Period  Weeks    Status  On-going            Plan - 09/02/18 1551    Clinical Impression Statement  Pt arrived 15 min late to appt.  She is reporting tenderness in Lt knee from prolonged kneeling at work; visible swelling present. Session focused on stretching of LE and measures for pain/ edema.      Rehab Potential  Good    PT Frequency  2x / week    PT Duration  12 weeks    PT Treatment/Interventions  Patient/family education;ADLs/Self Care Home Management;Cryotherapy;Electrical Stimulation;Iontophoresis 4mg /ml Dexamethasone;Moist Heat;Ultrasound;Gait  training;Stair training;Functional mobility training;Therapeutic activities;Therapeutic exercise;Balance training;Neuromuscular re-education;Scar mobilization;Manual techniques;Dry needling    PT Next Visit Plan  balance, postural strengthening.     Consulted and Agree with Plan of Care  Patient       Patient will benefit from skilled therapeutic intervention in order to improve the following deficits and impairments:  Abnormal gait, Decreased balance, Decreased mobility, Decreased range of motion, Decreased strength, Increased edema, Pain, Postural dysfunction, Improper body mechanics  Visit Diagnosis: Acute pain of left knee  Stiffness of left knee, not elsewhere classified  Generalized edema     Problem List Patient Active Problem List   Diagnosis Date Noted  . Primary osteoarthritis of left knee 06/29/2018  . Degenerative arthritis of left knee 06/07/2018  . S/P TKR (total knee replacement) using cement 03/12/2016  . Hypothyroidism 03/12/2016  . Solar lentigo 03/12/2016  . Attention deficit hyperactivity disorder (ADHD), predominantly inattentive type 07/04/2013  . Iron deficiency anemia 04/05/2013  . H/O gastric bypass 03/06/2012  . Malignant neoplasm of upper-outer quadrant of right breast in female, estrogen receptor positive ([place]) 03/04/2012  . Osteopenia 03/04/2012   Kerin Perna, PTA 09/02/18 4:18 PM  [facility] [place], [place], [address] Phone: [phone]   Fax:  [phone]  Name: Christy [name] MRN: [id number] Date of Birth: [birth year]

## 2018-09-05 ENCOUNTER — Encounter: Payer: PRIVATE HEALTH INSURANCE | Admitting: Physical Therapy

## 2018-09-09 ENCOUNTER — Encounter: Payer: PRIVATE HEALTH INSURANCE | Admitting: Physical Therapy

## 2018-09-12 ENCOUNTER — Ambulatory Visit (INDEPENDENT_AMBULATORY_CARE_PROVIDER_SITE_OTHER): Payer: PRIVATE HEALTH INSURANCE | Admitting: Physical Therapy

## 2018-09-12 DIAGNOSIS — M25562 Pain in left knee: Secondary | ICD-10-CM | POA: Diagnosis not present

## 2018-09-12 DIAGNOSIS — M25662 Stiffness of left knee, not elsewhere classified: Secondary | ICD-10-CM | POA: Diagnosis not present

## 2018-09-12 DIAGNOSIS — M6281 Muscle weakness (generalized): Secondary | ICD-10-CM

## 2018-09-12 NOTE — Patient Instructions (Signed)
Over Head Pull: Narrow Grip     [place] [phone]   On back, knees bent, feet flat, band across thighs, elbows straight but relaxed. Pull hands apart (start). Keeping elbows straight, bring arms up and over head, hands toward floor. Keep pull steady on band. Hold momentarily. Return slowly, keeping pull steady, back to start. Repeat _10__ times. Band color __red ____   Side Pull: Double Arm   On back, knees bent, feet flat. Arms perpendicular to body, shoulder level, elbows straight but relaxed. Pull arms out to sides, elbows straight. Resistance band comes across collarbones, hands toward floor. Hold momentarily. Slowly return to starting position. Repeat __10_ times. Band color _green____   Sash   On back, knees bent, feet flat, left hand on left hip, right hand above left. Pull right arm DIAGONALLY (hip to shoulder) across chest. Bring right arm along head toward floor. Hold momentarily. Slowly return to starting position. Repeat __10_ times. Do with left arm. Band color __green____   Shoulder Rotation: Double Arm   On back, knees bent, feet flat, elbows tucked at sides, bent 90, hands palms up. Pull hands apart and down toward floor, keeping elbows near sides. Hold momentarily. Slowly return to starting position. Repeat __10_ times. Band color ___red___

## 2018-09-12 NOTE — Therapy (Addendum)
[place], [place], [address] Phone: [phone]   Fax:  [phone]  Physical Therapy Treatment  Patient Details  Name: Christy [name] MRN: [id number] Date of Birth: July 02, 1956 Referring Provider: Dr. Frederik Pear   Encounter Date: 09/12/2018  PT End of Session - 09/12/18 1531    Visit Number  15    Number of Visits  24    Date for PT Re-Evaluation  09/23/18    PT Start Time  4270   pt arrived late   PT Stop Time  1603    PT Time Calculation (min)  38 min    Activity Tolerance  Patient tolerated treatment well    Behavior During Therapy  [facility] for tasks assessed/performed       Past Medical History:  Diagnosis Date  . ADD (attention deficit disorder)   . Allergy    rhinitis  . Anemia   . Arthritis    osteo  . Breast cancer (HCC)    Breast , skin - basal   . Complication of anesthesia    gastric bypass [facility] ,  acetyl succ-, hard time awaking, placed back on vent for a couiple hour  . Dyspnea   . GERD (gastroesophageal reflux disease)   . History of hiatal hernia   . History of kidney stones    passed  . Hypothyroid   . Internal hemorrhoids   . Obesity   . Pneumonia   . Tubular adenoma of colon     Past Surgical History:  Procedure Laterality Date  . BREAST LUMPECTOMY Right   . CESAREAN SECTION     x 2  . CHOLECYSTECTOMY    . COLONOSCOPY    . ESOPHAGOGASTRODUODENOSCOPY    . GASTRIC BYPASS    . JOINT REPLACEMENT    . KNEE ARTHROSCOPY Right    x 3  . LAPAROSCOPIC APPENDECTOMY N/A 04/03/2015   Procedure: APPENDECTOMY LAPAROSCOPIC;  Surgeon: Autumn Messing III, MD;  Location: [facility];  Service: General;  Laterality: N/A;  . LITHOTRIPSY     x3  . MASTECTOMY Bilateral    with lymph node dissection  bil reconstruction 2009  . TOTAL KNEE ARTHROPLASTY Right 12/18/2013   Procedure: TOTAL KNEE ARTHROPLASTY;  Surgeon: Kerin Salen, MD;  Location: [facility];  Service:  Orthopedics;  Laterality: Right;  . TOTAL KNEE ARTHROPLASTY Left 06/29/2018  . TOTAL KNEE ARTHROPLASTY Left 06/29/2018   Procedure: LEFT TOTAL KNEE ARTHROPLASTY;  Surgeon: Frederik Pear, MD;  Location: [place];  Service: Orthopedics;  Laterality: Left;  Marland Kitchen VAGINAL HYSTERECTOMY      There were no vitals filed for this visit.  Subjective Assessment - 09/12/18 1533    Subjective  Pt reports it has been struggling to get here with work.  She feels comfortable d/c after today's visit.  she has questions regarding transitioning to gym program.        Pertinent History  Rt knee injury 1974 - several surgerires on Rt knee creating stress on the Lt; Rt TKA 2014; denies any other medical problems; osteopenia; bilat mastectomy 2009     Currently in Pain?  No/denies    Pain Score  0-No pain         [facility] PT Assessment - 09/12/18 0001      Assessment   Medical Diagnosis  Lt TKA    Referring Provider  Dr. Frederik Pear    Onset Date/Surgical Date  06/29/18    Hand  Dominance  Right    Next MD Visit  9/19      Observation/Other Assessments   Focus on Therapeutic Outcomes (FOTO)   1% limited         [facility] Adult PT Treatment/Exercise - 09/12/18 0001      Knee/Hip Exercises: Stretches   Passive Hamstring Stretch  Right;Left;3 reps;30 seconds    Other Knee/Hip Stretches  piriformis stretch  Each leg 3 times for 30 sec     Knee/Hip Exercises: Aerobic   Recumbent Bike  L2: 13.5 min    PTA present to discuss exercise parameters, etc     Shoulder Exercises: Supine   Horizontal ABduction  Strengthening;Both;12 reps;Theraband    Theraband Level (Shoulder Horizontal ABduction)  Level 3 (Green)    External Rotation  Both;10 reps;Theraband    Theraband Level (Shoulder External Rotation)  Level 2 (Red)    Flexion  Strengthening;Both;10 reps;Theraband   overhead pull   Theraband Level (Shoulder Flexion)  Level 2 (Red)    Diagonals  Strengthening;Right;Left;10 reps;Theraband    Theraband Level (Shoulder  Diagonals)  Level 3 Nyoka Cowden)               PT Short Term Goals - 08/09/18 1317      PT SHORT TERM GOAL #1   Title  Patient independent in initial HEP 08/12/18    Time  6    Period  Weeks    Status  Achieved      PT SHORT TERM GOAL #2   Title  Increase AROM Rt knee to (-) 2 deg to 105 deg 08/13/18    Time  6    Period  Weeks    Status  Achieved      PT SHORT TERM GOAL #3   Title  Progress to single point cane for ambulation with good gait pattern 08/13/18    Time  6    Period  Weeks    Status  Achieved        PT Long Term Goals - 09/12/18 1544      PT LONG TERM GOAL #1   Title  5-/5 to 5/5 strength  LE 09/23/18    Time  12    Period  Weeks    Status  Achieved      PT LONG TERM GOAL #2   Title  AROM Lt knee 0 to 115 degrees 09/23/18    Time  12    Period  Weeks    Status  Achieved      PT LONG TERM GOAL #3   Title  independent gait without assistive device for community distances 09/23/18    Time  12    Period  Weeks    Status  Achieved      PT LONG TERM GOAL #4   Title  Independent in HEP 09/23/18    Time  12    Period  Weeks    Status  Achieved      PT LONG TERM GOAL #5   Title  Improve FOTO to </= 60% limitation 09/23/18    Status  Achieved      PT LONG TERM GOAL #6   Title  Improve posture and alignment with patient to demonstrate improved upright posture with posterior shoulder girdle engaged 09/23/18    Time  12    Period  Weeks    Status  Achieved      PT LONG TERM GOAL #7   Title  Independent in HEP for  stretching/strengthening to improve thoracic alignment and muscular balance therefore decreasing thoracic pain and discomfort 09/23/18    Time  12    Period  Weeks    Status  Achieved            Plan - 09/12/18 1557    Clinical Impression Statement  Pt tolerated all exercises without any increase in symptoms, reporting decreased stiffness with use of bike. Pt has met all goals and requests to d/c at this time.     Rehab Potential  Good     PT Frequency  2x / week    PT Duration  12 weeks    PT Treatment/Interventions  Patient/family education;ADLs/Self Care Home Management;Cryotherapy;Electrical Stimulation;Iontophoresis 39m/ml Dexamethasone;Moist Heat;Ultrasound;Gait training;Stair training;Functional mobility training;Therapeutic activities;Therapeutic exercise;Balance training;Neuromuscular re-education;Scar mobilization;Manual techniques;Dry needling    PT Next Visit Plan  spoke to supervising PT; will d/c at this time.     Consulted and Agree with Plan of Care  Patient       Patient will benefit from skilled therapeutic intervention in order to improve the following deficits and impairments:  Abnormal gait, Decreased balance, Decreased mobility, Decreased range of motion, Decreased strength, Increased edema, Pain, Postural dysfunction, Improper body mechanics  Visit Diagnosis: Acute pain of left knee  Stiffness of left knee, not elsewhere classified  Muscle weakness (generalized)     Problem List Patient Active Problem List   Diagnosis Date Noted  . Primary osteoarthritis of left knee 06/29/2018  . Degenerative arthritis of left knee 06/07/2018  . S/P TKR (total knee replacement) using cement 03/12/2016  . Hypothyroidism 03/12/2016  . Solar lentigo 03/12/2016  . Attention deficit hyperactivity disorder (ADHD), predominantly inattentive type 07/04/2013  . Iron deficiency anemia 04/05/2013  . H/O gastric bypass 03/06/2012  . Malignant neoplasm of upper-outer quadrant of right breast in female, estrogen receptor positive ([place] 03/04/2012  . Osteopenia 03/04/2012   JKerin Perna PTA 09/12/18 4:57 PM    [facility] [place] [address] [address]Phone: [phone]  Fax:  [phone] Name: Christy SWANGOMRN: [id number] of Birth: 512-13-1957 PHYSICAL THERAPY DISCHARGE SUMMARY  Visits from Start of Care: 15  Current  functional level related to goals / functional outcomes: See progress note for discharge status    Remaining deficits: Unknown    Education / Equipment: HEP  Plan: Patient agrees to discharge.  Patient goals were not met. Patient is being discharged due to meeting the stated rehab goals.  ?????     Celyn P. HHelene KelpPT, MPH 10/12/18 10:44 AM

## 2018-10-19 ENCOUNTER — Inpatient Hospital Stay: Payer: PRIVATE HEALTH INSURANCE

## 2018-10-19 ENCOUNTER — Inpatient Hospital Stay: Payer: PRIVATE HEALTH INSURANCE | Attending: Hematology and Oncology | Admitting: Hematology and Oncology

## 2018-10-19 ENCOUNTER — Telehealth: Payer: Self-pay | Admitting: Hematology and Oncology

## 2018-10-19 VITALS — BP 134/83 | HR 85 | Temp 98.1°F | Resp 18 | Ht 66.5 in | Wt 190.8 lb

## 2018-10-19 DIAGNOSIS — D509 Iron deficiency anemia, unspecified: Secondary | ICD-10-CM

## 2018-10-19 DIAGNOSIS — Z9884 Bariatric surgery status: Secondary | ICD-10-CM

## 2018-10-19 DIAGNOSIS — Z9013 Acquired absence of bilateral breasts and nipples: Secondary | ICD-10-CM

## 2018-10-19 DIAGNOSIS — Z139 Encounter for screening, unspecified: Secondary | ICD-10-CM

## 2018-10-19 DIAGNOSIS — Z90710 Acquired absence of both cervix and uterus: Secondary | ICD-10-CM | POA: Diagnosis not present

## 2018-10-19 DIAGNOSIS — Z7982 Long term (current) use of aspirin: Secondary | ICD-10-CM

## 2018-10-19 DIAGNOSIS — C50411 Malignant neoplasm of upper-outer quadrant of right female breast: Secondary | ICD-10-CM

## 2018-10-19 DIAGNOSIS — Z79811 Long term (current) use of aromatase inhibitors: Secondary | ICD-10-CM

## 2018-10-19 DIAGNOSIS — Z170 Estrogen receptor positive status [ER+]: Secondary | ICD-10-CM

## 2018-10-19 DIAGNOSIS — Z90722 Acquired absence of ovaries, bilateral: Secondary | ICD-10-CM | POA: Diagnosis not present

## 2018-10-19 DIAGNOSIS — Z79899 Other long term (current) drug therapy: Secondary | ICD-10-CM

## 2018-10-19 DIAGNOSIS — M8580 Other specified disorders of bone density and structure, unspecified site: Secondary | ICD-10-CM | POA: Diagnosis not present

## 2018-10-19 DIAGNOSIS — D508 Other iron deficiency anemias: Secondary | ICD-10-CM

## 2018-10-19 LAB — CBC WITH DIFFERENTIAL (CANCER CENTER ONLY)
Abs Immature Granulocytes: 0.01 10*3/uL (ref 0.00–0.07)
BASOS ABS: 0 10*3/uL (ref 0.0–0.1)
BASOS PCT: 1 %
EOS ABS: 0.2 10*3/uL (ref 0.0–0.5)
Eosinophils Relative: 5 %
HCT: 38.6 % (ref 36.0–46.0)
Hemoglobin: 11.9 g/dL — ABNORMAL LOW (ref 12.0–15.0)
IMMATURE GRANULOCYTES: 0 %
LYMPHS ABS: 0.8 10*3/uL (ref 0.7–4.0)
Lymphocytes Relative: 19 %
MCH: 26 pg (ref 26.0–34.0)
MCHC: 30.8 g/dL (ref 30.0–36.0)
MCV: 84.5 fL (ref 80.0–100.0)
Monocytes Absolute: 0.5 10*3/uL (ref 0.1–1.0)
Monocytes Relative: 12 %
NEUTROS PCT: 63 %
NRBC: 0 % (ref 0.0–0.2)
Neutro Abs: 2.5 10*3/uL (ref 1.7–7.7)
PLATELETS: 147 10*3/uL — AB (ref 150–400)
RBC: 4.57 MIL/uL (ref 3.87–5.11)
RDW: 15 % (ref 11.5–15.5)
WBC Count: 4 10*3/uL (ref 4.0–10.5)

## 2018-10-19 LAB — CMP (CANCER CENTER ONLY)
ALBUMIN: 3.7 g/dL (ref 3.5–5.0)
ALT: 24 U/L (ref 0–44)
AST: 26 U/L (ref 15–41)
Alkaline Phosphatase: 106 U/L (ref 38–126)
Anion gap: 9 (ref 5–15)
BUN: 10 mg/dL (ref 8–23)
CHLORIDE: 108 mmol/L (ref 98–111)
CO2: 25 mmol/L (ref 22–32)
Calcium: 9.2 mg/dL (ref 8.9–10.3)
Creatinine: 0.67 mg/dL (ref 0.44–1.00)
GFR, Est AFR Am: >60 mL/min (ref >60)
GFR, Estimated: >60 mL/min (ref >60)
GLUCOSE: 105 mg/dL — AB (ref 70–99)
POTASSIUM: 4.5 mmol/L (ref 3.5–5.1)
SODIUM: 142 mmol/L (ref 135–145)
Total Bilirubin: 0.4 mg/dL (ref 0.3–1.2)
Total Protein: 6.6 g/dL (ref 6.5–8.1)

## 2018-10-19 LAB — IRON AND TIBC
Iron: 47 ug/dL (ref 41–142)
SATURATION RATIOS: 10 % — AB (ref 21–57)
TIBC: 451 ug/dL — ABNORMAL HIGH (ref 236–444)
UIBC: 404 ug/dL

## 2018-10-19 LAB — LIPID PANEL
CHOL/HDL RATIO: 2.5 ratio
CHOLESTEROL: 133 mg/dL (ref 0–200)
HDL: 54 mg/dL (ref >40)
LDL Cholesterol: 71 mg/dL (ref 0–99)
Triglycerides: 41 mg/dL (ref <150)
VLDL: 8 mg/dL (ref 0–40)

## 2018-10-19 LAB — FERRITIN: Ferritin: 8 ng/mL — ABNORMAL LOW (ref 11–307)

## 2018-10-19 LAB — VITAMIN B12: VITAMIN B 12: 665 pg/mL (ref 180–914)

## 2018-10-19 NOTE — Assessment & Plan Note (Signed)
History of 2 primary right breast cancers: on observation since completing 7 years [facility] 10-2014, clinically doing well.   Osteopenia preceeding aromatase inhibitor: Risk factors include aromatase inhibitor, gastric bypass. Encouraged weight bearing exercise.  Hysterectomy with oophorectomy 2008 prophylactic left mastectomy, bilateral breast reconstructions Iron deficiency related to gastric bypass  Return to clinic in 1 year for surveillance checkup and follow-up.

## 2018-10-19 NOTE — Telephone Encounter (Signed)
Per 10/23 patient decline avs and calendar

## 2018-10-19 NOTE — Assessment & Plan Note (Signed)
Iron deficiency related to prior gastric bypass surgery.  We will obtain blood work and iron studies today.

## 2018-10-19 NOTE — Progress Notes (Signed)
Patient Care Team: Denita Lung, MD as PCP - General (Family Medicine)  DIAGNOSIS:  Encounter Diagnoses  Name Primary?  . Malignant neoplasm of upper-outer quadrant of right breast in female, estrogen receptor positive ([place])   . Iron deficiency anemia, unspecified iron deficiency anemia type     SUMMARY OF ONCOLOGIC HISTORY:   Malignant neoplasm of upper-outer quadrant of right breast in female, estrogen receptor positive ([place])   1993 Initial Diagnosis    Breast cancer ([place]): right breast, stage 2, 3/41 nodes involved, ER/PR negative, HER-2 not tested.  , treated on NSABP 525 with Adriamycin, Cytoxan    06/2007 Relapse/Recurrence    Right breast cancer.  Bilateral mastectomies with reconstruction: Right: IDC, 0.6cm, grade 1, 3 SLN negative, T1b, N0, ER+(84%), PR+(38%), Ki-67 14%, HER-2 negative.   Left: negative    06/2007 Surgery    TAH/BSO    06/2007 Genetic Testing    Negative genetic testing    07/2007 - 10/2014 Anti-estrogen oral therapy    Letrozole daily     CHIEF COMPLIANT: Surveillance of breast cancer and iron deficiency anemia  INTERVAL HISTORY: Christy [name] is a 62 year old with above-mentioned history of breast cancer is currently in remission.  She does not have any lumps or nodules in the breast.  She reports hot liquid on the breast that was reconstructed and had to see a plastic surgeon who performed a breast MRI did not detect any abnormalities.  Has healed very well.  She also has chronic iron deficiency anemia from a gastric bypass surgery.  Labs have to be done today.  REVIEW OF SYSTEMS:   Constitutional: Denies fevers, chills or abnormal weight loss Eyes: Denies blurriness of vision Ears, nose, mouth, throat, and face: Denies mucositis or sore throat Respiratory: Denies cough, dyspnea or wheezes Cardiovascular: Denies palpitation, chest discomfort Gastrointestinal:  Denies nausea, heartburn or change in bowel habits Skin: Denies abnormal skin  rashes Lymphatics: Denies new lymphadenopathy or easy bruising Neurological:Denies numbness, tingling or new weaknesses Behavioral/Psych: Mood is stable, no new changes  Extremities: No lower extremity edema Breast:  denies any pain or lumps or nodules in either breasts All other systems were reviewed with the patient and are negative.  I have reviewed the past medical history, past surgical history, social history and family history with the patient and they are unchanged from previous note.  ALLERGIES:  is allergic to morphine and related; levaquin [levofloxacin in d5w]; and tylenol [acetaminophen].  MEDICATIONS:  Current Outpatient Medications  Medication Sig Dispense Refill  . amphetamine-dextroamphetamine (ADDERALL) 15 MG tablet Take 1 tablet by mouth daily. 90 tablet 0  . aspirin EC 81 MG tablet Take 1 tablet (81 mg total) by mouth 2 (two) times daily. 60 tablet 0  . B Complex-C (B-COMPLEX WITH VITAMIN C) tablet Take 1 tablet by mouth daily.    . celecoxib (CELEBREX) 200 MG capsule Take 400 mg by mouth daily.    . Cholecalciferol (VITAMIN D3) 5000 units CAPS Take 5,000 Units by mouth daily.     . clobetasol cream (TEMOVATE) 4.09 % Apply 1 application topically 2 (two) times daily. 45 g 2  . cyclobenzaprine (FLEXERIL) 10 MG tablet Take 10 mg by mouth 3 (three) times daily as needed for muscle spasms.    . cycloSPORINE (RESTASIS) 0.05 % ophthalmic emulsion Place 2 drops into both eyes daily.    . fexofenadine (ALLEGRA) 180 MG tablet Take 180 mg by mouth daily as needed for allergies or rhinitis.    Marland Kitchen  levothyroxine (SYNTHROID, LEVOTHROID) 50 MCG tablet TAKE ONE TABLET BY MOUTH ONCE DAILY (Patient taking differently: Take 50 mcg by mouth daily before breakfast. TAKE ONE TABLET BY MOUTH ONCE DAIL) 90 tablet 3  . MAGNESIUM OXIDE PO Take 1 tablet by mouth daily.    . methocarbamol (ROBAXIN) 500 MG tablet Take 1 tablet (500 mg total) by mouth 2 (two) times daily with a meal. 60 tablet 0  .  Multiple Vitamin (MULTIVITAMIN) capsule Take 3 capsules by mouth daily.     Marland Kitchen oxyCODONE (OXY IR/ROXICODONE) 5 MG immediate release tablet Take 1 tablet (5 mg total) by mouth every 4 (four) hours as needed for severe pain. (Patient not taking: Reported on 08/05/2018) 30 tablet 0  . ranitidine (ZANTAC) 300 MG tablet TAKE 1 TABLET BY MOUTH AT BEDTIME. (Patient taking differently: Take 300 mg by mouth at bedtime as needed acid reflux) 30 tablet 1  . triamcinolone cream (KENALOG) 0.5 % Apply 1 application topically 3 (three) times daily. 30 g 5  . valACYclovir (VALTREX) 500 MG tablet TAKE 2 TABLETS BY MOUTH DAILY AS NEEDED. *NEEDS OFFICE VISIT FOR FURTHER REFILLS* (Patient taking differently: TAKE 2 TABLETS BY MOUTH DAILY AS NEEDED FOR FEVER BLISTERS . *NEEDS OFFICE VISIT FOR FURTHER REFILLS*) 30 tablet 0  . venlafaxine XR (EFFEXOR-XR) 150 MG 24 hr capsule TAKE 1 CAPSULE BY MOUTH EVERY DAY WITH BREAKFAST 90 capsule 3  . VYVANSE 70 MG capsule TAKE 1 CAPSULE BY MOUTH EVERY DAY 90 capsule 0   No current facility-administered medications for this visit.     PHYSICAL EXAMINATION: ECOG PERFORMANCE STATUS: 1 - Symptomatic but completely ambulatory  Vitals:   10/19/18 0832  BP: 134/83  Pulse: 85  Resp: 18  Temp: 98.1 F (36.7 C)  SpO2: 100%   Filed Weights   10/19/18 0832  Weight: 190 lb 12.8 oz (86.5 kg)    GENERAL:alert, no distress and comfortable SKIN: skin color, texture, turgor are normal, no rashes or significant lesions EYES: normal, Conjunctiva are pink and non-injected, sclera clear OROPHARYNX:no exudate, no erythema and lips, buccal mucosa, and tongue normal  NECK: supple, thyroid normal size, non-tender, without nodularity LYMPH:  no palpable lymphadenopathy in the cervical, axillary or inguinal LUNGS: clear to auscultation and percussion with normal breathing effort HEART: regular rate & rhythm and no murmurs and no lower extremity edema ABDOMEN:abdomen soft, non-tender and normal  bowel sounds MUSCULOSKELETAL:no cyanosis of digits and no clubbing  NEURO: alert & oriented x 3 with fluent speech, no focal motor/sensory deficits EXTREMITIES: No lower extremity edema BREAST: Bilateral reconstructed breast no palpable lumps or nodules of concern. (exam performed in the presence of a chaperone)  LABORATORY DATA:  I have reviewed the data as listed CMP Latest Ref Rng & Units 06/30/2018 06/29/2018 06/01/2018  Glucose 70 - 99 mg/dL 120(H) 89 103(H)  BUN 8 - 23 mg/dL 7(L) 12 17  Creatinine 0.44 - 1.00 mg/dL 0.73 0.65 0.62  Sodium 135 - 145 mmol/L 143 143 140  Potassium 3.5 - 5.1 mmol/L 4.5 4.0 4.1  Chloride 98 - 111 mmol/L 108 107 105  CO2 22 - 32 mmol/L _0 Calcium 8.9 - 10.3 mg/dL 8.7(L) 9.0 9.2  Total Protein 6.4 - 8.3 g/dL - - -  Total Bilirubin 0.20 - 1.20 mg/dL - - -  Alkaline Phos 40 - 150 U/L - - -  AST 5 - 34 U/L - - -  ALT 0 - 55 U/L - - -    Lab  Results  Component Value Date   WBC 6.2 06/30/2018   HGB 11.0 (L) 06/30/2018   HCT 35.0 (L) 06/30/2018   MCV 90.2 06/30/2018   PLT 126 (L) 06/30/2018   NEUTROABS 2.2 06/29/2018    ASSESSMENT & PLAN:  Malignant neoplasm of upper-outer quadrant of right breast in female, estrogen receptor positive ([place]) History of 2 primary right breast cancers: on observation since completing 7 years [facility] 10-2014, clinically doing well.   Osteopenia preceeding aromatase inhibitor: Risk factors include aromatase inhibitor, gastric bypass. Encouraged weight bearing exercise. She will need another bone density test.  Surveillance: Breast exam without any abnormalities bilateral reconstructed breasts. Hysterectomy with oophorectomy 2008 prophylactic left mastectomy, bilateral breast reconstructions Iron deficiency related to gastric bypass   Iron deficiency anemia Iron deficiency related to prior gastric bypass surgery.  We will obtain blood work and iron studies today. We will also check her vitamin D levels and B12 as  well as fasting lipid panel along with CBC and CMP. I will call her with results of these tests. Return to clinic in 1 year for follow-up   No orders of the defined types were placed in this encounter.  The patient has a good understanding of the overall plan. she agrees with it. she will call with any problems that may develop before the next visit here.   Harriette Ohara, MD 10/19/18

## 2018-10-20 ENCOUNTER — Encounter: Payer: Self-pay | Admitting: Family Medicine

## 2018-10-20 LAB — VITAMIN D 25 HYDROXY (VIT D DEFICIENCY, FRACTURES): VIT D 25 HYDROXY: 41.1 ng/mL (ref 30.0–100.0)

## 2018-10-24 ENCOUNTER — Encounter: Payer: Self-pay | Admitting: Gastroenterology

## 2018-10-24 ENCOUNTER — Telehealth: Payer: Self-pay | Admitting: Hematology and Oncology

## 2018-10-24 NOTE — Telephone Encounter (Signed)
Give her the information that she needs.

## 2018-10-24 NOTE — Telephone Encounter (Signed)
Called patient per 10/25 msg to reschedule

## 2018-11-04 ENCOUNTER — Other Ambulatory Visit: Payer: Self-pay | Admitting: Family Medicine

## 2018-11-04 DIAGNOSIS — F900 Attention-deficit hyperactivity disorder, predominantly inattentive type: Secondary | ICD-10-CM

## 2018-11-07 MED ORDER — AMPHETAMINE-DEXTROAMPHETAMINE 15 MG PO TABS: 15.0000 mg | ORAL_TABLET | Freq: Every day | ORAL | 0 refills | Status: DC

## 2018-11-07 NOTE — Telephone Encounter (Signed)
Is this okay to refill? 

## 2018-11-10 ENCOUNTER — Other Ambulatory Visit: Payer: Self-pay | Admitting: Family Medicine

## 2018-11-10 DIAGNOSIS — E039 Hypothyroidism, unspecified: Secondary | ICD-10-CM

## 2018-11-16 ENCOUNTER — Encounter: Payer: PRIVATE HEALTH INSURANCE | Admitting: Genetic Counselor

## 2018-11-16 ENCOUNTER — Other Ambulatory Visit: Payer: PRIVATE HEALTH INSURANCE

## 2018-11-23 ENCOUNTER — Encounter: Payer: PRIVATE HEALTH INSURANCE | Admitting: Genetic Counselor

## 2018-11-23 ENCOUNTER — Other Ambulatory Visit: Payer: PRIVATE HEALTH INSURANCE

## 2018-11-30 ENCOUNTER — Inpatient Hospital Stay: Payer: PRIVATE HEALTH INSURANCE | Admitting: Genetic Counselor

## 2018-11-30 ENCOUNTER — Inpatient Hospital Stay: Payer: PRIVATE HEALTH INSURANCE

## 2019-03-10 ENCOUNTER — Other Ambulatory Visit: Payer: Self-pay | Admitting: Family Medicine

## 2019-03-10 DIAGNOSIS — F900 Attention-deficit hyperactivity disorder, predominantly inattentive type: Secondary | ICD-10-CM

## 2019-03-10 NOTE — Telephone Encounter (Signed)
Is this okay to refill? 

## 2019-03-16 ENCOUNTER — Other Ambulatory Visit: Payer: Self-pay | Admitting: Family Medicine

## 2019-03-16 DIAGNOSIS — F900 Attention-deficit hyperactivity disorder, predominantly inattentive type: Secondary | ICD-10-CM

## 2019-03-16 NOTE — Telephone Encounter (Signed)
This was called in on the 13th

## 2019-03-16 NOTE — Telephone Encounter (Signed)
Is this okay to refill? 

## 2019-03-17 ENCOUNTER — Other Ambulatory Visit: Payer: Self-pay | Admitting: Family Medicine

## 2019-03-17 NOTE — Telephone Encounter (Signed)
Called pharmacy and was advised that adderall was fill on the 13th vyvase is was last filled in dec of 2019. Please advise [facility]

## 2019-04-21 ENCOUNTER — Other Ambulatory Visit: Payer: Self-pay | Admitting: Family Medicine

## 2019-04-21 DIAGNOSIS — F341 Dysthymic disorder: Secondary | ICD-10-CM

## 2019-04-21 DIAGNOSIS — T451X5A Adverse effect of antineoplastic and immunosuppressive drugs, initial encounter: Secondary | ICD-10-CM

## 2019-04-21 DIAGNOSIS — R232 Flushing: Secondary | ICD-10-CM

## 2019-04-21 NOTE — Telephone Encounter (Signed)
Is this okay to refill? 

## 2019-07-03 ENCOUNTER — Other Ambulatory Visit: Payer: Self-pay | Admitting: Family Medicine

## 2019-07-03 DIAGNOSIS — F900 Attention-deficit hyperactivity disorder, predominantly inattentive type: Secondary | ICD-10-CM

## 2019-07-03 NOTE — Telephone Encounter (Signed)
Is this okay to refill? 

## 2019-07-18 ENCOUNTER — Other Ambulatory Visit: Payer: Self-pay | Admitting: Family Medicine

## 2019-07-18 ENCOUNTER — Encounter: Payer: Self-pay | Admitting: Family Medicine

## 2019-07-18 DIAGNOSIS — F900 Attention-deficit hyperactivity disorder, predominantly inattentive type: Secondary | ICD-10-CM

## 2019-07-18 DIAGNOSIS — B001 Herpesviral vesicular dermatitis: Secondary | ICD-10-CM

## 2019-07-18 MED ORDER — VALACYCLOVIR HCL 500 MG PO TABS: 500.0000 mg | ORAL_TABLET | Freq: Every day | ORAL | 0 refills | Status: DC

## 2019-07-18 MED ORDER — AMPHETAMINE-DEXTROAMPHETAMINE 15 MG PO TABS: 15.0000 mg | ORAL_TABLET | Freq: Every day | ORAL | 0 refills | Status: DC

## 2019-07-18 NOTE — Telephone Encounter (Signed)
[facility] is requesting to fill pt valtrex and adderall. Please advise [facility]

## 2019-09-12 ENCOUNTER — Encounter: Payer: Self-pay | Admitting: Gastroenterology

## 2019-09-14 DIAGNOSIS — H9313 Tinnitus, bilateral: Secondary | ICD-10-CM | POA: Insufficient documentation

## 2019-09-14 DIAGNOSIS — H9190 Unspecified hearing loss, unspecified ear: Secondary | ICD-10-CM | POA: Insufficient documentation

## 2019-09-27 ENCOUNTER — Encounter: Payer: Self-pay | Admitting: Gastroenterology

## 2019-09-27 ENCOUNTER — Other Ambulatory Visit: Payer: Self-pay

## 2019-09-27 ENCOUNTER — Ambulatory Visit (AMBULATORY_SURGERY_CENTER): Payer: PRIVATE HEALTH INSURANCE | Admitting: *Deleted

## 2019-09-27 VITALS — Temp 97.5°F | Ht 66.5 in | Wt 202.0 lb

## 2019-09-27 DIAGNOSIS — Z86010 Personal history of colonic polyps: Secondary | ICD-10-CM

## 2019-09-27 MED ORDER — NA SULFATE-K SULFATE-MG SULF 17.5-3.13-1.6 GM/177ML PO SOLN: ORAL | 0 refills | Status: DC

## 2019-09-27 NOTE — Progress Notes (Signed)
Patient is here in-person for PV. Patient denies any allergies to eggs or soy. Patient denies any problems with sedation. Patient had a hard time waking up after gastric bypass in 2004. Patient denies any oxygen use at home. Patient denies taking any diet/weight loss medications or blood thinners. Patient is not being treated for MRSA or C-diff. EMMI education assisgned to patient on colonoscopy, this was explained and instructions given to patient. Suprep 15 off coupon given to pt.   Pt is aware that care partner will wait in the car during procedure; if they feel like they will be too hot to wait in the car; they may wait in the lobby. Patient is aware to bring only one care partner. We want them to wear a mask (we do not have any that we can provide them), practice social distancing, and we will check their temperatures when they get here.  I did remind patient that their care partner needs to stay in the parking lot the entire time. Pt will wear mask into building.

## 2019-10-03 ENCOUNTER — Telehealth: Payer: Self-pay

## 2019-10-03 NOTE — Telephone Encounter (Signed)
Covid-19 screening questions   Do you now or have you had a fever in the last 14 days? NO   Do you have any respiratory symptoms of shortness of breath or cough now or in the last 14 days? NO  Do you have any family members or close contacts with diagnosed or suspected Covid-19 in the past 14 days? NO  Have you been tested for Covid-19 and found to be positive? NO        

## 2019-10-04 ENCOUNTER — Encounter: Payer: PRIVATE HEALTH INSURANCE | Admitting: Gastroenterology

## 2019-10-04 ENCOUNTER — Other Ambulatory Visit: Payer: Self-pay

## 2019-10-04 ENCOUNTER — Encounter: Payer: Self-pay | Admitting: Gastroenterology

## 2019-10-04 ENCOUNTER — Ambulatory Visit (AMBULATORY_SURGERY_CENTER): Payer: PRIVATE HEALTH INSURANCE | Admitting: Gastroenterology

## 2019-10-04 VITALS — BP 164/69 | HR 83 | Resp 35 | Ht 66.0 in | Wt 202.0 lb

## 2019-10-04 DIAGNOSIS — D123 Benign neoplasm of transverse colon: Secondary | ICD-10-CM

## 2019-10-04 DIAGNOSIS — Z860100 Personal history of colon polyps, unspecified: Secondary | ICD-10-CM

## 2019-10-04 DIAGNOSIS — D125 Benign neoplasm of sigmoid colon: Secondary | ICD-10-CM | POA: Diagnosis not present

## 2019-10-04 MED ORDER — SODIUM CHLORIDE 0.9 % IV SOLN: 500.0000 mL | Freq: Once | INTRAVENOUS | Status: DC

## 2019-10-04 NOTE — Patient Instructions (Signed)
YOU HAD AN ENDOSCOPIC PROCEDURE TODAY AT [facility]:   Refer to the procedure report that was given to you for any specific questions about what was found during the examination.  If the procedure report does not answer your questions, please call your gastroenterologist to clarify.  If you requested that your care partner not be given the details of your procedure findings, then the procedure report has been included in a sealed envelope for you to review at your convenience later.  **Handouts given on polyps, hemorrhoids and diverticulosis**   YOU SHOULD EXPECT: Some feelings of bloating in the abdomen. Passage of more gas than usual.  Walking can help get rid of the air that was put into your GI tract during the procedure and reduce the bloating. If you had a lower endoscopy (such as a colonoscopy or flexible sigmoidoscopy) you may notice spotting of blood in your stool or on the toilet paper. If you underwent a bowel prep for your procedure, you may not have a normal bowel movement for a few days.  Please Note:  You might notice some irritation and congestion in your nose or some drainage.  This is from the oxygen used during your procedure.  There is no need for concern and it should clear up in a day or so.  SYMPTOMS TO REPORT IMMEDIATELY:   Following lower endoscopy (colonoscopy or flexible sigmoidoscopy):  Excessive amounts of blood in the stool  Significant tenderness or worsening of abdominal pains  Swelling of the abdomen that is new, acute  Fever of 100F or higher    For urgent or emergent issues, a gastroenterologist can be reached at any hour by calling [phone].   DIET:  We do recommend a small meal at first, but then you may proceed to your regular diet.  Drink plenty of fluids but you should avoid alcoholic beverages for 24 hours.  ACTIVITY:  You should plan to take it easy for the rest of today and you should NOT DRIVE or use heavy machinery until  tomorrow (because of the sedation medicines used during the test).    FOLLOW UP: Our staff will call the number listed on your records 48-72 hours following your procedure to check on you and address any questions or concerns that you may have regarding the information given to you following your procedure. If we do not reach you, we will leave a message.  We will attempt to reach you two times.  During this call, we will ask if you have developed any symptoms of COVID 19. If you develop any symptoms (ie: fever, flu-like symptoms, shortness of breath, cough etc.) before then, please call [phone].  If you test positive for Covid 19 in the 2 weeks post procedure, please call and report this information to [place].    If any biopsies were taken you will be contacted by phone or by letter within the next 1-3 weeks.  Please call us at [phone] if you have not heard about the biopsies in 3 weeks.    SIGNATURES/CONFIDENTIALITY: You and/or your care partner have signed paperwork which will be entered into your electronic medical record.  These signatures attest to the fact that that the information above on your After Visit Summary has been reviewed and is understood.  Full responsibility of the confidentiality of this discharge information lies with you and/or your care-partner.

## 2019-10-04 NOTE — Op Note (Signed)
[facility] Patient Name: Christy [name] Procedure Date: 10/04/2019 11:47 AM MRN: [id number] Endoscopist: Mauri Pole , MD Age: 63 Referring MD:  Date of Birth: April 14, 1956 Gender: Female Account #: [id number] Procedure:                Colonoscopy Indications:              High risk colon cancer surveillance: Personal                            history of colonic polyps, High risk colon cancer                            surveillance: Personal history of multiple (3 or                            more) adenomas Medicines:                Monitored Anesthesia Care Procedure:                Pre-Anesthesia Assessment:                           - Prior to the procedure, a History and Physical                            was performed, and patient medications and                            allergies were reviewed. The patient's tolerance of                            previous anesthesia was also reviewed. The risks                            and benefits of the procedure and the sedation                            options and risks were discussed with the patient.                            All questions were answered, and informed consent                            was obtained. Prior Anticoagulants: The patient has                            taken no previous anticoagulant or antiplatelet                            agents. ASA Grade Assessment: II - A patient with                            mild systemic disease. After reviewing the risks  and benefits, the patient was deemed in                            satisfactory condition to undergo the procedure.                           After obtaining informed consent, the colonoscope                            was passed under direct vision. Throughout the                            procedure, the patient's blood pressure, pulse, and                            oxygen saturations were monitored  continuously. The                            Colonoscope was introduced through the anus and                            advanced to the the cecum, identified by                            appendiceal orifice and ileocecal valve. The                            colonoscopy was performed without difficulty. The                            patient tolerated the procedure well. The quality                            of the bowel preparation was good. The ileocecal                            valve, appendiceal orifice, and rectum were                            photographed. Scope In: 11:53:23 AM Scope Out: 12:10:45 PM Scope Withdrawal Time: 0 hours 13 minutes 46 seconds  Total Procedure Duration: 0 hours 17 minutes 22 seconds  Findings:                 The perianal and digital rectal examinations were                            normal.                           Three flat polyps were found in the sigmoid colon                            and transverse colon. The polyps were 4 to 8 mm in  size. These polyps were removed with a cold snare.                            Resection and retrieval were complete.                           Scattered small-mouthed diverticula were found in                            the sigmoid colon.                           Non-bleeding internal hemorrhoids were found during                            retroflexion. The hemorrhoids were small.                           There was a small lipoma, in the ascending colon.                           The exam was otherwise without abnormality. Complications:            No immediate complications. Estimated Blood Loss:     Estimated blood loss was minimal. Impression:               - Three 4 to 8 mm polyps in the sigmoid colon and                            in the transverse colon, removed with a cold snare.                            Resected and retrieved.                           - Diverticulosis  in the sigmoid colon.                           - Non-bleeding internal hemorrhoids.                           - Small lipoma in the ascending colon.                           - The examination was otherwise normal. Recommendation:           - Patient has a contact number available for                            emergencies. The signs and symptoms of potential                            delayed complications were discussed with the                            patient. Return to normal activities  tomorrow.                            Written discharge instructions were provided to the                            patient.                           - Resume previous diet.                           - Continue present medications.                           - Await pathology results.                           - Repeat colonoscopy in 3 - 5 years for                            surveillance based on pathology results. Mauri Pole, MD 10/04/2019 12:14:53 PM This report has been signed electronically.

## 2019-10-04 NOTE — Progress Notes (Signed)
PT taken to PACU. Monitors in place. VSS. Report given to RN. 

## 2019-10-04 NOTE — Progress Notes (Signed)
Called to room to assist during endoscopic procedure.  Patient ID and intended procedure confirmed with present staff. Received instructions for my participation in the procedure from the performing physician.  

## 2019-10-04 NOTE — Progress Notes (Signed)
Temp per Marianna Fuss  VS per Loma Sousa

## 2019-10-06 ENCOUNTER — Telehealth: Payer: Self-pay | Admitting: *Deleted

## 2019-10-06 ENCOUNTER — Telehealth: Payer: Self-pay

## 2019-10-06 NOTE — Telephone Encounter (Signed)
Attempted to reach pt. With follow-up call following endoscopic procedure 107/2020.  LM on pt. Voice mail to call if she has any questions or concerns, or has come down with any symptoms of Covid 19.

## 2019-10-06 NOTE — Telephone Encounter (Signed)
First attempt, left VM.  

## 2019-10-12 ENCOUNTER — Encounter: Payer: Self-pay | Admitting: Gastroenterology

## 2019-10-13 ENCOUNTER — Other Ambulatory Visit: Payer: Self-pay

## 2019-10-13 ENCOUNTER — Ambulatory Visit: Payer: PRIVATE HEALTH INSURANCE | Admitting: Family Medicine

## 2019-10-13 ENCOUNTER — Encounter: Payer: Self-pay | Admitting: Family Medicine

## 2019-10-13 VITALS — BP 170/100 | HR 80 | Temp 96.8°F | Wt 202.8 lb

## 2019-10-13 DIAGNOSIS — R42 Dizziness and giddiness: Secondary | ICD-10-CM

## 2019-10-13 MED ORDER — MECLIZINE HCL 12.5 MG PO TABS: 12.5000 mg | ORAL_TABLET | Freq: Three times a day (TID) | ORAL | 0 refills | Status: DC | PRN

## 2019-10-13 NOTE — Progress Notes (Signed)
   Subjective:    Patient ID: Christy [name], female    DOB: 1956/11/02, 63 y.o.   MRN: [id number]  HPI She has had 2 episodes of dizziness recently.  The first 1 lasted approximately 3 hours a second 1 lasted an hour and a half.  She had some associated nausea but no blurred vision, double vision, weakness, numbness, tingling, heart rate changes.  Cannot relate this to position.  She is concerned about her blood pressure.   Review of Systems     Objective:   Physical Exam Alert and in no distress.  EOMI.  Other cranial nerves grossly intact.  DTRs normal.  Normal cerebellar.  Tympanic membranes and canals are normal. Pharyngeal area is normal. Neck is supple without adenopathy or thyromegaly. Cardiac exam shows a regular sinus rhythm without murmurs or gallops. Lungs are clear to auscultation.        Assessment & Plan:  Vertigo - Plan: meclizine (ANTIVERT) 12.5 MG tablet I explained that I thought this was benign in nature and probably inner ear.  I will give her Antivert to use.  If she notes that her symptoms change, we can always reevaluate.  She was comfortable with that.

## 2019-10-16 ENCOUNTER — Ambulatory Visit: Payer: PRIVATE HEALTH INSURANCE | Admitting: Family Medicine

## 2019-10-19 ENCOUNTER — Other Ambulatory Visit: Payer: Self-pay | Admitting: Family Medicine

## 2019-10-19 DIAGNOSIS — F900 Attention-deficit hyperactivity disorder, predominantly inattentive type: Secondary | ICD-10-CM

## 2019-10-19 NOTE — Telephone Encounter (Signed)
Is this okay to refill? 

## 2019-10-19 NOTE — Telephone Encounter (Signed)
[facility]se. Please advise [facility] °

## 2019-10-24 ENCOUNTER — Other Ambulatory Visit: Payer: Self-pay

## 2019-10-24 DIAGNOSIS — D509 Iron deficiency anemia, unspecified: Secondary | ICD-10-CM

## 2019-10-24 NOTE — Progress Notes (Signed)
Patient Care Team: Denita Lung, MD as PCP - General (Family Medicine)  DIAGNOSIS:    ICD-10-CM   1. Encounter for health check  Z00.00 Lipid panel    DG Bone Density  2. Malignant neoplasm of upper-outer quadrant of right breast in female, estrogen receptor positive ([place])  C50.411    Z17.0   3. Iron deficiency anemia, unspecified iron deficiency anemia type  D50.9     SUMMARY OF ONCOLOGIC HISTORY: Oncology History  Malignant neoplasm of upper-outer quadrant of right breast in female, estrogen receptor positive ([place])  1993 Initial Diagnosis   Breast cancer ([place]): right breast, stage 2, 3/41 nodes involved, ER/PR negative, HER-2 not tested.  , treated on NSABP 525 with Adriamycin, Cytoxan   06/2007 Relapse/Recurrence   Right breast cancer.  Bilateral mastectomies with reconstruction: Right: IDC, 0.6cm, grade 1, 3 SLN negative, T1b, N0, ER+(84%), PR+(38%), Ki-67 14%, HER-2 negative.   Left: negative   06/2007 Surgery   TAH/BSO   06/2007 Genetic Testing   Negative genetic testing   07/2007 - 10/2014 Anti-estrogen oral therapy   Letrozole daily     CHIEF COMPLIANT: Surveillance of breast cancer and iron deficiency anemia  INTERVAL HISTORY: Christy [name] is a 63 y.o. with above-mentioned history of recurrent right breast cancer currently on surveillance. She also has a history of iron deficiency anemia s/p gastric bypass surgery. I last saw her a year ago. She presents to the clinic today for annual follow-up.  She had some major psychological issues separating from her husband as well as and a bill to see her grandchild because of Covid in the community.  She has gained weight    REVIEW OF SYSTEMS:   Constitutional: Denies fevers, chills or abnormal weight loss Eyes: Denies blurriness of vision Ears, nose, mouth, throat, and face: Denies mucositis or sore throat Respiratory: Denies cough, dyspnea or wheezes Cardiovascular: Denies palpitation, chest discomfort  Gastrointestinal: Denies nausea, heartburn or change in bowel habits Skin: Denies abnormal skin rashes Lymphatics: Denies new lymphadenopathy or easy bruising Neurological: Denies numbness, tingling or new weaknesses Behavioral/Psych: Mood is stable, no new changes  Extremities: No lower extremity edema Breast: denies any pain or lumps or nodules in either breasts All other systems were reviewed with the patient and are negative.  I have reviewed the past medical history, past surgical history, social history and family history with the patient and they are unchanged from previous note.  ALLERGIES:  is allergic to morphine and related; levaquin [levofloxacin in d5w]; and tylenol [acetaminophen].  MEDICATIONS:  Current Outpatient Medications  Medication Sig Dispense Refill  . amphetamine-dextroamphetamine (ADDERALL) 15 MG tablet Take 1 tablet by mouth daily. 90 tablet 0  . aspirin EC 81 MG tablet Take 1 tablet (81 mg total) by mouth 2 (two) times daily. 60 tablet 0  . B Complex-C (B-COMPLEX WITH VITAMIN C) tablet Take 1 tablet by mouth daily.    . celecoxib (CELEBREX) 200 MG capsule Take 400 mg by mouth daily.    . cetirizine (ZYRTEC) 10 MG tablet     . Cholecalciferol (VITAMIN D3) 5000 units CAPS Take 5,000 Units by mouth daily.     . clobetasol cream (TEMOVATE) 3.11 % Apply 1 application topically 2 (two) times daily. (Patient not taking: Reported on 09/27/2019) 45 g 2  . cyclobenzaprine (FLEXERIL) 10 MG tablet Take 10 mg by mouth 3 (three) times daily as needed for muscle spasms.    . cycloSPORINE (RESTASIS) 0.05 % ophthalmic emulsion Place 2  drops into both eyes daily.    . fexofenadine (ALLEGRA) 180 MG tablet Take 180 mg by mouth daily as needed for allergies or rhinitis.    Marland Kitchen ibuprofen (ADVIL) 600 MG tablet     . levothyroxine (SYNTHROID, LEVOTHROID) 50 MCG tablet TAKE ONE TABLET BY MOUTH ONCE DAILY 90 tablet 3  . MAGNESIUM OXIDE PO Take 1 tablet by mouth daily.    . meclizine  (ANTIVERT) 12.5 MG tablet Take 1 tablet (12.5 mg total) by mouth 3 (three) times daily as needed for dizziness. 20 tablet 0  . Multiple Vitamin (MULTIVITAMIN) capsule Take 3 capsules by mouth daily.     Marland Kitchen triamcinolone cream (KENALOG) 0.5 % Apply 1 application topically 3 (three) times daily. 30 g 5  . valACYclovir (VALTREX) 500 MG tablet Take 1 tablet (500 mg total) by mouth daily. 30 tablet 0  . venlafaxine XR (EFFEXOR-XR) 150 MG 24 hr capsule TAKE 1 CAPSULE BY MOUTH EVERY DAY WITH BREAKFAST 90 capsule 3  . VYVANSE 70 MG capsule TAKE 1 CAPSULE BY MOUTH EVERY DAY. 90 capsule 0   No current facility-administered medications for this visit.     PHYSICAL EXAMINATION: ECOG PERFORMANCE STATUS: 1 - Symptomatic but completely ambulatory  Vitals:   10/25/19 0928  BP: (!) 145/80  Pulse: 87  Resp: 18  Temp: 98.5 F (36.9 C)  SpO2: 100%   Filed Weights   10/25/19 0928  Weight: 202 lb 6.4 oz (91.8 kg)    GENERAL: alert, no distress and comfortable SKIN: skin color, texture, turgor are normal, no rashes or significant lesions EYES: normal, Conjunctiva are pink and non-injected, sclera clear OROPHARYNX: no exudate, no erythema and lips, buccal mucosa, and tongue normal  NECK: supple, thyroid normal size, non-tender, without nodularity LYMPH: no palpable lymphadenopathy in the cervical, axillary or inguinal LUNGS: clear to auscultation and percussion with normal breathing effort HEART: regular rate & rhythm and no murmurs and no lower extremity edema ABDOMEN: abdomen soft, non-tender and normal bowel sounds MUSCULOSKELETAL: no cyanosis of digits and no clubbing  NEURO: alert & oriented x 3 with fluent speech, no focal motor/sensory deficits EXTREMITIES: No lower extremity edema BREAST: No palpable masses or nodules in either right or left breasts. No palpable axillary supraclavicular or infraclavicular adenopathy no breast tenderness or nipple discharge. (exam performed in the presence of a  chaperone)  LABORATORY DATA:  I have reviewed the data as listed CMP Latest Ref Rng & Units 10/25/2019 10/19/2018 06/30/2018  Glucose 70 - 99 mg/dL 111(H) 105(H) 120(H)  BUN 8 - 23 mg/dL 12 10 7(L)  Creatinine 0.44 - 1.00 mg/dL 0.70 0.67 0.73  Sodium 135 - 145 mmol/L 140 142 143  Potassium 3.5 - 5.1 mmol/L 4.5 4.5 4.5  Chloride 98 - 111 mmol/L 105 108 108  CO2 22 - 32 mmol/L _0 Calcium 8.9 - 10.3 mg/dL 9.0 9.2 8.7(L)  Total Protein 6.5 - 8.1 g/dL 6.9 6.6 -  Total Bilirubin 0.3 - 1.2 mg/dL 0.4 0.4 -  Alkaline Phos 38 - 126 U/L 125 106 -  AST 15 - 41 U/L 25 26 -  ALT 0 - 44 U/L 23 24 -    Lab Results  Component Value Date   WBC 4.9 10/25/2019   HGB 13.0 10/25/2019   HCT 41.1 10/25/2019   MCV 86.0 10/25/2019   PLT 168 10/25/2019   NEUTROABS 2.7 10/25/2019    ASSESSMENT & PLAN:  Malignant neoplasm of upper-outer quadrant of right breast in female,  estrogen receptor positive ([place]) History of 2 primary right breast cancers: on observation since completed 7 years [facility] 10-2014, clinically doing well.   Osteopenia . She will need another bone density test.  Surveillance: Breast exam without any abnormalities bilateral reconstructed breasts. Hysterectomy with oophorectomy 2008 prophylactic left mastectomy, bilateral breast reconstructions  Iron deficiency anemia Iron deficiency related to prior gastric bypass surgery 10/19/2018: Iron saturation 10%, hemoglobin 11.9, MCV 84.5, ferritin 8 Prior treatment: IV iron: 2014 and 2016   Orders Placed This Encounter  Procedures  . DG Bone Density    Standing Status:   Future    Standing Expiration Date:   10/24/2020    Scheduling Instructions:     Jan 13th afternoon    Order Specific Question:   Reason for Exam (SYMPTOM  OR DIAGNOSIS REQUIRED)    Answer:   Post menopausal    Order Specific Question:   Preferred imaging location?    Answer:   [facility]  . Lipid panel    Standing Status:   Future    Number of  Occurrences:   1    Standing Expiration Date:   10/24/2020   The patient has a good understanding of the overall plan. she agrees with it. she will call with any problems that may develop before the next visit here.  Nicholas Lose, MD 10/25/2019  Julious Oka Dorshimer am acting as scribe for Dr. Nicholas Lose.  I have reviewed the above documentation for accuracy and completeness, and I agree with the above.

## 2019-10-25 ENCOUNTER — Inpatient Hospital Stay: Payer: PRIVATE HEALTH INSURANCE | Admitting: Hematology and Oncology

## 2019-10-25 ENCOUNTER — Inpatient Hospital Stay: Payer: PRIVATE HEALTH INSURANCE | Attending: Hematology and Oncology

## 2019-10-25 ENCOUNTER — Other Ambulatory Visit: Payer: Self-pay

## 2019-10-25 VITALS — BP 145/80 | HR 87 | Temp 98.5°F | Resp 18 | Ht 66.0 in | Wt 202.4 lb

## 2019-10-25 DIAGNOSIS — Z79899 Other long term (current) drug therapy: Secondary | ICD-10-CM | POA: Insufficient documentation

## 2019-10-25 DIAGNOSIS — Z9884 Bariatric surgery status: Secondary | ICD-10-CM | POA: Diagnosis not present

## 2019-10-25 DIAGNOSIS — Z7982 Long term (current) use of aspirin: Secondary | ICD-10-CM | POA: Diagnosis not present

## 2019-10-25 DIAGNOSIS — Z0000 Encounter for general adult medical examination without abnormal findings: Secondary | ICD-10-CM

## 2019-10-25 DIAGNOSIS — Z170 Estrogen receptor positive status [ER+]: Secondary | ICD-10-CM | POA: Diagnosis not present

## 2019-10-25 DIAGNOSIS — C50411 Malignant neoplasm of upper-outer quadrant of right female breast: Secondary | ICD-10-CM | POA: Diagnosis not present

## 2019-10-25 DIAGNOSIS — M8580 Other specified disorders of bone density and structure, unspecified site: Secondary | ICD-10-CM | POA: Insufficient documentation

## 2019-10-25 DIAGNOSIS — D509 Iron deficiency anemia, unspecified: Secondary | ICD-10-CM | POA: Diagnosis not present

## 2019-10-25 DIAGNOSIS — Z791 Long term (current) use of non-steroidal anti-inflammatories (NSAID): Secondary | ICD-10-CM | POA: Diagnosis not present

## 2019-10-25 DIAGNOSIS — Z9013 Acquired absence of bilateral breasts and nipples: Secondary | ICD-10-CM | POA: Insufficient documentation

## 2019-10-25 DIAGNOSIS — Z853 Personal history of malignant neoplasm of breast: Secondary | ICD-10-CM | POA: Insufficient documentation

## 2019-10-25 LAB — CBC WITH DIFFERENTIAL (CANCER CENTER ONLY)
Abs Immature Granulocytes: 0.01 10*3/uL (ref 0.00–0.07)
Basophils Absolute: 0 10*3/uL (ref 0.0–0.1)
Basophils Relative: 1 %
Eosinophils Absolute: 0.3 10*3/uL (ref 0.0–0.5)
Eosinophils Relative: 6 %
HCT: 41.1 % (ref 36.0–46.0)
Hemoglobin: 13 g/dL (ref 12.0–15.0)
Immature Granulocytes: 0 %
Lymphocytes Relative: 26 %
Lymphs Abs: 1.2 10*3/uL (ref 0.7–4.0)
MCH: 27.2 pg (ref 26.0–34.0)
MCHC: 31.6 g/dL (ref 30.0–36.0)
MCV: 86 fL (ref 80.0–100.0)
Monocytes Absolute: 0.6 10*3/uL (ref 0.1–1.0)
Monocytes Relative: 12 %
Neutro Abs: 2.7 10*3/uL (ref 1.7–7.7)
Neutrophils Relative %: 55 %
Platelet Count: 168 10*3/uL (ref 150–400)
RBC: 4.78 MIL/uL (ref 3.87–5.11)
RDW: 14.1 % (ref 11.5–15.5)
WBC Count: 4.9 10*3/uL (ref 4.0–10.5)
nRBC: 0 % (ref 0.0–0.2)

## 2019-10-25 LAB — IRON AND TIBC
Iron: 53 ug/dL (ref 41–142)
Saturation Ratios: 11 % — ABNORMAL LOW (ref 21–57)
TIBC: 496 ug/dL — ABNORMAL HIGH (ref 236–444)
UIBC: 444 ug/dL — ABNORMAL HIGH (ref 120–384)

## 2019-10-25 LAB — CMP (CANCER CENTER ONLY)
ALT: 23 U/L (ref 0–44)
AST: 25 U/L (ref 15–41)
Albumin: 3.7 g/dL (ref 3.5–5.0)
Alkaline Phosphatase: 125 U/L (ref 38–126)
Anion gap: 9 (ref 5–15)
BUN: 12 mg/dL (ref 8–23)
CO2: 26 mmol/L (ref 22–32)
Calcium: 9 mg/dL (ref 8.9–10.3)
Chloride: 105 mmol/L (ref 98–111)
Creatinine: 0.7 mg/dL (ref 0.44–1.00)
GFR, Est AFR Am: >60 mL/min (ref >60)
GFR, Estimated: >60 mL/min (ref >60)
Glucose, Bld: 111 mg/dL — ABNORMAL HIGH (ref 70–99)
Potassium: 4.5 mmol/L (ref 3.5–5.1)
Sodium: 140 mmol/L (ref 135–145)
Total Bilirubin: 0.4 mg/dL (ref 0.3–1.2)
Total Protein: 6.9 g/dL (ref 6.5–8.1)

## 2019-10-25 LAB — LIPID PANEL
Cholesterol: 156 mg/dL (ref 0–200)
HDL: 58 mg/dL (ref >40)
LDL Cholesterol: 90 mg/dL (ref 0–99)
Total CHOL/HDL Ratio: 2.7 RATIO
Triglycerides: 38 mg/dL (ref <150)
VLDL: 8 mg/dL (ref 0–40)

## 2019-10-25 LAB — VITAMIN B12: Vitamin B-12: 726 pg/mL (ref 180–914)

## 2019-10-25 LAB — FERRITIN: Ferritin: 9 ng/mL — ABNORMAL LOW (ref 11–307)

## 2019-10-25 LAB — VITAMIN D 25 HYDROXY (VIT D DEFICIENCY, FRACTURES): Vit D, 25-Hydroxy: 30.89 ng/mL (ref 30–100)

## 2019-10-25 NOTE — Assessment & Plan Note (Signed)
History of 2 primary right breast cancers: on observation since completed 7 years [facility] 10-2014, clinically doing well.   Osteopenia . She will need another bone density test.  Surveillance: Breast exam without any abnormalities bilateral reconstructed breasts. Hysterectomy with oophorectomy 2008 prophylactic left mastectomy, bilateral breast reconstructions

## 2019-10-25 NOTE — Assessment & Plan Note (Signed)
Iron deficiency related to prior gastric bypass surgery 10/19/2018: Iron saturation 10%, hemoglobin 11.9, MCV 84.5, ferritin 8 Prior treatment: IV iron: 2014 and 2016

## 2019-10-26 ENCOUNTER — Telehealth: Payer: Self-pay | Admitting: Hematology and Oncology

## 2019-10-26 NOTE — Telephone Encounter (Signed)
I could not reach patient regarding schedule  °

## 2019-10-30 ENCOUNTER — Telehealth: Payer: Self-pay | Admitting: Hematology and Oncology

## 2019-10-30 NOTE — Telephone Encounter (Signed)
Scheduled appt per 11/2 sch message - pt is aware of appt date and time   

## 2019-11-02 ENCOUNTER — Telehealth: Payer: Self-pay | Admitting: Hematology and Oncology

## 2019-11-02 ENCOUNTER — Other Ambulatory Visit: Payer: Self-pay | Admitting: Hematology and Oncology

## 2019-11-02 NOTE — Telephone Encounter (Signed)
Iron studies reveal profound iron deficiency anemia. IV Venofer will be ordered.

## 2019-11-08 ENCOUNTER — Other Ambulatory Visit: Payer: Self-pay

## 2019-11-08 ENCOUNTER — Inpatient Hospital Stay: Payer: PRIVATE HEALTH INSURANCE | Attending: Hematology and Oncology

## 2019-11-08 VITALS — BP 146/74 | HR 83 | Temp 98.4°F | Resp 16

## 2019-11-08 DIAGNOSIS — D508 Other iron deficiency anemias: Secondary | ICD-10-CM | POA: Insufficient documentation

## 2019-11-08 DIAGNOSIS — Z79811 Long term (current) use of aromatase inhibitors: Secondary | ICD-10-CM | POA: Diagnosis not present

## 2019-11-08 DIAGNOSIS — D509 Iron deficiency anemia, unspecified: Secondary | ICD-10-CM

## 2019-11-08 DIAGNOSIS — C50411 Malignant neoplasm of upper-outer quadrant of right female breast: Secondary | ICD-10-CM | POA: Diagnosis not present

## 2019-11-08 DIAGNOSIS — Z9013 Acquired absence of bilateral breasts and nipples: Secondary | ICD-10-CM | POA: Diagnosis not present

## 2019-11-08 DIAGNOSIS — Z170 Estrogen receptor positive status [ER+]: Secondary | ICD-10-CM | POA: Diagnosis not present

## 2019-11-08 DIAGNOSIS — Z9884 Bariatric surgery status: Secondary | ICD-10-CM | POA: Diagnosis not present

## 2019-11-08 MED ORDER — ACETAMINOPHEN 325 MG PO TABS
ORAL_TABLET | ORAL | Status: AC
  Filled 2019-11-08: qty 2

## 2019-11-08 MED ORDER — SODIUM CHLORIDE 0.9 % IV SOLN
Freq: Once | INTRAVENOUS | Status: AC
  Administered 2019-11-08: 09:00:00 via INTRAVENOUS
  Filled 2019-11-08: qty 250

## 2019-11-08 MED ORDER — ACETAMINOPHEN 325 MG PO TABS
650.0000 mg | ORAL_TABLET | Freq: Once | ORAL | Status: AC
  Administered 2019-11-08: 650 mg via ORAL

## 2019-11-08 MED ORDER — SODIUM CHLORIDE 0.9 % IV SOLN
200.0000 mg | Freq: Once | INTRAVENOUS | Status: AC
  Administered 2019-11-08: 200 mg via INTRAVENOUS
  Filled 2019-11-08: qty 10

## 2019-11-08 NOTE — Patient Instructions (Signed)
Iron Sucrose injection What is this medicine? IRON SUCROSE (AHY ern SOO krohs) is an iron complex. Iron is used to make healthy red blood cells, which carry oxygen and nutrients throughout the body. This medicine is used to treat iron deficiency anemia in people with chronic kidney disease. This medicine may be used for other purposes; ask your health care provider or pharmacist if you have questions. COMMON BRAND NAME(S): Venofer What should I tell my health care provider before I take this medicine? They need to know if you have any of these conditions:  anemia not caused by low iron levels  heart disease  high levels of iron in the blood  kidney disease  liver disease  an unusual or allergic reaction to iron, other medicines, foods, dyes, or preservatives  pregnant or trying to get pregnant  breast-feeding How should I use this medicine? This medicine is for infusion into a vein. It is given by a health care professional in a hospital or clinic setting. Talk to your pediatrician regarding the use of this medicine in children. While this drug may be prescribed for children as young as 2 years for selected conditions, precautions do apply. Overdosage: If you think you have taken too much of this medicine contact a poison control center or emergency room at once. NOTE: This medicine is only for you. Do not share this medicine with others. What if I miss a dose? It is important not to miss your dose. Call your doctor or health care professional if you are unable to keep an appointment. What may interact with this medicine? Do not take this medicine with any of the following medications:  deferoxamine  dimercaprol  other iron products This medicine may also interact with the following medications:  chloramphenicol  deferasirox This list may not describe all possible interactions. Give your health care provider a list of all the medicines, herbs, non-prescription drugs, or  dietary supplements you use. Also tell them if you smoke, drink alcohol, or use illegal drugs. Some items may interact with your medicine. What should I watch for while using this medicine? Visit your doctor or healthcare professional regularly. Tell your doctor or healthcare professional if your symptoms do not start to get better or if they get worse. You may need blood work done while you are taking this medicine. You may need to follow a special diet. Talk to your doctor. Foods that contain iron include: whole grains/cereals, dried fruits, beans, or peas, leafy green vegetables, and organ meats (liver, kidney). What side effects may I notice from receiving this medicine? Side effects that you should report to your doctor or health care professional as soon as possible:  allergic reactions like skin rash, itching or hives, swelling of the face, lips, or tongue  breathing problems  changes in blood pressure  cough  fast, irregular heartbeat  feeling faint or lightheaded, falls  fever or chills  flushing, sweating, or hot feelings  joint or muscle aches/pains  seizures  swelling of the ankles or feet  unusually weak or tired Side effects that usually do not require medical attention (report to your doctor or health care professional if they continue or are bothersome):  diarrhea  feeling achy  headache  irritation at site where injected  nausea, vomiting  stomach upset  tiredness This list may not describe all possible side effects. Call your doctor for medical advice about side effects. You may report side effects to FDA at 1-800-FDA-1088. Where should I keep   my medicine? This drug is given in a hospital or clinic and will not be stored at home. NOTE: This sheet is a summary. It may not cover all possible information. If you have questions about this medicine, talk to your doctor, pharmacist, or health care provider.  2020 Elsevier/Gold Standard (2011-09-24  17:14:35) Coronavirus (COVID-19) Are you at risk?  Are you at risk for the Coronavirus (COVID-19)?  To be considered HIGH RISK for Coronavirus (COVID-19), you have to meet the following criteria:  . Traveled to [place], [place], [place], [place] or [place]; or in the [place] to [place], [place], [place], or [place]; and have fever, cough, and shortness of breath within the last 2 weeks of travel OR . Been in close contact with a person diagnosed with COVID-19 within the last 2 weeks and have fever, cough, and shortness of breath . IF YOU DO NOT MEET THESE CRITERIA, YOU ARE CONSIDERED LOW RISK FOR COVID-19.  What to do if you are HIGH RISK for COVID-19?  . If you are having a medical emergency, call 911. . Seek medical care right away. Before you go to a doctor's office, urgent care or emergency department, call ahead and tell them about your recent travel, contact with someone diagnosed with COVID-19, and your symptoms. You should receive instructions from your physician's office regarding next steps of care.  . When you arrive at healthcare provider, tell the healthcare staff immediately you have returned from visiting [place], [place], [place], [place] or [place]; or traveled in the [place] to [place], [place], [place], or [place]; in the last two weeks or you have been in close contact with a person diagnosed with COVID-19 in the last 2 weeks.   . Tell the health care staff about your symptoms: fever, cough and shortness of breath. . After you have been seen by a medical provider, you will be either: o Tested for (COVID-19) and discharged home on quarantine except to seek medical care if symptoms worsen, and asked to  - Stay home and avoid contact with others until you get your results (4-5 days)  - Avoid travel on public transportation if possible (such as bus, train, or airplane) or o Sent to the Emergency Department by EMS for evaluation, COVID-19 testing, and  possible admission depending on your condition and test results.  What to do if you are LOW RISK for COVID-19?  Reduce your risk of any infection by using the same precautions used for avoiding the common cold or flu:  . Wash your hands often with soap and warm water for at least 20 seconds.  If soap and water are not readily available, use an alcohol-based hand sanitizer with at least 60% alcohol.  . If coughing or sneezing, cover your mouth and nose by coughing or sneezing into the elbow areas of your shirt or coat, into a tissue or into your sleeve (not your hands). . Avoid shaking hands with others and consider head nods or verbal greetings only. . Avoid touching your eyes, nose, or mouth with unwashed hands.  . Avoid close contact with people who are sick. . Avoid places or events with large numbers of people in one location, like concerts or sporting events. . Carefully consider travel plans you have or are making. . If you are planning any travel outside or inside the [place], visit the CDC's Travelers' Health webpage for the latest health notices. . If you have some symptoms but not all   symptoms, continue to monitor at home and seek medical attention if your symptoms worsen. . If you are having a medical emergency, call 911.   ADDITIONAL HEALTHCARE OPTIONS FOR PATIENTS  [facility] / e-Visit: [email or web address]         [facility]: [phone]  [facility]: [phone]                   [facility]: [phone]  

## 2019-11-10 ENCOUNTER — Other Ambulatory Visit: Payer: Self-pay | Admitting: Family Medicine

## 2019-11-10 DIAGNOSIS — F900 Attention-deficit hyperactivity disorder, predominantly inattentive type: Secondary | ICD-10-CM

## 2019-11-15 ENCOUNTER — Other Ambulatory Visit: Payer: Self-pay

## 2019-11-15 ENCOUNTER — Inpatient Hospital Stay: Payer: PRIVATE HEALTH INSURANCE

## 2019-11-15 VITALS — BP 137/75 | HR 84 | Temp 98.1°F | Resp 16

## 2019-11-15 DIAGNOSIS — D508 Other iron deficiency anemias: Secondary | ICD-10-CM | POA: Diagnosis not present

## 2019-11-15 DIAGNOSIS — D509 Iron deficiency anemia, unspecified: Secondary | ICD-10-CM

## 2019-11-15 MED ORDER — SODIUM CHLORIDE 0.9 % IV SOLN
200.0000 mg | Freq: Once | INTRAVENOUS | Status: AC
  Administered 2019-11-15: 200 mg via INTRAVENOUS
  Filled 2019-11-15: qty 10

## 2019-11-15 MED ORDER — ACETAMINOPHEN 325 MG PO TABS: 650.0000 mg | ORAL_TABLET | Freq: Once | ORAL | Status: DC

## 2019-11-15 MED ORDER — ACETAMINOPHEN 325 MG PO TABS
ORAL_TABLET | ORAL | Status: AC
  Filled 2019-11-15: qty 2

## 2019-11-15 MED ORDER — SODIUM CHLORIDE 0.9 % IV SOLN
Freq: Once | INTRAVENOUS | Status: AC
  Administered 2019-11-15: 09:00:00 via INTRAVENOUS
  Filled 2019-11-15: qty 250

## 2019-11-15 NOTE — Progress Notes (Signed)
Patient declined 30 minutes post observation after Venofer infusion. Stayed for only 15 minutes, verbal and printed education provided. Vitals obtained and documented.

## 2019-11-15 NOTE — Patient Instructions (Signed)
Iron Sucrose injection What is this medicine? IRON SUCROSE (AHY ern SOO krohs) is an iron complex. Iron is used to make healthy red blood cells, which carry oxygen and nutrients throughout the body. This medicine is used to treat iron deficiency anemia in people with chronic kidney disease. This medicine may be used for other purposes; ask your health care provider or pharmacist if you have questions. COMMON BRAND NAME(S): Venofer What should I tell my health care provider before I take this medicine? They need to know if you have any of these conditions:  anemia not caused by low iron levels  heart disease  high levels of iron in the blood  kidney disease  liver disease  an unusual or allergic reaction to iron, other medicines, foods, dyes, or preservatives  pregnant or trying to get pregnant  breast-feeding How should I use this medicine? This medicine is for infusion into a vein. It is given by a health care professional in a hospital or clinic setting. Talk to your pediatrician regarding the use of this medicine in children. While this drug may be prescribed for children as young as 2 years for selected conditions, precautions do apply. Overdosage: If you think you have taken too much of this medicine contact a poison control center or emergency room at once. NOTE: This medicine is only for you. Do not share this medicine with others. What if I miss a dose? It is important not to miss your dose. Call your doctor or health care professional if you are unable to keep an appointment. What may interact with this medicine? Do not take this medicine with any of the following medications:  deferoxamine  dimercaprol  other iron products This medicine may also interact with the following medications:  chloramphenicol  deferasirox This list may not describe all possible interactions. Give your health care provider a list of all the medicines, herbs, non-prescription drugs, or  dietary supplements you use. Also tell them if you smoke, drink alcohol, or use illegal drugs. Some items may interact with your medicine. What should I watch for while using this medicine? Visit your doctor or healthcare professional regularly. Tell your doctor or healthcare professional if your symptoms do not start to get better or if they get worse. You may need blood work done while you are taking this medicine. You may need to follow a special diet. Talk to your doctor. Foods that contain iron include: whole grains/cereals, dried fruits, beans, or peas, leafy green vegetables, and organ meats (liver, kidney). What side effects may I notice from receiving this medicine? Side effects that you should report to your doctor or health care professional as soon as possible:  allergic reactions like skin rash, itching or hives, swelling of the face, lips, or tongue  breathing problems  changes in blood pressure  cough  fast, irregular heartbeat  feeling faint or lightheaded, falls  fever or chills  flushing, sweating, or hot feelings  joint or muscle aches/pains  seizures  swelling of the ankles or feet  unusually weak or tired Side effects that usually do not require medical attention (report to your doctor or health care professional if they continue or are bothersome):  diarrhea  feeling achy  headache  irritation at site where injected  nausea, vomiting  stomach upset  tiredness This list may not describe all possible side effects. Call your doctor for medical advice about side effects. You may report side effects to FDA at 1-800-FDA-1088. Where should I keep   my medicine? This drug is given in a hospital or clinic and will not be stored at home. NOTE: This sheet is a summary. It may not cover all possible information. If you have questions about this medicine, talk to your doctor, pharmacist, or health care provider.  2020 Elsevier/Gold Standard (2011-09-24  17:14:35) Coronavirus (COVID-19) Are you at risk?  Are you at risk for the Coronavirus (COVID-19)?  To be considered HIGH RISK for Coronavirus (COVID-19), you have to meet the following criteria:  . Traveled to [place], [place], [place], [place] or [place]; or in the [place] to [place], [place], [place], or [place]; and have fever, cough, and shortness of breath within the last 2 weeks of travel OR . Been in close contact with a person diagnosed with COVID-19 within the last 2 weeks and have fever, cough, and shortness of breath . IF YOU DO NOT MEET THESE CRITERIA, YOU ARE CONSIDERED LOW RISK FOR COVID-19.  What to do if you are HIGH RISK for COVID-19?  . If you are having a medical emergency, call 911. . Seek medical care right away. Before you go to a doctor's office, urgent care or emergency department, call ahead and tell them about your recent travel, contact with someone diagnosed with COVID-19, and your symptoms. You should receive instructions from your physician's office regarding next steps of care.  . When you arrive at healthcare provider, tell the healthcare staff immediately you have returned from visiting [place], [place], [place], [place] or [place]; or traveled in the [place] to [place], [place], [place], or [place]; in the last two weeks or you have been in close contact with a person diagnosed with COVID-19 in the last 2 weeks.   . Tell the health care staff about your symptoms: fever, cough and shortness of breath. . After you have been seen by a medical provider, you will be either: o Tested for (COVID-19) and discharged home on quarantine except to seek medical care if symptoms worsen, and asked to  - Stay home and avoid contact with others until you get your results (4-5 days)  - Avoid travel on public transportation if possible (such as bus, train, or airplane) or o Sent to the Emergency Department by EMS for evaluation, COVID-19 testing, and  possible admission depending on your condition and test results.  What to do if you are LOW RISK for COVID-19?  Reduce your risk of any infection by using the same precautions used for avoiding the common cold or flu:  . Wash your hands often with soap and warm water for at least 20 seconds.  If soap and water are not readily available, use an alcohol-based hand sanitizer with at least 60% alcohol.  . If coughing or sneezing, cover your mouth and nose by coughing or sneezing into the elbow areas of your shirt or coat, into a tissue or into your sleeve (not your hands). . Avoid shaking hands with others and consider head nods or verbal greetings only. . Avoid touching your eyes, nose, or mouth with unwashed hands.  . Avoid close contact with people who are sick. . Avoid places or events with large numbers of people in one location, like concerts or sporting events. . Carefully consider travel plans you have or are making. . If you are planning any travel outside or inside the [place], visit the CDC's Travelers' Health webpage for the latest health notices. . If you have some symptoms but not all   symptoms, continue to monitor at home and seek medical attention if your symptoms worsen. . If you are having a medical emergency, call 911.   ADDITIONAL HEALTHCARE OPTIONS FOR PATIENTS  [facility] / e-Visit: [email or web address]         [facility]: [phone]  [facility]: [phone]                   [facility]: [phone]  

## 2019-11-20 ENCOUNTER — Encounter: Payer: Self-pay | Admitting: Family Medicine

## 2019-11-20 MED ORDER — VITAMIN D (ERGOCALCIFEROL) 1.25 MG (50000 UNIT) PO CAPS: 50000.0000 [IU] | ORAL_CAPSULE | ORAL | 0 refills | Status: DC

## 2019-11-22 ENCOUNTER — Inpatient Hospital Stay: Payer: PRIVATE HEALTH INSURANCE

## 2019-11-22 ENCOUNTER — Other Ambulatory Visit: Payer: Self-pay

## 2019-11-22 VITALS — BP 146/76 | HR 85 | Temp 98.5°F | Resp 16

## 2019-11-22 DIAGNOSIS — D509 Iron deficiency anemia, unspecified: Secondary | ICD-10-CM

## 2019-11-22 DIAGNOSIS — D508 Other iron deficiency anemias: Secondary | ICD-10-CM | POA: Diagnosis not present

## 2019-11-22 MED ORDER — SODIUM CHLORIDE 0.9 % IV SOLN
200.0000 mg | Freq: Once | INTRAVENOUS | Status: AC
  Administered 2019-11-22: 10:00:00 200 mg via INTRAVENOUS
  Filled 2019-11-22: qty 10

## 2019-11-22 MED ORDER — SODIUM CHLORIDE 0.9 % IV SOLN
Freq: Once | INTRAVENOUS | Status: AC
  Administered 2019-11-22: 09:00:00 via INTRAVENOUS
  Filled 2019-11-22: qty 250

## 2019-11-22 MED ORDER — ACETAMINOPHEN 325 MG PO TABS: 650.0000 mg | ORAL_TABLET | Freq: Once | ORAL | Status: DC

## 2019-11-22 NOTE — Patient Instructions (Signed)

## 2019-11-29 ENCOUNTER — Inpatient Hospital Stay: Payer: PRIVATE HEALTH INSURANCE | Attending: Hematology and Oncology

## 2019-11-29 ENCOUNTER — Other Ambulatory Visit: Payer: Self-pay

## 2019-11-29 VITALS — BP 143/70 | HR 85 | Temp 98.8°F | Resp 16

## 2019-11-29 DIAGNOSIS — Z79811 Long term (current) use of aromatase inhibitors: Secondary | ICD-10-CM | POA: Insufficient documentation

## 2019-11-29 DIAGNOSIS — Z9884 Bariatric surgery status: Secondary | ICD-10-CM | POA: Insufficient documentation

## 2019-11-29 DIAGNOSIS — D508 Other iron deficiency anemias: Secondary | ICD-10-CM | POA: Insufficient documentation

## 2019-11-29 DIAGNOSIS — Z170 Estrogen receptor positive status [ER+]: Secondary | ICD-10-CM | POA: Diagnosis not present

## 2019-11-29 DIAGNOSIS — D509 Iron deficiency anemia, unspecified: Secondary | ICD-10-CM

## 2019-11-29 DIAGNOSIS — C50411 Malignant neoplasm of upper-outer quadrant of right female breast: Secondary | ICD-10-CM | POA: Diagnosis not present

## 2019-11-29 MED ORDER — SODIUM CHLORIDE 0.9 % IV SOLN
Freq: Once | INTRAVENOUS | Status: AC
  Administered 2019-11-29: 09:00:00 via INTRAVENOUS
  Filled 2019-11-29: qty 250

## 2019-11-29 MED ORDER — SODIUM CHLORIDE 0.9 % IV SOLN
200.0000 mg | Freq: Once | INTRAVENOUS | Status: AC
  Administered 2019-11-29: 200 mg via INTRAVENOUS
  Filled 2019-11-29: qty 10

## 2019-11-29 NOTE — Patient Instructions (Signed)

## 2020-01-10 ENCOUNTER — Other Ambulatory Visit: Payer: Self-pay

## 2020-01-10 ENCOUNTER — Ambulatory Visit
Admission: RE | Admit: 2020-01-10 | Discharge: 2020-01-10 | Disposition: A | Discharge status: Home / Self Care | Payer: PRIVATE HEALTH INSURANCE | Source: Ambulatory Visit | Attending: Hematology and Oncology | Admitting: Hematology and Oncology

## 2020-01-10 DIAGNOSIS — Z0000 Encounter for general adult medical examination without abnormal findings: Secondary | ICD-10-CM

## 2020-02-09 ENCOUNTER — Other Ambulatory Visit: Payer: Self-pay | Admitting: Family Medicine

## 2020-02-09 DIAGNOSIS — F900 Attention-deficit hyperactivity disorder, predominantly inattentive type: Secondary | ICD-10-CM

## 2020-02-09 NOTE — Telephone Encounter (Signed)
This is a duplicate of this request but will not let me denied it as a duplicate

## 2020-02-09 NOTE — Telephone Encounter (Signed)
Is this okay to refill? 

## 2020-02-11 MED ORDER — LISDEXAMFETAMINE DIMESYLATE 70 MG PO CAPS: 70.0000 mg | ORAL_CAPSULE | Freq: Every day | ORAL | 0 refills | Status: DC

## 2020-03-04 ENCOUNTER — Other Ambulatory Visit: Payer: Self-pay | Admitting: Family Medicine

## 2020-03-04 DIAGNOSIS — E039 Hypothyroidism, unspecified: Secondary | ICD-10-CM

## 2020-03-06 ENCOUNTER — Ambulatory Visit: Payer: PRIVATE HEALTH INSURANCE | Admitting: Family Medicine

## 2020-03-06 ENCOUNTER — Other Ambulatory Visit: Payer: Self-pay

## 2020-03-06 VITALS — BP 126/82 | HR 93 | Temp 98.2°F | Wt 198.8 lb

## 2020-03-06 DIAGNOSIS — F900 Attention-deficit hyperactivity disorder, predominantly inattentive type: Secondary | ICD-10-CM

## 2020-03-06 DIAGNOSIS — I471 Supraventricular tachycardia: Secondary | ICD-10-CM

## 2020-03-06 NOTE — Progress Notes (Signed)
   Subjective:    Patient ID: Christy [name], female    DOB: September 22, 1956, 64 y.o.   MRN: [id number]  HPI She is here for medication management visit.  Review of record indicates that she was seen in the emergency room on March 3 and evaluated for SVT.  That record was reviewed and did show a heart rate above 200.  In the emergency room they were able to convert her to sinus rhythm.  She does have an event monitor on.  Her blood work including TSH looks good.  She does have a previous history of breast cancer with treatment with cardiotoxic medications.  She has been followed by Dr. Martinique for that and was seen last by him 2014. She also has a history of ADHD.  She does take Vyvanse 70 mg and gets roughly 6 hours of benefit out of that.  She also takes 15 of Adderall but usually takes that in the morning.   Review of Systems     Objective:   Physical Exam Alert and in no distress.  Cardiac exam shows regular rhythm without murmurs or gallops.  Lungs are clear to auscultation. Emergency room record blood work and EKG was reviewed.       Assessment & Plan:  SVT (supraventricular tachycardia) ([place]) - Plan: Ambulatory referral to Cardiology  Attention deficit hyperactivity disorder (ADHD), predominantly inattentive type I will refer back to cardiology.  Discussed the fact that they will probably want to map her and try to ablate the pathway for the SVT. Then discussed the ADD.  Recommend she take the Vyvanse in the morning and then roughly 6 hours later take the Adderall to see if that will help get through her workday.  She has a good idea on how to handle this.

## 2020-03-07 ENCOUNTER — Telehealth: Payer: Self-pay

## 2020-03-07 ENCOUNTER — Encounter: Payer: Self-pay | Admitting: Hematology and Oncology

## 2020-03-07 NOTE — Telephone Encounter (Signed)
Called patient left message on personal voice mail, I received a message from [facility] to schedule appointment with him after monitor.Advised to call me back.

## 2020-03-22 NOTE — Telephone Encounter (Signed)
Did receive the monitor strips- will place in MD box.

## 2020-03-22 NOTE — Telephone Encounter (Signed)
Follow Up  Ailene Ravel with [facility] is returning call. States that she will be faxing monitor results. Also would like a call back on Monday.

## 2020-04-04 NOTE — Progress Notes (Signed)
Cardiology Office Note   Date:  04/04/2020   ID:  KAMORI KITCHENS, DOB 09-Sep-1956, MRN [id number]  PCP:  Denita Lung, MD  Cardiologist:   Kynzleigh Bandel Martinique, MD   No chief complaint on file.     History of Present Illness: Christy [name] is a 64 y.o. female who is seen at the request of Dr Redmond School for evaluation of SVT. Has a history of ADD on Adderall. Also history of hypothyroidism. She is a Psychiatry PA. She was seen in the ED at [facility] on March 2 for tachycardia.  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 She felt her heart racing.  She checked her apple watch, which told her that her heart rate was in the 150s. By the time that she got back to her desk and sat down her heart rate was in the 200s. She had an NA in the CDU check her vitals, her heart rate was greater than 200. She presented  to the main emergency department.  EKG consistent with SVT. After attempting vagal maneuver multiple times and administration of 2 L of IV fluid, patient converted to normal sinus rhythm. A Zio patch monitor was placed and she was DC home on no additional medication.   I have seen her in the past. Initially diagnosed with breast CA in 1993. Had chemo and RT. Echo at that time was ok. Had repeat Echo in 2009. Had some MAC but was otherwise normal. Had new focus of breast CA and subsequently underwent bilateral mastectomy and reconstruction. Was on Femara for 7 years and then discontinued. She has been on Adderall for many years as well as Vynase. States she has a hard time functioning without it. 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 She reports episodes of tachycardia since this past summer. Had a bad episode around [place]. Typically symptoms are short lived and less than 10 minutes. Convert on their own. Usually gradually slow down. Feels heart racing or pounding. No syncope. No chest pain or dyspnea. She has been under a lot of stress. Just finalized  her divorce. Her mother just moved to [place] and they are very close. They have been down a worker at work increasing her work load. She is doing much exercise. She is sleeping well.    Past Medical History:  Diagnosis Date  . ADD (attention deficit disorder)   . Allergy    rhinitis  . Anemia   . Arthritis    osteo  . Breast cancer ([place]) [id number]   Breast , skin - basal   . Complication of anesthesia 2004   gastric bypass [facility] ,  acetyl succ-, hard time awaking, placed back on vent for a couiple hour  . Dyspnea   . GERD (gastroesophageal reflux disease)   . History of hiatal hernia   . History of kidney stones    passed  . Hypothyroid   . Internal hemorrhoids   . Obesity   . Pneumonia   . Tubular adenoma of colon     Past Surgical History:  Procedure Laterality Date  . BREAST LUMPECTOMY Right   . CESAREAN SECTION     x 2  . CHOLECYSTECTOMY    . COLONOSCOPY  07/2013  . ESOPHAGOGASTRODUODENOSCOPY    . GASTRIC BYPASS    . JOINT REPLACEMENT    . KNEE ARTHROSCOPY Right    x 3  . LAPAROSCOPIC APPENDECTOMY N/A 04/03/2015  Procedure: APPENDECTOMY LAPAROSCOPIC;  Surgeon: Autumn Messing III, MD;  Location: [facility];  Service: General;  Laterality: N/A;  . LITHOTRIPSY     x3  . MASTECTOMY Bilateral    with lymph node dissection  bil reconstruction 2009  . TOTAL KNEE ARTHROPLASTY Right 12/18/2013   Procedure: TOTAL KNEE ARTHROPLASTY;  Surgeon: Kerin Salen, MD;  Location: [facility];  Service: Orthopedics;  Laterality: Right;  . TOTAL KNEE ARTHROPLASTY Left 06/29/2018  . TOTAL KNEE ARTHROPLASTY Left 06/29/2018   Procedure: LEFT TOTAL KNEE ARTHROPLASTY;  Surgeon: Frederik Pear, MD;  Location: [facility];  Service: Orthopedics;  Laterality: Left;  Marland Kitchen VAGINAL HYSTERECTOMY       Current Outpatient Medications  Medication Sig Dispense Refill  . amphetamine-dextroamphetamine (ADDERALL) 15 MG tablet TAKE 1 TABLET BY MOUTH DAILY. 90 tablet 0  . aspirin EC 81 MG tablet Take 1 tablet (81 mg  total) by mouth 2 (two) times daily. 60 tablet 0  . B Complex-C (B-COMPLEX WITH VITAMIN C) tablet Take 1 tablet by mouth daily.    . celecoxib (CELEBREX) 200 MG capsule Take 400 mg by mouth daily.    . cetirizine (ZYRTEC) 10 MG tablet     . Cholecalciferol (VITAMIN D3) 5000 units CAPS Take 5,000 Units by mouth daily.     . clobetasol cream (TEMOVATE) 8.41 % Apply 1 application topically 2 (two) times daily. (Patient not taking: Reported on 09/27/2019) 45 g 2  . cyclobenzaprine (FLEXERIL) 10 MG tablet Take 10 mg by mouth 3 (three) times daily as needed for muscle spasms.    . cycloSPORINE (RESTASIS) 0.05 % ophthalmic emulsion Place 2 drops into both eyes daily.    . fexofenadine (ALLEGRA) 180 MG tablet Take 180 mg by mouth daily as needed for allergies or rhinitis.    Marland Kitchen ibuprofen (ADVIL) 600 MG tablet     . levothyroxine (SYNTHROID) 50 MCG tablet TAKE ONE TABLET BY MOUTH ONCE DAILY 90 tablet 3  . lisdexamfetamine (VYVANSE) 70 MG capsule Take 1 capsule (70 mg total) by mouth daily. 90 capsule 0  . MAGNESIUM OXIDE PO Take 1 tablet by mouth daily.    . meclizine (ANTIVERT) 12.5 MG tablet Take 1 tablet (12.5 mg total) by mouth 3 (three) times daily as needed for dizziness. 20 tablet 0  . Multiple Vitamin (MULTIVITAMIN) capsule Take 3 capsules by mouth daily.     Marland Kitchen triamcinolone cream (KENALOG) 0.5 % Apply 1 application topically 3 (three) times daily. 30 g 5  . valACYclovir (VALTREX) 500 MG tablet Take 1 tablet (500 mg total) by mouth daily. 30 tablet 0  . venlafaxine XR (EFFEXOR-XR) 150 MG 24 hr capsule TAKE 1 CAPSULE BY MOUTH EVERY DAY WITH BREAKFAST 90 capsule 3  . Vitamin D, Ergocalciferol, (DRISDOL) 1.25 MG (50000 UT) CAPS capsule Take 1 capsule (50,000 Units total) by mouth every 7 (seven) days. (Patient not taking: Reported on 03/06/2020) 8 capsule 0   No current facility-administered medications for this visit.    Allergies:   Morphine and related, Levaquin [levofloxacin in d5w], and Tylenol  [acetaminophen]    Social History:  The patient  reports that she has never smoked. She has never used smokeless tobacco. She reports current alcohol use of about 7.0 standard drinks of alcohol per week. She reports that she does not use drugs.   Family History:  The patient's family history includes Atrial fibrillation in her mother; Breast cancer in her paternal aunt; COPD in her father; Colon polyps in her mother.  ROS:  Please see the history of present illness.   Otherwise, review of systems are positive for iron deficiency for which she received iron infusions. She did have a lot of vertigo this past year but this has largely resolved. She has seasonal allergies for which she takes Flonase or anti histamine.   All other systems are reviewed and negative.    PHYSICAL EXAM: VS:  There were no vitals taken for this visit. , BMI There is no height or weight on file to calculate BMI. GEN: Well nourished, well developed, in no acute distress  HEENT: normal  Neck: no JVD, carotid bruits, or masses Cardiac: RRR; soft 1/6 systolic murmur RUSB. no rubs, or gallops,no edema  Respiratory:  clear to auscultation bilaterally, normal work of breathing GI: soft, nontender, nondistended, + BS MS: no deformity or atrophy  Skin: warm and dry, no rash Neuro:  Strength and sensation are intact Psych: euthymic mood, full affect   EKG:  EKG is ordered today. The ekg ordered today demonstrates sinus tachy rate 105. Otherwise normal. I have personally reviewed and interpreted this study.    Recent Labs: 10/25/2019: ALT 23; BUN 12; Creatinine 0.70; Hemoglobin 13.0; Platelet Count 168; Potassium 4.5; Sodium 140    Lipid Panel    Component Value Date/Time   CHOL 156 10/25/2019 0950   CHOL 121 10/14/2017 1541   TRIG 38 10/25/2019 0950   TRIG 51 10/14/2017 1541   HDL 58 10/25/2019 0950   HDL 49 10/14/2017 1541   CHOLHDL 2.7 10/25/2019 0950   VLDL 8 10/25/2019 0950   LDLCALC 90 10/25/2019 0950     LDLCALC 62 10/14/2017 1541    Labs dated 02/27/20: normal TSH, CMET, CBC  Wt Readings from Last 3 Encounters:  03/06/20 198 lb 12.8 oz (90.2 kg)  10/25/19 202 lb 6.4 oz (91.8 kg)  10/13/19 202 lb 12.8 oz (92 kg)      Other studies Reviewed: Additional studies/ records that were reviewed today include:   Zio patch monitor reviewed. One 4 beat run of NSVT. 85 episodes of SVT longest 8.2 seconds. Initiated with PAC.    ASSESSMENT AND PLAN:  1.  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  Exacerbated by caffeine and Adderal/Vynase. Multiple recent stressors. Labs ok. Echo in the past OK. For now recommend she eliminate caffeine. Will add Diltiazem CD 120 mg daily. Hopefully with these measures will be able to continue Adderall/Vynase. I will follow up in 3 months. Encourage regular aerobic exercise.  2. History of breast CA. Remote Chemo and RT 3. S/p gastric banding.  4. Hypothyroid. TSH normal.   Current medicines are reviewed at length with the patient today.  The patient does not have concerns regarding medicines.  The following changes have been made:  Add diltiazem CD 120 mg daily  Labs/ tests ordered today include: none No orders of the defined types were placed in this encounter.    Disposition:   FU with me in 3 months  Signed, Zahid Carneiro Martinique, MD  04/04/2020 8:12 AM    [facility] [address] [place], [place], [address] Phone [phone], Fax [phone]

## 2020-04-05 ENCOUNTER — Encounter: Payer: Self-pay | Admitting: Cardiology

## 2020-04-05 ENCOUNTER — Other Ambulatory Visit: Payer: Self-pay

## 2020-04-05 ENCOUNTER — Ambulatory Visit: Payer: PRIVATE HEALTH INSURANCE | Admitting: Cardiology

## 2020-04-05 VITALS — BP 132/86 | HR 119 | Ht 66.0 in | Wt 202.4 lb

## 2020-04-05 DIAGNOSIS — I471 Supraventricular tachycardia: Secondary | ICD-10-CM | POA: Diagnosis not present

## 2020-04-05 DIAGNOSIS — R000 Tachycardia, unspecified: Secondary | ICD-10-CM | POA: Diagnosis not present

## 2020-04-05 MED ORDER — DILTIAZEM HCL ER COATED BEADS 120 MG PO CP24: 120.0000 mg | ORAL_CAPSULE | Freq: Every day | ORAL | 3 refills | Status: DC

## 2020-04-05 NOTE — Patient Instructions (Signed)
Start diltiazem CD 120 mg daily   Eliminate caffeine  Follow up in 3 months

## 2020-04-24 ENCOUNTER — Telehealth: Payer: Self-pay

## 2020-04-24 NOTE — Telephone Encounter (Signed)
   Primary Cardiologist: Peter Martinique, MD  Chart revisited as part of pre-operative protocol coverage. I spoke with patient who affirms no new symptoms since recent OV. She is feeling somewhat better on the diltiazem. She is trying to increase her aerobic activity to feel more in shape. Based on ACC/AHA guidelines, the patient would be at acceptable risk for the planned procedure without further cardiovascular testing - with the acception that we would recommend avoiding use of epinephrine given her cardiac history.  I will route this recommendation to the requesting party via Epic fax function and remove from pre-op pool.  Please call with questions.  Charlie Pitter, PA-C 04/24/2020, 4:16 PM

## 2020-04-24 NOTE — Telephone Encounter (Signed)
   Primary Cardiologist: Peter Martinique, MD  Chart reviewed as part of pre-operative protocol coverage. Patient was contacted 04/24/2020 in reference to pre-operative risk assessment for pending surgery as outlined below.  Christy [name] was last seen on 04/05/20 by Dr. Martinique as a new patient. She is a psychiatry PA. She has history of ADD, hypothyroidism, SVT, breast CA, allergic rhinitis, anemia, arthritis, gastric bypass, hiatal hernia, GERD, obesity. She recently had issus with tachycardia. Diltiazem was added for SVT felt most likely PAT. Revised cardiac risk index is 0.4% indicating low risk of CV complications.   Will route to Dr. Martinique to get his thoughts on dental use of epinephrine with her history. Dr. Martinique - Please route response to P CV DIV PREOP (the pre-op pool). Thank you.  Charlie Pitter, PA-C 04/24/2020, 4:06 PM

## 2020-04-24 NOTE — Telephone Encounter (Signed)
   [facility] Pre-operative Risk Assessment    Request for surgical clearance:  1. What type of surgery is being performed? Extraction of teeth 9 and 28 with bone graft and placement of 2 dental implants   2. When is this surgery scheduled? tbd  3. What type of clearance is required (medical clearance vs. Pharmacy clearance to hold med vs. Both)? both  4. Are there any medications that need to be held prior to surgery and how long? unknown  5. Practice name and name of physician performing surgery? [facility]  6. What is your office phone number [phone]   7.   What is your office fax number [phone]  8.   Anesthesia type (None, local, MAC, general) ? Intravenous sedation with local epinephrine   Christy [name] Christy [name] 04/24/2020, 2:28 PM  _________________________________________________________________   (provider comments below)

## 2020-04-24 NOTE — Telephone Encounter (Signed)
I would avoid use of epinephrine.  Thanks. Collier Salina

## 2020-05-08 ENCOUNTER — Other Ambulatory Visit: Payer: Self-pay | Admitting: Cardiology

## 2020-06-17 ENCOUNTER — Other Ambulatory Visit: Payer: Self-pay | Admitting: Family Medicine

## 2020-06-17 DIAGNOSIS — F900 Attention-deficit hyperactivity disorder, predominantly inattentive type: Secondary | ICD-10-CM

## 2020-06-17 NOTE — Telephone Encounter (Signed)
[facility] is requesting to fill pt vyvanse. Please advise [facility]

## 2020-06-21 ENCOUNTER — Encounter: Payer: Self-pay | Admitting: Hematology and Oncology

## 2020-06-30 NOTE — Progress Notes (Signed)
Cardiology Office Note:    Date:  07/05/2020   ID:  KEZIA BENEVIDES, DOB [birth year], MRN [id number]  PCP:  Denita Lung, MD  Cardiologist:  Peter Martinique, MD  Electrophysiologist:  None   Referring MD: Denita Lung, MD   Chief Complaint: follow-up of SVT  History of Present Illness:    MALAISHA [name] is a 64 y.o. female with a history of SVT, hypothyroidism, ADD on Adderall and Vyvanse, and breast cancer s/p chemo and radiation as well as bilateral mastectomy and reconstruction who is followed by Dr. Martinique and presents today for routine follow-up.   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 She was seen in the [facility] ED in 02/2020 with palpitations and was noted to be in SVT. She eventually converted to normal sinus rhythm after multiple vagal maneuver attempts and administration of 2 L of IV fluids. TSH and electrolytes normal. Zio monitor was placed and she was discharged home. Last Echo from 2009 showed normal EF. She was seen by Dr. Martinique for follow-up in 03/2020. At that visit, she reported several episodes of tachycardia since Summer of 2020. Symptoms typically short lived (less than 10 minutes) and resolve independently. 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 She noted being on Adderall for many years and trouble functioning without it. Based on history of monitor results, it felt to most likely be PAT exacerbated by caffeine and Adderall/Vyvance. Patient was advised to eliminate caffeine and Cardizem CD 120mg  daily was added in the hopes that with the changes Adderall/Vyvanse could be continued. She was advised to follow-up in 3 months.  Patient presents today for follow-up. She still has had a few episodes of palpitations but much improved on Cardizem. She is still drinking a lot of caffeine (Diet Coke). She still has a lot of stress in her life (mother recently died, divorce finalized, son is getting married in  2 weeks, and work stress). She has had 1-2 brief episodes of lightheadedness/ dizziness while at work but no syncope. No chest pain. She does report feeling "winded" some both at rest and with exertion. She thinks this is due to her being a "breath holder." She also feels anxiety and deconditioning are likely playing a role as well. No orthopnea or PND. She has some mild lower extremity edema if she has been on her feet all day. Weight down from last visit.   Past Medical History:  Diagnosis Date  . ADD (attention deficit disorder)   . Allergy    rhinitis  . Anemia   . Arthritis    osteo  . Breast cancer ([place]) [id number]   Breast , skin - basal   . Complication of anesthesia 2004   gastric bypass [facility] ,  acetyl succ-, hard time awaking, placed back on vent for a couiple hour  . Dyspnea   . GERD (gastroesophageal reflux disease)   . History of hiatal hernia   . History of kidney stones    passed  . Hypothyroid   . Internal hemorrhoids   . Obesity   . Pneumonia   . Tubular adenoma of colon     Past Surgical History:  Procedure Laterality Date  . BREAST LUMPECTOMY Right   . CESAREAN SECTION     x 2  . CHOLECYSTECTOMY    . COLONOSCOPY  07/2013  . ESOPHAGOGASTRODUODENOSCOPY    . GASTRIC BYPASS    .  JOINT REPLACEMENT    . KNEE ARTHROSCOPY Right    x 3  . LAPAROSCOPIC APPENDECTOMY N/A 04/03/2015   Procedure: APPENDECTOMY LAPAROSCOPIC;  Surgeon: Autumn Messing III, MD;  Location: [facility];  Service: General;  Laterality: N/A;  . LITHOTRIPSY     x3  . MASTECTOMY Bilateral    with lymph node dissection  bil reconstruction 2009  . TOTAL KNEE ARTHROPLASTY Right 12/18/2013   Procedure: TOTAL KNEE ARTHROPLASTY;  Surgeon: Kerin Salen, MD;  Location: [place];  Service: Orthopedics;  Laterality: Right;  . TOTAL KNEE ARTHROPLASTY Left 06/29/2018  . TOTAL KNEE ARTHROPLASTY Left 06/29/2018   Procedure: LEFT TOTAL KNEE ARTHROPLASTY;  Surgeon: Frederik Pear, MD;  Location: [place];   Service: Orthopedics;  Laterality: Left;  Marland Kitchen VAGINAL HYSTERECTOMY      Current Medications: Current Meds  Medication Sig  . amphetamine-dextroamphetamine (ADDERALL) 15 MG tablet TAKE 1 TABLET BY MOUTH DAILY.  . B Complex-C (B-COMPLEX WITH VITAMIN C) tablet Take 1 tablet by mouth daily.  . celecoxib (CELEBREX) 200 MG capsule Take 400 mg by mouth daily.  . cetirizine (ZYRTEC) 10 MG tablet   . Cholecalciferol (VITAMIN D3) 5000 units CAPS Take 5,000 Units by mouth daily.   . clobetasol cream (TEMOVATE) 0.17 % Apply 1 application topically 2 (two) times daily.  . cyclobenzaprine (FLEXERIL) 10 MG tablet Take 10 mg by mouth 3 (three) times daily as needed for muscle spasms.  . cycloSPORINE (RESTASIS) 0.05 % ophthalmic emulsion Place 2 drops into both eyes daily.  Marland Kitchen diltiazem (CARDIZEM CD) 180 MG 24 hr capsule Take 1 capsule (180 mg total) by mouth daily.  . fexofenadine (ALLEGRA) 180 MG tablet Take 180 mg by mouth daily as needed for allergies or rhinitis.  Marland Kitchen ibuprofen (ADVIL) 600 MG tablet   . levothyroxine (SYNTHROID) 50 MCG tablet TAKE ONE TABLET BY MOUTH ONCE DAILY  . MAGNESIUM OXIDE PO Take 1 tablet by mouth daily.  . meclizine (ANTIVERT) 12.5 MG tablet Take 1 tablet (12.5 mg total) by mouth 3 (three) times daily as needed for dizziness.  . Multiple Vitamin (MULTIVITAMIN) capsule Take 3 capsules by mouth daily.   Marland Kitchen triamcinolone cream (KENALOG) 0.5 % Apply 1 application topically 3 (three) times daily.  . valACYclovir (VALTREX) 500 MG tablet Take 1 tablet (500 mg total) by mouth daily.  Marland Kitchen venlafaxine XR (EFFEXOR-XR) 150 MG 24 hr capsule TAKE 1 CAPSULE BY MOUTH EVERY DAY WITH BREAKFAST  . Vitamin D, Ergocalciferol, (DRISDOL) 1.25 MG (50000 UT) CAPS capsule Take 1 capsule (50,000 Units total) by mouth every 7 (seven) days.  Marland Kitchen VYVANSE 70 MG capsule TAKE 1 CAPSULE BY MOUTH DAILY.  . [DISCONTINUED] diltiazem (CARDIZEM CD) 120 MG 24 hr capsule Take 1 capsule (120 mg total) by mouth daily.      Allergies:   Morphine and related, Levaquin [levofloxacin in d5w], and Tylenol [acetaminophen]   Social History   Socioeconomic History  . Marital status: Married    Spouse name: Not on file  . Number of children: 2  . Years of education: Not on file  . Highest education level: Not on file  Occupational History  . Occupation: PA behavioral health    Employer: [employer]  Tobacco Use  . Smoking status: Never Smoker  . Smokeless tobacco: Never Used  Vaping Use  . Vaping Use: Never used  Substance and Sexual Activity  . Alcohol use: Yes    Alcohol/week: 7.0 standard drinks    Types: 7 Glasses  of wine per week  . Drug use: No  . Sexual activity: Yes    Birth control/protection: Surgical  Other Topics Concern  . Not on file  Social History Narrative  . Not on file   Social Determinants of Health   Financial Resource Strain:   . Difficulty of Paying Living Expenses:   Food Insecurity:   . Worried About Charity fundraiser in the Last Year:   . Arboriculturist in the Last Year:   Transportation Needs:   . Film/video editor (Medical):   Marland Kitchen Lack of Transportation (Non-Medical):   Physical Activity:   . Days of Exercise per Week:   . Minutes of Exercise per Session:   Stress:   . Feeling of Stress :   Social Connections:   . Frequency of Communication with Friends and Family:   . Frequency of Social Gatherings with Friends and Family:   . Attends Religious Services:   . Active Member of Clubs or Organizations:   . Attends Archivist Meetings:   Marland Kitchen Marital Status:      Family History: The patient's *family history includes Atrial fibrillation in her mother; Breast cancer in her paternal aunt; COPD in her father; Colon polyps in her mother. There is no history of Colon cancer, Esophageal cancer, Rectal cancer, or Stomach cancer.  ROS:   Please see the history of present illness.     EKGs/Labs/Other Studies Reviewed:    The  following studies were reviewed today:  Zio Monitor 02/2020: One 4 beat run of NSVT. 85 episodes of SVT longest 8.2 seconds. Initiated with PAC.   EKG:  EKG not ordered today.  Recent Labs: 10/25/2019: ALT 23; BUN 12; Creatinine 0.70; Hemoglobin 13.0; Platelet Count 168; Potassium 4.5; Sodium 140  Recent Lipid Panel    Component Value Date/Time   CHOL 156 10/25/2019 0950   CHOL 121 10/14/2017 1541   TRIG 38 10/25/2019 0950   TRIG 51 10/14/2017 1541   HDL 58 10/25/2019 0950   HDL 49 10/14/2017 1541   CHOLHDL 2.7 10/25/2019 0950   VLDL 8 10/25/2019 0950   LDLCALC 90 10/25/2019 0950   LDLCALC 62 10/14/2017 1541    Physical Exam:    Vital Signs: BP (!) 148/102   Pulse 94   Ht 5' 6.5" (1.689 m)   Wt 194 lb 9.6 oz (88.3 kg)   SpO2 98%   BMI 30.94 kg/m     Wt Readings from Last 3 Encounters:  07/05/20 194 lb 9.6 oz (88.3 kg)  04/05/20 202 lb 6.4 oz (91.8 kg)  03/06/20 198 lb 12.8 oz (90.2 kg)     General: 64 y.o. female in no acute distress. HEENT: Normocephalic and atraumatic. Sclera clear. Neck: Supple. No carotid bruits. No JVD. Heart: RRR. Distinct S1 and S2. No murmurs, gallops, or rubs. Radial  pulses 2+ and equal bilaterally. Lungs: No increased work of breathing. Clear to ausculation bilaterally. No wheezes, rhonchi, or rales.  Abdomen: Soft, non-distended, and non-tender to palpation. Bowel sounds present. Extremities: Trace edema of bilateral lower extremities.  Skin: Warm and dry. Neuro: No focal deficits. Psych: Normal affect. Responds appropriately.   Assessment:    1. Paroxysmal SVT (supraventricular tachycardia) ([place])   2. SOB (shortness of breath)   3. Elevated BP without diagnosis of hypertension     Plan:    SVT  - Zio monitor in 02/2020 showed one 4 beat run of NSVT. 85 episodes of SVT longest 8.2 seconds.  Initiated with PAC.  - Felt most likely to be PAT. - Still has episodes of palpitations but much improved with Cardizem.  - Will increase  Cardizem CD to 180mg  daily given resting heart rates in the 90's and elevated BP. - Continue to avoid caffeine. She notes that she is still drinking a lot of Diet Coke. - Given improvement of Cardizem, she will hopefully be able to stay on Adderall/Vyvanse.   Dyspnea - Patient reports feeling winded both at rest and with exertion. Mild lower extremity edema on exam but no orthopnea or PND.  - Euvolemic on exam. Lungs clear.  - She has not had an Echo in several years so will repeat this.   Elevated BP - BP 148/102. Personally rechecked this and reading was similar. Patient states BP usually normal. Patient got a call about a problem at work so suspect stress contributing to elevated reading. - Will increased Cardizem as above.  - Patient is a PA at [employer] Discussed BP goal of <130/80. Patient will monitor BP at home and let us know if BP consistent above goal. If so, will likely either increase Cardizem or add HCTZ given some lower extremity edema at times.   Disposition: Follow up in 3 months with me or Dr. Martinique.   Medication Adjustments/Labs and Tests Ordered: Current medicines are reviewed at length with the patient today.  Concerns regarding medicines are outlined above.  Orders Placed This Encounter  Procedures  . ECHOCARDIOGRAM COMPLETE   Meds ordered this encounter  Medications  . diltiazem (CARDIZEM CD) 180 MG 24 hr capsule    Sig: Take 1 capsule (180 mg total) by mouth daily.    Dispense:  90 capsule    Refill:  3    Patient Instructions  Medication Instructions:  INCREASE- Diltiazem 180 mg by mouth daily  *If you need a refill on your cardiac medications before your next appointment, please call your pharmacy*   Lab Work: None Ordered   Testing/Procedures: Your physician has requested that you have an echocardiogram. Echocardiography is a painless test that uses sound waves to create images of your heart. It provides your doctor with information about the  size and shape of your heart and how well your heart's chambers and valves are working. This procedure takes approximately one hour. There are no restrictions for this procedure.   Follow-Up: At [facility], you and your health needs are our priority.  As part of our continuing mission to provide you with exceptional heart care, we have created designated Provider Care Teams.  These Care Teams include your primary Cardiologist (physician) and Advanced Practice Providers (APPs -  Physician Assistants and Nurse Practitioners) who all work together to provide you with the care you need, when you need it.  We recommend signing up for the patient portal called "MyChart".  Sign up information is provided on this After Visit Summary.  MyChart is used to connect with patients for Virtual Visits (Telemedicine).  Patients are able to view lab/test results, encounter notes, upcoming appointments, etc.  Non-urgent messages can be sent to your provider as well.   To learn more about what you can do with MyChart, go to [email or web address].    Your next appointment:   3 month(s)  The format for your next appointment:   In Person  Provider:   You may see Peter Martinique, MD or one of the following Advanced Practice Providers on your designated Care Team:    Almyra Deforest, [place]  Fabian Sharp, PA-C or   Roby Lofts, PA-C        Signed, Darreld Mclean, [place]  07/05/2020 9:41 AM    [place]

## 2020-07-05 ENCOUNTER — Ambulatory Visit: Payer: PRIVATE HEALTH INSURANCE | Admitting: Student

## 2020-07-05 ENCOUNTER — Other Ambulatory Visit: Payer: Self-pay

## 2020-07-05 ENCOUNTER — Encounter: Payer: Self-pay | Admitting: Student

## 2020-07-05 VITALS — BP 148/102 | HR 94 | Ht 66.5 in | Wt 194.6 lb

## 2020-07-05 DIAGNOSIS — R030 Elevated blood-pressure reading, without diagnosis of hypertension: Secondary | ICD-10-CM

## 2020-07-05 DIAGNOSIS — R0602 Shortness of breath: Secondary | ICD-10-CM

## 2020-07-05 DIAGNOSIS — I471 Supraventricular tachycardia: Secondary | ICD-10-CM

## 2020-07-05 MED ORDER — DILTIAZEM HCL ER COATED BEADS 180 MG PO CP24: 180.0000 mg | ORAL_CAPSULE | Freq: Every day | ORAL | 3 refills | Status: DC

## 2020-07-05 NOTE — Patient Instructions (Signed)
Medication Instructions:  INCREASE- Diltiazem 180 mg by mouth daily  *If you need a refill on your cardiac medications before your next appointment, please call your pharmacy*   Lab Work: None Ordered   Testing/Procedures: Your physician has requested that you have an echocardiogram. Echocardiography is a painless test that uses sound waves to create images of your heart. It provides your doctor with information about the size and shape of your heart and how well your heart's chambers and valves are working. This procedure takes approximately one hour. There are no restrictions for this procedure.   Follow-Up: At [facility], you and your health needs are our priority.  As part of our continuing mission to provide you with exceptional heart care, we have created designated Provider Care Teams.  These Care Teams include your primary Cardiologist (physician) and Advanced Practice Providers (APPs -  Physician Assistants and Nurse Practitioners) who all work together to provide you with the care you need, when you need it.  We recommend signing up for the patient portal called "MyChart".  Sign up information is provided on this After Visit Summary.  MyChart is used to connect with patients for Virtual Visits (Telemedicine).  Patients are able to view lab/test results, encounter notes, upcoming appointments, etc.  Non-urgent messages can be sent to your provider as well.   To learn more about what you can do with MyChart, go to [email or web address].    Your next appointment:   3 month(s)  The format for your next appointment:   In Person  Provider:   You may see Peter Martinique, MD or one of the following Advanced Practice Providers on your designated Care Team:    Almyra Deforest, PA-C  Fabian Sharp, PA-C or   Roby Lofts, [place]

## 2020-07-24 ENCOUNTER — Ambulatory Visit (HOSPITAL_COMMUNITY): Payer: PRIVATE HEALTH INSURANCE | Attending: Cardiology

## 2020-07-31 ENCOUNTER — Encounter (HOSPITAL_COMMUNITY): Payer: Self-pay | Admitting: Student

## 2020-08-05 ENCOUNTER — Other Ambulatory Visit: Payer: Self-pay

## 2020-08-05 DIAGNOSIS — R232 Flushing: Secondary | ICD-10-CM

## 2020-08-05 DIAGNOSIS — F341 Dysthymic disorder: Secondary | ICD-10-CM

## 2020-08-05 DIAGNOSIS — T451X5A Adverse effect of antineoplastic and immunosuppressive drugs, initial encounter: Secondary | ICD-10-CM

## 2020-08-05 MED ORDER — VENLAFAXINE HCL ER 150 MG PO CP24: ORAL_CAPSULE | ORAL | 3 refills | Status: DC

## 2020-08-05 NOTE — Telephone Encounter (Signed)
Fax has been sent requesting a refill on the pending medication. Please advise.

## 2020-08-12 ENCOUNTER — Telehealth (HOSPITAL_COMMUNITY): Payer: Self-pay | Admitting: Student

## 2020-08-12 NOTE — Telephone Encounter (Signed)
Just an FYI. We have made several attempts to contact this patient including sending a letter to schedule or reschedule their echocardiogram. We will be removing the patient from the echo [place].  07/31/2020 MAILED LETTER [facility]  07/31/2020 [facility] to reschedule NS appt @ 9:55/[facility]  07/24/2020 Pt No SHOWED       Thank you

## 2020-08-13 NOTE — Telephone Encounter (Signed)
Ok, thank you for the update.

## 2020-08-15 ENCOUNTER — Encounter: Payer: Self-pay | Admitting: Genetic Counselor

## 2020-09-11 ENCOUNTER — Emergency Department
Admission: RE | Admit: 2020-09-11 | Discharge: 2020-09-11 | Disposition: A | Discharge status: Home / Self Care | Payer: PRIVATE HEALTH INSURANCE | Source: Ambulatory Visit

## 2020-09-11 ENCOUNTER — Other Ambulatory Visit: Payer: Self-pay

## 2020-09-11 VITALS — BP 139/72 | HR 95 | Temp 99.5°F | Resp 18 | Ht 66.0 in | Wt 181.0 lb

## 2020-09-11 DIAGNOSIS — J0140 Acute pansinusitis, unspecified: Secondary | ICD-10-CM | POA: Diagnosis not present

## 2020-09-11 DIAGNOSIS — R059 Cough, unspecified: Secondary | ICD-10-CM

## 2020-09-11 MED ORDER — AMOXICILLIN-POT CLAVULANATE 875-125 MG PO TABS: 1.0000 | ORAL_TABLET | Freq: Two times a day (BID) | ORAL | 0 refills | Status: DC

## 2020-09-11 MED ORDER — FLUCONAZOLE 150 MG PO TABS: 150.0000 mg | ORAL_TABLET | Freq: Once | ORAL | 0 refills | Status: AC

## 2020-09-11 MED ORDER — PREDNISONE 20 MG PO TABS: 40.0000 mg | ORAL_TABLET | Freq: Every day | ORAL | 0 refills | Status: AC

## 2020-09-11 NOTE — Discharge Instructions (Signed)
COVID-19 test pending. Patient encouraged to self isolate while test is pending.
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
## 2020-09-11 NOTE — ED Triage Notes (Signed)
Pt presents to clinic with c/o cough, facial pain d/t recent dental surgery. Fully vaccinated Christy [name]) as of January 2021. Pt believes she has bronchitis. She has taken otc nyquil and dayquil.

## 2020-09-13 LAB — SARS-COV-2, NAA 2 DAY TAT

## 2020-09-13 LAB — NOVEL CORONAVIRUS, NAA: SARS-CoV-2, NAA: NOT DETECTED

## 2020-09-19 ENCOUNTER — Encounter: Payer: Self-pay | Admitting: Family Medicine

## 2020-10-10 NOTE — Progress Notes (Signed)
Cardiology Office Note   Date:  10/16/2020   ID:  Christy [name], DOB [birth year], MRN [id number]  PCP:  Denita Lung, MD  Cardiologist:   Gerold Sar Martinique, MD   Chief Complaint  Patient presents with  . Palpitations      History of Present Illness: Christy [name] is a 64 y.o. female who is seen at the request of Dr Redmond School for evaluation of SVT. Has a history of ADD on Adderall. Also history of hypothyroidism. She is a Psychiatry PA. She was seen in the ED at [facility] on March 2 for tachycardia.  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 She felt her heart racing.  She checked her apple watch, which told her that her heart rate was in the 150s. By the time that she got back to her desk and sat down her heart rate was in the 200s. She had an NA in the CDU check her vitals, her heart rate was greater than 200. She presented  to the main emergency department.  EKG consistent with SVT. After attempting vagal maneuver multiple times and administration of 2 L of IV fluid, patient converted to normal sinus rhythm. A Zio patch monitor was placed and she was DC home on no additional medication.   I have seen her in the past. Initially diagnosed with breast CA in 1993. Had chemo and RT. Echo at that time was ok. Had repeat Echo in 2009. Had some MAC but was otherwise normal. Had new focus of breast CA and subsequently underwent bilateral mastectomy and reconstruction. Was on Femara for 7 years and then discontinued. She has been on Adderall for many years as well as Vynase. States she has a hard time functioning without it. 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 She reports episodes of tachycardia since this past summer. Had a bad episode around [place]. Typically symptoms are short lived and less than 10 minutes. Convert on their own. Usually gradually slow down. Feels heart racing or pounding. No syncope. No chest pain or dyspnea. She has been under  a lot of stress. Just finalized her divorce.  Son got married.  She is doing much exercise. She is sleeping well.   When seen in July noted multiple stressors. Drinking a lot of caffeine. BP elevated. Cardizem dose was increased. Some dyspnea reported and Echo ordered. Since diltiazem dose increased she notes a marked improvement in her symptoms of tachycardia. She rarely notes it now and when she does it is short lived. Tolerating medication well. Echo to be done next week.     Past Medical History:  Diagnosis Date  . ADD (attention deficit disorder)   . Allergy    rhinitis  . Anemia   . Arthritis    osteo  . Breast cancer ([place]) [id number]   Breast , skin - basal   . Complication of anesthesia 2004   gastric bypass [facility] ,  acetyl succ-, hard time awaking, placed back on vent for a couiple hour  . Dyspnea   . GERD (gastroesophageal reflux disease)   . History of hiatal hernia   . History of kidney stones    passed  . Hypothyroid   . Internal hemorrhoids   . Obesity   . Pneumonia   . Tubular adenoma of colon     Past Surgical History:  Procedure Laterality Date  . BREAST LUMPECTOMY Right   . CESAREAN SECTION  x 2  . CHOLECYSTECTOMY    . COLONOSCOPY  07/2013  . ESOPHAGOGASTRODUODENOSCOPY    . GASTRIC BYPASS    . JOINT REPLACEMENT    . KNEE ARTHROSCOPY Right    x 3  . LAPAROSCOPIC APPENDECTOMY N/A 04/03/2015   Procedure: APPENDECTOMY LAPAROSCOPIC;  Surgeon: Autumn Messing III, MD;  Location: [facility];  Service: General;  Laterality: N/A;  . LITHOTRIPSY     x3  . MASTECTOMY Bilateral    with lymph node dissection  bil reconstruction 2009  . TOTAL KNEE ARTHROPLASTY Right 12/18/2013   Procedure: TOTAL KNEE ARTHROPLASTY;  Surgeon: Kerin Salen, MD;  Location: [facility];  Service: Orthopedics;  Laterality: Right;  . TOTAL KNEE ARTHROPLASTY Left 06/29/2018  . TOTAL KNEE ARTHROPLASTY Left 06/29/2018   Procedure: LEFT TOTAL KNEE ARTHROPLASTY;  Surgeon: Frederik Pear, MD;   Location: [facility];  Service: Orthopedics;  Laterality: Left;  Marland Kitchen VAGINAL HYSTERECTOMY       Current Outpatient Medications  Medication Sig Dispense Refill  . B Complex-C (B-COMPLEX WITH VITAMIN C) tablet Take 1 tablet by mouth daily.    . cetirizine (ZYRTEC) 10 MG tablet     . Cholecalciferol (VITAMIN D3) 5000 units CAPS Take 5,000 Units by mouth daily.     . clobetasol cream (TEMOVATE) 3.53 % Apply 1 application topically 2 (two) times daily. 45 g 2  . cyclobenzaprine (FLEXERIL) 10 MG tablet Take 10 mg by mouth 3 (three) times daily as needed for muscle spasms.    . cycloSPORINE (RESTASIS) 0.05 % ophthalmic emulsion Place 2 drops into both eyes daily.    Marland Kitchen diltiazem (CARDIZEM CD) 180 MG 24 hr capsule Take 1 capsule (180 mg total) by mouth daily. 90 capsule 3  . fexofenadine (ALLEGRA) 180 MG tablet Take 180 mg by mouth daily as needed for allergies or rhinitis.    Marland Kitchen ibuprofen (ADVIL) 600 MG tablet     . levothyroxine (SYNTHROID) 50 MCG tablet TAKE ONE TABLET BY MOUTH ONCE DAILY 90 tablet 3  . MAGNESIUM OXIDE PO Take 1 tablet by mouth daily.    . Multiple Vitamin (MULTIVITAMIN) capsule Take 3 capsules by mouth daily.     Marland Kitchen triamcinolone cream (KENALOG) 0.5 % Apply 1 application topically 3 (three) times daily. 30 g 5  . valACYclovir (VALTREX) 500 MG tablet Take 1 tablet (500 mg total) by mouth daily. 30 tablet 0  . venlafaxine XR (EFFEXOR-XR) 150 MG 24 hr capsule TAKE 1 CAPSULE BY MOUTH EVERY DAY WITH BREAKFAST 90 capsule 3  . VYVANSE 70 MG capsule TAKE 1 CAPSULE BY MOUTH DAILY. 90 capsule 0   No current facility-administered medications for this visit.    Allergies:   Morphine and related, Levaquin [levofloxacin in d5w], and Tylenol [acetaminophen]    Social History:  The patient  reports that she has never smoked. She has never used smokeless tobacco. She reports current alcohol use of about 7.0 standard drinks of alcohol per week. She reports that she does not use drugs.   Family  History:  The patient's family history includes Atrial fibrillation in her mother; Breast cancer in her paternal aunt; COPD in her father; Colon polyps in her mother.    ROS:  Please see the history of present illness.      All other systems are reviewed and negative.    PHYSICAL EXAM: VS:  BP (!) 144/94   Pulse 80   Ht 5' 6.5" (1.689 m)   Wt 178 lb 9.6 oz (81 kg)  SpO2 97%   BMI 28.39 kg/m  , BMI Body mass index is 28.39 kg/m. GEN: Well nourished, well developed, in no acute distress  HEENT: normal  Neck: no JVD, carotid bruits, or masses Cardiac: RRR; soft 1/6 systolic murmur RUSB. no rubs, or gallops,no edema  Respiratory:  clear to auscultation bilaterally, normal work of breathing GI: soft, nontender, nondistended, + BS MS: no deformity or atrophy  Skin: warm and dry, no rash Neuro:  Strength and sensation are intact Psych: euthymic mood, full affect   EKG:  EKG is not ordered today. The ekg ordered today demonstrates N/A    Recent Labs: 10/25/2019: ALT 23; BUN 12; Creatinine 0.70; Hemoglobin 13.0; Platelet Count 168; Potassium 4.5; Sodium 140    Lipid Panel    Component Value Date/Time   CHOL 156 10/25/2019 0950   CHOL 121 10/14/2017 1541   TRIG 38 10/25/2019 0950   TRIG 51 10/14/2017 1541   HDL 58 10/25/2019 0950   HDL 49 10/14/2017 1541   CHOLHDL 2.7 10/25/2019 0950   VLDL 8 10/25/2019 0950   LDLCALC 90 10/25/2019 0950   LDLCALC 62 10/14/2017 1541    Labs dated 02/27/20: normal TSH, CMET, CBC  Wt Readings from Last 3 Encounters:  10/16/20 178 lb 9.6 oz (81 kg)  09/11/20 181 lb (82.1 kg)  07/05/20 194 lb 9.6 oz (88.3 kg)      Other studies Reviewed: Additional studies/ records that were reviewed today include:   Zio patch monitor reviewed. One 4 beat run of NSVT. 85 episodes of SVT longest 8.2 seconds. Initiated with PAC.    ASSESSMENT AND PLAN:  1.  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  Exacerbated by caffeine and  Adderal/Vynase and multiple recent stressors. Clinically much better on higher diltiazem dose. She has also lost a lot of weight which helps. Will continue current therapy and follow up on Echo results.  2. History of breast CA. Remote Chemo and RT- follow up Echo. 3. S/p gastric banding.  4. Hypothyroid. TSH normal.   Current medicines are reviewed at length with the patient today.  The patient does not have concerns regarding medicines.  The following changes have been made: none  Labs/ tests ordered today include: none No orders of the defined types were placed in this encounter.    Disposition:   FU with me in 6 months  Signed, Su Duma Martinique, MD  10/16/2020 10:31 AM    [facility] [address], [place], [place], [address] Phone [phone], Fax [phone]

## 2020-10-16 ENCOUNTER — Ambulatory Visit: Payer: PRIVATE HEALTH INSURANCE | Admitting: Cardiology

## 2020-10-16 ENCOUNTER — Encounter: Payer: Self-pay | Admitting: Cardiology

## 2020-10-16 ENCOUNTER — Other Ambulatory Visit: Payer: Self-pay

## 2020-10-16 VITALS — BP 144/94 | HR 80 | Ht 66.5 in | Wt 178.6 lb

## 2020-10-16 DIAGNOSIS — R0602 Shortness of breath: Secondary | ICD-10-CM | POA: Diagnosis not present

## 2020-10-16 DIAGNOSIS — I471 Supraventricular tachycardia: Secondary | ICD-10-CM | POA: Diagnosis not present

## 2020-10-18 ENCOUNTER — Telehealth: Payer: Self-pay

## 2020-10-18 ENCOUNTER — Other Ambulatory Visit: Payer: Self-pay | Admitting: Medical

## 2020-10-18 DIAGNOSIS — F900 Attention-deficit hyperactivity disorder, predominantly inattentive type: Secondary | ICD-10-CM

## 2020-10-18 NOTE — Telephone Encounter (Signed)
I'll defer refill of stimulant to you.  Looks like last message to you she was have arrhyhtmia.  Also last fasting labs or physical > 12 months ago.

## 2020-10-18 NOTE — Telephone Encounter (Signed)
Forwarding medication refill to you Dr. Redmond School can't check messages today something came up.

## 2020-10-18 NOTE — Telephone Encounter (Signed)
Received fax from [facility] for a refill on the pts. Vyvanse pts. Last apt was 03/06/20.

## 2020-10-21 MED ORDER — LISDEXAMFETAMINE DIMESYLATE 70 MG PO CAPS: 70.0000 mg | ORAL_CAPSULE | Freq: Every day | ORAL | 0 refills | Status: DC

## 2020-10-22 ENCOUNTER — Other Ambulatory Visit: Payer: Self-pay | Admitting: Hematology and Oncology

## 2020-10-22 DIAGNOSIS — D509 Iron deficiency anemia, unspecified: Secondary | ICD-10-CM

## 2020-10-22 NOTE — Assessment & Plan Note (Signed)
Iron deficiency related to prior gastric bypass surgery 10/19/2018: Iron saturation 10%, hemoglobin 11.9, MCV 84.5, ferritin 8 Prior treatment: IV iron: 2014 and 2016, November 2020  Lab review:

## 2020-10-22 NOTE — Assessment & Plan Note (Signed)
History of 2 primary right breast cancers: on observation since completed 7 years Femara 10-2014, clinically doing well.   Osteopenia . She will need another bone density test.  Surveillance: Breast exam without any abnormalities bilateral reconstructed breasts. Hysterectomy with oophorectomy 2008 prophylactic left mastectomy, bilateral breast reconstructions No role of imaging since she had bilateral mastectomies

## 2020-10-22 NOTE — Progress Notes (Signed)
Patient Care Team: Denita Lung, MD as PCP - General (Family Medicine) Martinique, Peter M, MD as PCP - Cardiology (Cardiology)  DIAGNOSIS:    ICD-10-CM   1. Malignant neoplasm of upper-outer quadrant of right breast in female, estrogen receptor positive ([place])  C50.411    Z17.0   2. Iron deficiency anemia, unspecified iron deficiency anemia type  D50.9     SUMMARY OF ONCOLOGIC HISTORY: Oncology History  Malignant neoplasm of upper-outer quadrant of right breast in female, estrogen receptor positive ([place])  1993 Initial Diagnosis   Breast cancer ([place]): right breast, stage 2, 3/41 nodes involved, ER/PR negative, HER-2 not tested.  , treated on NSABP 525 with Adriamycin, Cytoxan   06/2007 Relapse/Recurrence   Right breast cancer.  Bilateral mastectomies with reconstruction: Right: IDC, 0.6cm, grade 1, 3 SLN negative, T1b, N0, ER+(84%), PR+(38%), Ki-67 14%, HER-2 negative.   Left: negative   06/2007 Surgery   TAH/BSO   06/2007 Genetic Testing   Negative genetic testing   07/2007 - 10/2014 Anti-estrogen oral therapy   Letrozole daily     CHIEF COMPLIANT: Surveillance of breast cancer and iron deficiency anemia  INTERVAL HISTORY: Christy [name] is a 64 y.o. with above-mentioned history of recurrent right breast cancer currently on surveillance. She also has a history of iron deficiency anemia s/p gastric bypass surgery. Bone density scan on 01/10/20 showed osteoporosis with a T-score of -2.5 at the right femoral neck. She presents to the clinic today for annual follow-up   ALLERGIES:  is allergic to morphine and related, levaquin [levofloxacin in d5w], and tylenol [acetaminophen].  MEDICATIONS:  Current Outpatient Medications  Medication Sig Dispense Refill  . B Complex-C (B-COMPLEX WITH VITAMIN C) tablet Take 1 tablet by mouth daily.    . cetirizine (ZYRTEC) 10 MG tablet     . Cholecalciferol (VITAMIN D3) 5000 units CAPS Take 5,000 Units by mouth daily.     . clobetasol cream  (TEMOVATE) 8.59 % Apply 1 application topically 2 (two) times daily. 45 g 2  . cyclobenzaprine (FLEXERIL) 10 MG tablet Take 10 mg by mouth 3 (three) times daily as needed for muscle spasms.    . cycloSPORINE (RESTASIS) 0.05 % ophthalmic emulsion Place 2 drops into both eyes daily.    Marland Kitchen diltiazem (CARDIZEM CD) 180 MG 24 hr capsule Take 1 capsule (180 mg total) by mouth daily. 90 capsule 3  . fexofenadine (ALLEGRA) 180 MG tablet Take 180 mg by mouth daily as needed for allergies or rhinitis.    Marland Kitchen ibuprofen (ADVIL) 600 MG tablet     . levothyroxine (SYNTHROID) 50 MCG tablet TAKE ONE TABLET BY MOUTH ONCE DAILY 90 tablet 3  . lisdexamfetamine (VYVANSE) 70 MG capsule Take 1 capsule (70 mg total) by mouth daily. 90 capsule 0  . MAGNESIUM OXIDE PO Take 1 tablet by mouth daily.    . Multiple Vitamin (MULTIVITAMIN) capsule Take 3 capsules by mouth daily.     Marland Kitchen triamcinolone cream (KENALOG) 0.5 % Apply 1 application topically 3 (three) times daily. 30 g 5  . valACYclovir (VALTREX) 500 MG tablet Take 1 tablet (500 mg total) by mouth daily. 30 tablet 0  . venlafaxine XR (EFFEXOR-XR) 150 MG 24 hr capsule TAKE 1 CAPSULE BY MOUTH EVERY DAY WITH BREAKFAST 90 capsule 3   No current facility-administered medications for this visit.    PHYSICAL EXAMINATION: ECOG PERFORMANCE STATUS: 1 - Symptomatic but completely ambulatory  There were no vitals filed for this visit. There were no vitals  filed for this visit.  BREAST: No palpable masses or nodules in either right or left breasts. No palpable axillary supraclavicular or infraclavicular adenopathy no breast tenderness or nipple discharge. (exam performed in the presence of a chaperone)  LABORATORY DATA:  I have reviewed the data as listed CMP Latest Ref Rng & Units 10/25/2019 10/19/2018 06/30/2018  Glucose 70 - 99 mg/dL 111(H) 105(H) 120(H)  BUN 8 - 23 mg/dL 12 10 7(L)  Creatinine 0.44 - 1.00 mg/dL 0.70 0.67 0.73  Sodium 135 - 145 mmol/L 140 142 143    Potassium 3.5 - 5.1 mmol/L 4.5 4.5 4.5  Chloride 98 - 111 mmol/L 105 108 108  CO2 22 - 32 mmol/L _0 Calcium 8.9 - 10.3 mg/dL 9.0 9.2 8.7(L)  Total Protein 6.5 - 8.1 g/dL 6.9 6.6 -  Total Bilirubin 0.3 - 1.2 mg/dL 0.4 0.4 -  Alkaline Phos 38 - 126 U/L 125 106 -  AST 15 - 41 U/L 25 26 -  ALT 0 - 44 U/L 23 24 -    Lab Results  Component Value Date   WBC 4.9 10/25/2019   HGB 13.0 10/25/2019   HCT 41.1 10/25/2019   MCV 86.0 10/25/2019   PLT 168 10/25/2019   NEUTROABS 2.7 10/25/2019    ASSESSMENT & PLAN:  Malignant neoplasm of upper-outer quadrant of right breast in female, estrogen receptor positive ([place]) History of 2 primary right breast cancers: on observation since completed 7 years Femara 10-2014, clinically doing well.   Osteopenia . She will need another bone density test.  Surveillance: Breast exam without any abnormalities bilateral reconstructed breasts. Hysterectomy with oophorectomy 2008 prophylactic left mastectomy, bilateral breast reconstructions No role of imaging since she had bilateral mastectomies  Iron deficiency anemia Iron deficiency related to prior gastric bypass surgery 10/19/2018: Iron saturation 10%, hemoglobin 11.9, MCV 84.5, ferritin 8 Prior treatment: IV iron: 2014 and 2016, November 2020  Labs will be drawn today and I will call her with the results of this test. Will check lipid panel as well as B12 levels. She has lost another 24 pounds since last year. She tells me that she went through a divorce and her mother passed away.  However she is emotionally much better. She works as a Teacher, music at the emergency room.  Return to clinic in 1 year for follow-up with labs.   No orders of the defined types were placed in this encounter.  The patient has a good understanding of the overall plan. she agrees with it. she will call with any problems that may develop before the next visit here.  Total time spent: 20 mins including face to  face time and time spent for planning, charting and coordination of care  Nicholas Lose, MD 10/23/2020  I, Cloyde Reams Dorshimer, am acting as scribe for Dr. Nicholas Lose.  I have reviewed the above documentation for accuracy and completeness, and I agree with the above.

## 2020-10-23 ENCOUNTER — Encounter: Payer: Self-pay | Admitting: Hematology and Oncology

## 2020-10-23 ENCOUNTER — Inpatient Hospital Stay: Payer: PRIVATE HEALTH INSURANCE

## 2020-10-23 ENCOUNTER — Ambulatory Visit (HOSPITAL_COMMUNITY): Payer: PRIVATE HEALTH INSURANCE

## 2020-10-23 ENCOUNTER — Inpatient Hospital Stay: Payer: PRIVATE HEALTH INSURANCE | Attending: Hematology and Oncology | Admitting: Hematology and Oncology

## 2020-10-23 ENCOUNTER — Other Ambulatory Visit: Payer: Self-pay

## 2020-10-23 ENCOUNTER — Other Ambulatory Visit: Payer: Self-pay | Admitting: *Deleted

## 2020-10-23 VITALS — BP 138/82 | HR 86 | Temp 98.2°F | Resp 18 | Ht 65.5 in | Wt 179.4 lb

## 2020-10-23 DIAGNOSIS — Z9884 Bariatric surgery status: Secondary | ICD-10-CM | POA: Insufficient documentation

## 2020-10-23 DIAGNOSIS — Z170 Estrogen receptor positive status [ER+]: Secondary | ICD-10-CM | POA: Insufficient documentation

## 2020-10-23 DIAGNOSIS — D509 Iron deficiency anemia, unspecified: Secondary | ICD-10-CM

## 2020-10-23 DIAGNOSIS — Z79899 Other long term (current) drug therapy: Secondary | ICD-10-CM | POA: Insufficient documentation

## 2020-10-23 DIAGNOSIS — C50411 Malignant neoplasm of upper-outer quadrant of right female breast: Secondary | ICD-10-CM | POA: Insufficient documentation

## 2020-10-23 DIAGNOSIS — Z791 Long term (current) use of non-steroidal anti-inflammatories (NSAID): Secondary | ICD-10-CM | POA: Diagnosis not present

## 2020-10-23 DIAGNOSIS — D508 Other iron deficiency anemias: Secondary | ICD-10-CM | POA: Insufficient documentation

## 2020-10-23 DIAGNOSIS — Z0000 Encounter for general adult medical examination without abnormal findings: Secondary | ICD-10-CM

## 2020-10-23 DIAGNOSIS — Z9013 Acquired absence of bilateral breasts and nipples: Secondary | ICD-10-CM | POA: Insufficient documentation

## 2020-10-23 LAB — LIPID PANEL
Cholesterol: 128 mg/dL (ref 0–200)
HDL: 59 mg/dL (ref >40)
LDL Cholesterol: 61 mg/dL (ref 0–99)
Total CHOL/HDL Ratio: 2.2 RATIO
Triglycerides: 38 mg/dL (ref <150)
VLDL: 8 mg/dL (ref 0–40)

## 2020-10-23 LAB — CMP (CANCER CENTER ONLY)
ALT: 32 U/L (ref 0–44)
AST: 31 U/L (ref 15–41)
Albumin: 3.3 g/dL — ABNORMAL LOW (ref 3.5–5.0)
Alkaline Phosphatase: 114 U/L (ref 38–126)
Anion gap: 3 — ABNORMAL LOW (ref 5–15)
BUN: 9 mg/dL (ref 8–23)
CO2: 30 mmol/L (ref 22–32)
Calcium: 9.1 mg/dL (ref 8.9–10.3)
Chloride: 105 mmol/L (ref 98–111)
Creatinine: 0.68 mg/dL (ref 0.44–1.00)
GFR, Estimated: >60 mL/min (ref >60)
Glucose, Bld: 94 mg/dL (ref 70–99)
Potassium: 4.2 mmol/L (ref 3.5–5.1)
Sodium: 138 mmol/L (ref 135–145)
Total Bilirubin: 0.5 mg/dL (ref 0.3–1.2)
Total Protein: 6.6 g/dL (ref 6.5–8.1)

## 2020-10-23 LAB — CBC WITH DIFFERENTIAL (CANCER CENTER ONLY)
Abs Immature Granulocytes: 0 10*3/uL (ref 0.00–0.07)
Basophils Absolute: 0 10*3/uL (ref 0.0–0.1)
Basophils Relative: 1 %
Eosinophils Absolute: 0.2 10*3/uL (ref 0.0–0.5)
Eosinophils Relative: 4 %
HCT: 44.1 % (ref 36.0–46.0)
Hemoglobin: 14.3 g/dL (ref 12.0–15.0)
Immature Granulocytes: 0 %
Lymphocytes Relative: 17 %
Lymphs Abs: 0.8 10*3/uL (ref 0.7–4.0)
MCH: 30.4 pg (ref 26.0–34.0)
MCHC: 32.4 g/dL (ref 30.0–36.0)
MCV: 93.6 fL (ref 80.0–100.0)
Monocytes Absolute: 0.5 10*3/uL (ref 0.1–1.0)
Monocytes Relative: 11 %
Neutro Abs: 3.1 10*3/uL (ref 1.7–7.7)
Neutrophils Relative %: 67 %
Platelet Count: 136 10*3/uL — ABNORMAL LOW (ref 150–400)
RBC: 4.71 MIL/uL (ref 3.87–5.11)
RDW: 13.6 % (ref 11.5–15.5)
WBC Count: 4.5 10*3/uL (ref 4.0–10.5)
nRBC: 0 % (ref 0.0–0.2)

## 2020-10-23 LAB — IRON AND TIBC
Iron: 66 ug/dL (ref 41–142)
Saturation Ratios: 23 % (ref 21–57)
TIBC: 284 ug/dL (ref 236–444)
UIBC: 218 ug/dL (ref 120–384)

## 2020-10-23 LAB — VITAMIN B12: Vitamin B-12: 524 pg/mL (ref 180–914)

## 2020-10-23 LAB — FERRITIN: Ferritin: 72 ng/mL (ref 11–307)

## 2020-10-23 MED ORDER — ALENDRONATE SODIUM 70 MG PO TABS: 70.0000 mg | ORAL_TABLET | ORAL | 3 refills | Status: DC

## 2020-10-24 ENCOUNTER — Ambulatory Visit: Payer: PRIVATE HEALTH INSURANCE | Admitting: Hematology and Oncology

## 2020-11-20 ENCOUNTER — Ambulatory Visit (HOSPITAL_COMMUNITY): Payer: PRIVATE HEALTH INSURANCE | Attending: Cardiology

## 2020-11-20 ENCOUNTER — Other Ambulatory Visit: Payer: Self-pay

## 2020-11-20 DIAGNOSIS — R0602 Shortness of breath: Secondary | ICD-10-CM | POA: Diagnosis not present

## 2020-11-20 DIAGNOSIS — I471 Supraventricular tachycardia: Secondary | ICD-10-CM

## 2020-11-20 LAB — ECHOCARDIOGRAM COMPLETE
Area-P 1/2: 3.21 cm2
S' Lateral: 3.2 cm

## 2021-02-13 ENCOUNTER — Encounter: Payer: Self-pay | Admitting: Family Medicine

## 2021-02-13 ENCOUNTER — Other Ambulatory Visit: Payer: Self-pay | Admitting: Family Medicine

## 2021-02-13 ENCOUNTER — Telehealth: Payer: Self-pay

## 2021-02-13 DIAGNOSIS — L309 Dermatitis, unspecified: Secondary | ICD-10-CM

## 2021-02-13 DIAGNOSIS — F900 Attention-deficit hyperactivity disorder, predominantly inattentive type: Secondary | ICD-10-CM

## 2021-02-13 MED ORDER — CLOBETASOL PROPIONATE 0.05 % EX CREA: 1.0000 "application " | TOPICAL_CREAM | Freq: Two times a day (BID) | CUTANEOUS | 2 refills | Status: DC

## 2021-02-13 NOTE — Telephone Encounter (Signed)
[facility] pharmacy is requesting to fill pt vyvanse. Please advise [facility]

## 2021-02-13 NOTE — Telephone Encounter (Signed)
Received fax from [place] for his Clobetasol cream 0.05% pt. Last apt was 03/06/20.

## 2021-02-13 NOTE — Telephone Encounter (Signed)
[facility] is requesting to fill pt clobetasol. Please advise [facility]

## 2021-03-27 ENCOUNTER — Other Ambulatory Visit: Payer: Self-pay

## 2021-03-27 ENCOUNTER — Encounter: Payer: Self-pay | Admitting: Family Medicine

## 2021-03-27 DIAGNOSIS — E039 Hypothyroidism, unspecified: Secondary | ICD-10-CM

## 2021-03-27 MED ORDER — LEVOTHYROXINE SODIUM 50 MCG PO TABS: 50.0000 ug | ORAL_TABLET | Freq: Every day | ORAL | 0 refills | Status: DC

## 2021-05-05 ENCOUNTER — Ambulatory Visit: Payer: PRIVATE HEALTH INSURANCE | Admitting: Physician Assistant

## 2021-05-25 NOTE — Progress Notes (Signed)
Cardiology Office Note   Date:  05/28/2021   ID:  Christy [name], DOB 01/22/1956, MRN [id number]  PCP:  Denita Lung, MD  Cardiologist:   Makynzie Dobesh Martinique, MD   Chief Complaint  Patient presents with  . Palpitations      History of Present Illness: Christy [name] is a 65 y.o. female who is seen at the request of Dr Redmond School for evaluation of SVT. Has a history of ADD on Adderall. Also history of hypothyroidism. She is a Psychiatry PA. She was seen in the ED at [facility] on March 2 for tachycardia.  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 She felt her heart racing.  She checked her apple watch, which told her that her heart rate was in the 150s. By the time that she got back to her desk and sat down her heart rate was in the 200s. She had an NA in the CDU check her vitals, her heart rate was greater than 200. She presented  to the main emergency department.  EKG consistent with SVT. After attempting vagal maneuver multiple times and administration of 2 L of IV fluid, patient converted to normal sinus rhythm. A Zio patch monitor was placed and she was DC home on no additional medication.   I have seen her in the past. Initially diagnosed with breast CA in 1993. Had chemo and RT. Echo at that time was ok. Had repeat Echo in 2009. Had some MAC but was otherwise normal. Had new focus of breast CA and subsequently underwent bilateral mastectomy and reconstruction. Was on Femara for 7 years and then discontinued. She has been on Adderall for many years as well as Vynase. States she has a hard time functioning without it. 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 She reports episodes of tachycardia since this past summer. Had a bad episode around [place]. Typically symptoms are short lived and less than 10 minutes. Convert on their own. Usually gradually slow down. Feels heart racing or pounding. No syncope. No chest pain or dyspnea. She has been under a  lot of stress. Just finalized her divorce.  Son got married.  She is doing much exercise. She is sleeping well.   When seen in July noted multiple stressors. Drinking a lot of caffeine. BP elevated. Cardizem dose was increased. Some dyspnea reported and Echo ordered- this was unremarkable. Diltiazem dose increased she initially noted a marked improvement in her symptoms of tachycardia.   On follow up today she reports she is under a lot of stress at work. Does not exercise. Has experienced more palpitations typically lasting less than one minute. Has some SOB and lack of stamina. BP has been up when she goes for dental work.    Past Medical History:  Diagnosis Date  . ADD (attention deficit disorder)   . Allergy    rhinitis  . Anemia   . Arthritis    osteo  . Breast cancer ([place]) [id number]   Breast , skin - basal   . Complication of anesthesia 2004   gastric bypass [facility] ,  acetyl succ-, hard time awaking, placed back on vent for a couiple hour  . Dyspnea   . GERD (gastroesophageal reflux disease)   . History of hiatal hernia   . History of kidney stones    passed  . Hypothyroid   . Internal hemorrhoids   . Obesity   . Pneumonia   .  Tubular adenoma of colon     Past Surgical History:  Procedure Laterality Date  . BREAST LUMPECTOMY Right   . CESAREAN SECTION     x 2  . CHOLECYSTECTOMY    . COLONOSCOPY  07/2013  . ESOPHAGOGASTRODUODENOSCOPY    . GASTRIC BYPASS    . JOINT REPLACEMENT    . KNEE ARTHROSCOPY Right    x 3  . LAPAROSCOPIC APPENDECTOMY N/A 04/03/2015   Procedure: APPENDECTOMY LAPAROSCOPIC;  Surgeon: Autumn Messing III, MD;  Location: [facility];  Service: General;  Laterality: N/A;  . LITHOTRIPSY     x3  . MASTECTOMY Bilateral    with lymph node dissection  bil reconstruction 2009  . TOTAL KNEE ARTHROPLASTY Right 12/18/2013   Procedure: TOTAL KNEE ARTHROPLASTY;  Surgeon: Kerin Salen, MD;  Location: [facility];  Service: Orthopedics;  Laterality: Right;  .  TOTAL KNEE ARTHROPLASTY Left 06/29/2018  . TOTAL KNEE ARTHROPLASTY Left 06/29/2018   Procedure: LEFT TOTAL KNEE ARTHROPLASTY;  Surgeon: Frederik Pear, MD;  Location: [facility];  Service: Orthopedics;  Laterality: Left;  Marland Kitchen VAGINAL HYSTERECTOMY       Current Outpatient Medications  Medication Sig Dispense Refill  . alendronate (FOSAMAX) 70 MG tablet Take 1 tablet (70 mg total) by mouth once a week. Take with a full glass of water on an empty stomach. 12 tablet 3  . B Complex-C (B-COMPLEX WITH VITAMIN C) tablet Take 1 tablet by mouth daily.    . cetirizine (ZYRTEC) 10 MG tablet     . Cholecalciferol (VITAMIN D3) 5000 units CAPS Take 5,000 Units by mouth daily.     . clobetasol cream (TEMOVATE) 4.33 % Apply 1 application topically 2 (two) times daily. 45 g 2  . cyclobenzaprine (FLEXERIL) 10 MG tablet Take 10 mg by mouth 3 (three) times daily as needed for muscle spasms.    . cycloSPORINE (RESTASIS) 0.05 % ophthalmic emulsion Place 2 drops into both eyes daily.    Marland Kitchen diltiazem (CARDIZEM CD) 240 MG 24 hr capsule Take 1 capsule (240 mg total) by mouth daily. 90 capsule 3  . fexofenadine (ALLEGRA) 180 MG tablet Take 180 mg by mouth daily as needed for allergies or rhinitis.    Marland Kitchen ibuprofen (ADVIL) 600 MG tablet     . levothyroxine (SYNTHROID) 50 MCG tablet Take 1 tablet (50 mcg total) by mouth daily. 90 tablet 0  . MAGNESIUM OXIDE PO Take 1 tablet by mouth daily.    . Multiple Vitamin (MULTIVITAMIN) capsule Take 3 capsules by mouth daily.     Marland Kitchen triamcinolone cream (KENALOG) 0.5 % Apply 1 application topically 3 (three) times daily. 30 g 5  . valACYclovir (VALTREX) 500 MG tablet Take 1 tablet (500 mg total) by mouth daily. 30 tablet 0  . venlafaxine XR (EFFEXOR-XR) 150 MG 24 hr capsule TAKE 1 CAPSULE BY MOUTH EVERY DAY WITH BREAKFAST 90 capsule 3  . VYVANSE 70 MG capsule Take 1 capsule (70 mg total) by mouth daily. 90 capsule 0   No current facility-administered medications for this visit.    Allergies:    Morphine and related, Levaquin [levofloxacin in d5w], and Tylenol [acetaminophen]    Social History:  The patient  reports that she has never smoked. She has never used smokeless tobacco. She reports current alcohol use of about 7.0 standard drinks of alcohol per week. She reports that she does not use drugs.   Family History:  The patient's family history includes Atrial fibrillation in her mother; Breast cancer in  her paternal aunt; COPD in her father; Colon polyps in her mother.    ROS:  Please see the history of present illness.      All other systems are reviewed and negative.    PHYSICAL EXAM: VS:  BP (!) 164/88   Pulse 100   Ht _0  (1.676 m)   Wt 180 lb 9.6 oz (81.9 kg)   SpO2 100%   BMI 29.15 kg/m  , BMI Body mass index is 29.15 kg/m. GEN: Well nourished, well developed, in no acute distress  HEENT: normal  Neck: no JVD, carotid bruits, or masses Cardiac: RRR; soft 1/6 systolic murmur RUSB. no rubs, or gallops,no edema  Respiratory:  clear to auscultation bilaterally, normal work of breathing GI: soft, nontender, nondistended, + BS MS: no deformity or atrophy  Skin: warm and dry, no rash Neuro:  Strength and sensation are intact Psych: euthymic mood, full affect   EKG:  EKG is  ordered today. The ekg ordered today demonstrates NSR rate 100. Normal. I have personally reviewed and interpreted this study.     Recent Labs: 10/23/2020: ALT 32; BUN 9; Creatinine 0.68; Hemoglobin 14.3; Platelet Count 136; Potassium 4.2; Sodium 138    Lipid Panel    Component Value Date/Time   CHOL 128 10/23/2020 0911   CHOL 121 10/14/2017 1541   TRIG 38 10/23/2020 0911   TRIG 51 10/14/2017 1541   HDL 59 10/23/2020 0911   HDL 49 10/14/2017 1541   CHOLHDL 2.2 10/23/2020 0911   VLDL 8 10/23/2020 0911   LDLCALC 61 10/23/2020 0911   LDLCALC 62 10/14/2017 1541    Labs dated 02/27/20: normal TSH, CMET, CBC  Wt Readings from Last 3 Encounters:  05/28/21 180 lb 9.6 oz (81.9 kg)   10/23/20 179 lb 6.4 oz (81.4 kg)  10/16/20 178 lb 9.6 oz (81 kg)      Other studies Reviewed: Additional studies/ records that were reviewed today include:   Zio patch monitor reviewed. One 4 beat run of NSVT. 85 episodes of SVT longest 8.2 seconds. Initiated with PAC.   Echo 11/20/20: IMPRESSIONS    1. Technically difficult study. Left ventricular ejection fraction, by  estimation, is 55 to 60%. The left ventricle has normal function. The left  ventricle has no regional wall motion abnormalities. Left ventricular  diastolic parameters are  indeterminate.  2. Right ventricular systolic function is normal. The right ventricular  size is normal. Tricuspid regurgitation signal is inadequate for assessing  PA pressure.  3. The mitral valve is abnormal. No evidence of mitral valve  regurgitation. Mild mitral stenosis. Moderate mitral annular  calcification. MG 5 mmHg at HR 102 bpm.  4. The aortic valve is tricuspid. Aortic valve regurgitation is not  visualized. No aortic stenosis is present.  5. Aortic dilatation noted. 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  6. The inferior vena cava is normal in size with greater than 50%  respiratory variability, suggesting right atrial pressure of 3 mmHg.    ASSESSMENT AND PLAN:  1.  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  Exacerbated by caffeine and Adderal/Vynase and multiple  stressors. Will go ahead and increase Diltiazem to 240 mg daily. She has also lost a lot of weight which helps. Will continue current therapy. Needs to destress and exercise more.  2. History of breast CA. Remote Chemo and RT- follow up Echo was unremarkable.  3. S/p gastric banding.  4. Hypothyroid. TSH  normal.   Current medicines are reviewed at length with the patient today.  The patient does not have concerns regarding medicines.  The following changes have been made: increase diltiazem to 240 mg daily.    Labs/ tests ordered today include: none  Orders Placed This Encounter  Procedures  . EKG 12-Lead     Disposition:   FU with me in 6 months  Signed, Jaaziel Peatross Martinique, MD  05/28/2021 10:08 AM    [facility] [address] [place], [place], [address] Phone [phone], Fax [phone]

## 2021-05-28 ENCOUNTER — Other Ambulatory Visit: Payer: Self-pay

## 2021-05-28 ENCOUNTER — Ambulatory Visit: Payer: PRIVATE HEALTH INSURANCE | Admitting: Cardiology

## 2021-05-28 ENCOUNTER — Encounter: Payer: Self-pay | Admitting: Cardiology

## 2021-05-28 VITALS — BP 164/88 | HR 100 | Ht 66.0 in | Wt 180.6 lb

## 2021-05-28 DIAGNOSIS — I471 Supraventricular tachycardia: Secondary | ICD-10-CM | POA: Diagnosis not present

## 2021-05-28 DIAGNOSIS — I10 Essential (primary) hypertension: Secondary | ICD-10-CM

## 2021-05-28 MED ORDER — DILTIAZEM HCL ER COATED BEADS 240 MG PO CP24: 240.0000 mg | ORAL_CAPSULE | Freq: Every day | ORAL | 3 refills | Status: DC

## 2021-05-28 NOTE — Patient Instructions (Signed)
Increase diltiazem to 240 mg daily

## 2021-06-26 ENCOUNTER — Other Ambulatory Visit: Payer: Self-pay | Admitting: Family Medicine

## 2021-06-26 DIAGNOSIS — F900 Attention-deficit hyperactivity disorder, predominantly inattentive type: Secondary | ICD-10-CM

## 2021-06-26 NOTE — Telephone Encounter (Signed)
[facility] is requesting to fill pt vyvanse. Please advise [facility]

## 2021-07-17 ENCOUNTER — Other Ambulatory Visit: Payer: Self-pay | Admitting: Family Medicine

## 2021-07-17 DIAGNOSIS — E039 Hypothyroidism, unspecified: Secondary | ICD-10-CM

## 2021-07-17 MED ORDER — LEVOTHYROXINE SODIUM 50 MCG PO TABS: 50.0000 ug | ORAL_TABLET | Freq: Every day | ORAL | 0 refills | Status: DC

## 2021-08-11 ENCOUNTER — Encounter: Payer: Self-pay | Admitting: Hematology and Oncology

## 2021-08-18 ENCOUNTER — Encounter: Payer: Self-pay | Admitting: Family Medicine

## 2021-08-18 ENCOUNTER — Ambulatory Visit: Payer: No Typology Code available for payment source | Admitting: Family Medicine

## 2021-08-18 ENCOUNTER — Other Ambulatory Visit: Payer: Self-pay

## 2021-08-18 VITALS — BP 132/84 | HR 94 | Temp 98.6°F | Wt 179.8 lb

## 2021-08-18 DIAGNOSIS — F341 Dysthymic disorder: Secondary | ICD-10-CM

## 2021-08-18 DIAGNOSIS — Z23 Encounter for immunization: Secondary | ICD-10-CM | POA: Diagnosis not present

## 2021-08-18 DIAGNOSIS — M810 Age-related osteoporosis without current pathological fracture: Secondary | ICD-10-CM | POA: Insufficient documentation

## 2021-08-18 DIAGNOSIS — R252 Cramp and spasm: Secondary | ICD-10-CM

## 2021-08-18 DIAGNOSIS — R232 Flushing: Secondary | ICD-10-CM | POA: Diagnosis not present

## 2021-08-18 DIAGNOSIS — Z9849 Cataract extraction status, unspecified eye: Secondary | ICD-10-CM | POA: Insufficient documentation

## 2021-08-18 DIAGNOSIS — Z170 Estrogen receptor positive status [ER+]: Secondary | ICD-10-CM

## 2021-08-18 DIAGNOSIS — E039 Hypothyroidism, unspecified: Secondary | ICD-10-CM | POA: Diagnosis not present

## 2021-08-18 DIAGNOSIS — Z566 Other physical and mental strain related to work: Secondary | ICD-10-CM

## 2021-08-18 DIAGNOSIS — F900 Attention-deficit hyperactivity disorder, predominantly inattentive type: Secondary | ICD-10-CM

## 2021-08-18 DIAGNOSIS — C50411 Malignant neoplasm of upper-outer quadrant of right female breast: Secondary | ICD-10-CM

## 2021-08-18 DIAGNOSIS — I471 Supraventricular tachycardia, unspecified: Secondary | ICD-10-CM

## 2021-08-18 DIAGNOSIS — T451X5A Adverse effect of antineoplastic and immunosuppressive drugs, initial encounter: Secondary | ICD-10-CM

## 2021-08-18 MED ORDER — VENLAFAXINE HCL ER 150 MG PO CP24: ORAL_CAPSULE | ORAL | 3 refills | Status: DC

## 2021-08-18 MED ORDER — LEVOTHYROXINE SODIUM 50 MCG PO TABS
50.0000 ug | ORAL_TABLET | Freq: Every day | ORAL | 3 refills | Status: DC
Start: 2021-08-18 — End: 2021-12-25

## 2021-08-18 NOTE — Progress Notes (Signed)
   Subjective:    Patient ID: Christy [name], female    DOB: 1956-04-09, 65 y.o.   MRN: [id number]  HPI She is here for an interval evaluation.  She states that she does feel burnt out from work due to stress.  She is considering retiring and possibly doing something entirely different.  She continues to complain of leg cramps and has used Flexeril in the past on a sparing basis.  Her allergies seem to be under good control and she does use alternating Allegra and Zyrtec to help with that.  She does have underlying ADD and is using Vyvanse.  She gets roughly 10 to 12 hours with the benefit out of it.  She has had cataract removed from both eyes.  She follows up regularly with cardiology concerning her underlying PSVT.  She follows up regularly with oncology due to her underlying bilateral breast cancer disease.  She continues on Synthroid and is having no difficulty with that.   Review of Systems     Objective:   Physical Exam Alert and in no distress otherwise not examined.       Assessment & Plan:  Work stress  Need for vaccination against Streptococcus pneumoniae - Plan: Pneumococcal conjugate vaccine 20-valent (Prevnar 20)  Hypothyroidism, unspecified type - Plan: levothyroxine (SYNTHROID) 50 MCG tablet  Dysthymia - Plan: venlafaxine XR (EFFEXOR-XR) 150 MG 24 hr capsule  Hot flashes related to aromatase inhibitor therapy - Plan: venlafaxine XR (EFFEXOR-XR) 150 MG 24 hr capsule  SVT (supraventricular tachycardia) (H[facility])  Osteoporosis, unspecified osteoporosis type, unspecified pathological fracture presence  Attention deficit hyperactivity disorder (ADHD), predominantly inattentive type  Malignant neoplasm of upper-outer quadrant of right breast in female, estrogen receptor positive ([facility])  Leg cramps  Status post cataract extraction, unspecified laterality Discussed switching to a different job and told her to do this from the physician a power and to not quit 1 job before  she has another 1 lined up.  Continue on present medications.  Did recommend plenty of fluids and Tylenol for the cramping rather than Flexeril as it does make her slightly drowsy.  Continue on her ADD medication. I then discussed Fosamax with her and strongly encouraged her to go ahead and start on that since it was given to her by her oncologist and I think the risk for the dental problems is minimal.

## 2021-10-15 ENCOUNTER — Encounter: Payer: Self-pay | Admitting: Hematology and Oncology

## 2021-10-16 ENCOUNTER — Other Ambulatory Visit: Payer: Self-pay | Admitting: Family Medicine

## 2021-10-16 DIAGNOSIS — F900 Attention-deficit hyperactivity disorder, predominantly inattentive type: Secondary | ICD-10-CM

## 2021-10-16 NOTE — Telephone Encounter (Signed)
[facility] is requesting to fill pt Vyvanse. Please advise . [place]

## 2021-10-21 ENCOUNTER — Other Ambulatory Visit: Payer: Self-pay | Admitting: *Deleted

## 2021-10-21 ENCOUNTER — Encounter: Payer: Self-pay | Admitting: Hematology and Oncology

## 2021-10-21 DIAGNOSIS — D509 Iron deficiency anemia, unspecified: Secondary | ICD-10-CM

## 2021-10-21 NOTE — Progress Notes (Signed)
Patient Care Team: Denita Lung, MD as PCP - General (Family Medicine) Martinique, Peter M, MD as PCP - Cardiology (Cardiology)  DIAGNOSIS:    ICD-10-CM   1. Encounter for preventive health examination  Z00.00 Lipid panel    TSH    CBC with Differential ([place])    CMP ( only)    Iron and TIBC    Ferritin    TSH    Lipid panel    2. Malignant neoplasm of upper-outer quadrant of right breast in female, estrogen receptor positive ([place])  C50.411 CBC with Differential ([place])   Z17.0 CMP ([place] only)    Iron and TIBC    Ferritin    TSH    Lipid panel      SUMMARY OF ONCOLOGIC HISTORY: Oncology History  Malignant neoplasm of upper-outer quadrant of right breast in female, estrogen receptor positive ([place])  1993 Initial Diagnosis   Breast cancer ([place]): right breast, stage 2, 3/41 nodes involved, ER/PR negative, HER-2 not tested.  , treated on NSABP 525 with Adriamycin, Cytoxan   06/2007 Relapse/Recurrence   Right breast cancer.  Bilateral mastectomies with reconstruction: Right: IDC, 0.6cm, grade 1, 3 SLN negative, T1b, N0, ER+(84%), PR+(38%), Ki-67 14%, HER-2 negative.   Left: negative   06/2007 Surgery   TAH/BSO   06/2007 Genetic Testing   Negative genetic testing   07/2007 - 10/2014 Anti-estrogen oral therapy   Letrozole daily     CHIEF COMPLIANT: Surveillance of breast cancer and iron deficiency anemia  INTERVAL HISTORY: Christy [name] is a 65 y.o. with above-mentioned history of recurrent right breast cancer currently on surveillance. She also has a history of iron deficiency anemia s/p gastric bypass surgery. She presents to the clinic today for annual follow-up.  She is doing quite well from the breast cancer standpoint.  Denies any lumps or nodules in the bilateral reconstructed breasts.  She is also doing well from the iron deficiency anemia standpoint.  She has new teeth and therefore she has gained a few pounds because  she is able to eat better.  ALLERGIES:  is allergic to morphine and related, levaquin [levofloxacin in d5w], and tylenol [acetaminophen].  MEDICATIONS:  Current Outpatient Medications  Medication Sig Dispense Refill   alendronate (FOSAMAX) 70 MG tablet Take 1 tablet (70 mg total) by mouth once a week. Take with a full glass of water on an empty stomach. (Patient not taking: Reported on 08/18/2021) 12 tablet 3   B Complex-C (B-COMPLEX WITH VITAMIN C) tablet Take 1 tablet by mouth daily.     cetirizine (ZYRTEC) 10 MG tablet      Cholecalciferol (VITAMIN D3) 5000 units CAPS Take 5,000 Units by mouth daily.      clobetasol cream (TEMOVATE) 0.93 % Apply 1 application topically 2 (two) times daily. (Patient not taking: Reported on 08/18/2021) 45 g 2   cyclobenzaprine (FLEXERIL) 10 MG tablet Take 10 mg by mouth 3 (three) times daily as needed for muscle spasms.     cycloSPORINE (RESTASIS) 0.05 % ophthalmic emulsion Place 2 drops into both eyes daily.     diltiazem (CARDIZEM CD) 240 MG 24 hr capsule Take 1 capsule (240 mg total) by mouth daily. 90 capsule 3   fexofenadine (ALLEGRA) 180 MG tablet Take 180 mg by mouth daily as needed for allergies or rhinitis.     ibuprofen (ADVIL) 600 MG tablet  (Patient not taking: Reported on 08/18/2021)     levothyroxine (SYNTHROID) 50  MCG tablet Take 1 tablet (50 mcg total) by mouth daily. 90 tablet 3   MAGNESIUM OXIDE PO Take 1 tablet by mouth daily.     Multiple Vitamin (MULTIVITAMIN) capsule Take 3 capsules by mouth daily.      triamcinolone cream (KENALOG) 0.5 % Apply 1 application topically 3 (three) times daily. (Patient not taking: Reported on 08/18/2021) 30 g 5   valACYclovir (VALTREX) 500 MG tablet Take 1 tablet (500 mg total) by mouth daily. (Patient not taking: Reported on 08/18/2021) 30 tablet 0   venlafaxine XR (EFFEXOR-XR) 150 MG 24 hr capsule TAKE 1 CAPSULE BY MOUTH EVERY DAY WITH BREAKFAST 90 capsule 3   VYVANSE 70 MG capsule Take 1 capsule (70 mg total)  by mouth daily. 90 capsule 0   No current facility-administered medications for this visit.    PHYSICAL EXAMINATION: ECOG PERFORMANCE STATUS: 1 - Symptomatic but completely ambulatory  Vitals:   10/22/21 0900  BP: (!) 152/77  Pulse: (!) 102  Resp: 16  Temp: 98.6 F (37 C)  SpO2: 99%   Filed Weights   10/22/21 0900  Weight: 187 lb 1 oz (84.9 kg)      LABORATORY DATA:  I have reviewed the data as listed CMP Latest Ref Rng & Units 10/22/2021 10/23/2020 10/25/2019  Glucose 70 - 99 mg/dL 104(H) 94 111(H)  BUN 8 - 23 mg/dL 7(L) 9 12  Creatinine 0.44 - 1.00 mg/dL 0.67 0.68 0.70  Sodium 135 - 145 mmol/L 144 138 140  Potassium 3.5 - 5.1 mmol/L 4.0 4.2 4.5  Chloride 98 - 111 mmol/L 110 105 105  CO2 22 - 32 mmol/L _0 Calcium 8.9 - 10.3 mg/dL 8.9 9.1 9.0  Total Protein 6.5 - 8.1 g/dL 6.5 6.6 6.9  Total Bilirubin 0.3 - 1.2 mg/dL 0.6 0.5 0.4  Alkaline Phos 38 - 126 U/L 99 114 125  AST 15 - 41 U/L _1 ALT 0 - 44 U/L 20 32 23    Lab Results  Component Value Date   WBC 4.2 10/22/2021   HGB 13.6 10/22/2021   HCT 41.5 10/22/2021   MCV 93.0 10/22/2021   PLT 152 10/22/2021   NEUTROABS 2.2 10/22/2021    ASSESSMENT & PLAN:  Malignant neoplasm of upper-outer quadrant of right breast in female, estrogen receptor positive ([place]) History of 2 primary right breast cancers: on observation since completed 7 years Femara 10-2014, clinically doing well.    Osteopenia: 01/10/2020: Bone density: T score -2.5: Osteoporosis, recommended calcium vitamin D and bisphosphonate therapy   Breast cancer surveillance:  Hysterectomy with oophorectomy 2008 prophylactic left mastectomy, bilateral breast reconstructions No role of imaging since she had bilateral mastectomies   Iron deficiency anemia Iron deficiency related to prior gastric bypass surgery 10/19/2018: Iron saturation 10%, hemoglobin 11.9, MCV 84.5, ferritin 8 Prior treatment: IV iron: 2014 and 2016, November 2020 Lab  review 10/22/2021: Hemoglobin 13.6, iron saturation 40%, ferritin 41, CMP normal    She works as a Teacher, 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   Return to clinic in 1 year for follow-up with labs.    Orders Placed This Encounter  Procedures   Lipid panel    Standing Status:   Future    Number of Occurrences:   1    Standing Expiration Date:   10/22/2022   TSH    Standing Status:   Future    Number of Occurrences:   1    Standing Expiration Date:  10/22/2022   CBC with Differential ([facility] Center Only)    Standing Status:   Future    Standing Expiration Date:   10/22/2022   CMP ([place] only)    Standing Status:   Future    Standing Expiration Date:   10/22/2022   Iron and TIBC    Standing Status:   Future    Standing Expiration Date:   10/22/2022   Ferritin    Standing Status:   Future    Standing Expiration Date:   10/22/2022   TSH    Standing Status:   Future    Standing Expiration Date:   10/22/2022   Lipid panel    Standing Status:   Future    Standing Expiration Date:   10/22/2022    The patient has a good understanding of the overall plan. she agrees with it. she will call with any problems that may develop before the next visit here.  Total time spent: 20 mins including face to face time and time spent for planning, charting and coordination of care  Rulon Eisenmenger, MD, MPH 10/22/2021  I, Thana Ates, am acting as scribe for Dr. Nicholas Lose.  I have reviewed the above documentation for accuracy and completeness, and I agree with the above.

## 2021-10-22 ENCOUNTER — Other Ambulatory Visit: Payer: Self-pay

## 2021-10-22 ENCOUNTER — Inpatient Hospital Stay: Payer: No Typology Code available for payment source

## 2021-10-22 ENCOUNTER — Telehealth: Payer: Self-pay | Admitting: *Deleted

## 2021-10-22 ENCOUNTER — Inpatient Hospital Stay
Payer: No Typology Code available for payment source | Attending: Hematology and Oncology | Admitting: Hematology and Oncology

## 2021-10-22 VITALS — BP 152/77 | HR 102 | Temp 98.6°F | Resp 16 | Wt 187.1 lb

## 2021-10-22 DIAGNOSIS — Z0000 Encounter for general adult medical examination without abnormal findings: Secondary | ICD-10-CM

## 2021-10-22 DIAGNOSIS — D509 Iron deficiency anemia, unspecified: Secondary | ICD-10-CM | POA: Insufficient documentation

## 2021-10-22 DIAGNOSIS — C50411 Malignant neoplasm of upper-outer quadrant of right female breast: Secondary | ICD-10-CM | POA: Insufficient documentation

## 2021-10-22 DIAGNOSIS — Z170 Estrogen receptor positive status [ER+]: Secondary | ICD-10-CM | POA: Diagnosis not present

## 2021-10-22 DIAGNOSIS — Z9884 Bariatric surgery status: Secondary | ICD-10-CM | POA: Insufficient documentation

## 2021-10-22 DIAGNOSIS — Z23 Encounter for immunization: Secondary | ICD-10-CM | POA: Diagnosis not present

## 2021-10-22 LAB — CMP (CANCER CENTER ONLY)
ALT: 20 U/L (ref 0–44)
AST: 25 U/L (ref 15–41)
Albumin: 3.4 g/dL — ABNORMAL LOW (ref 3.5–5.0)
Alkaline Phosphatase: 99 U/L (ref 38–126)
Anion gap: 9 (ref 5–15)
BUN: 7 mg/dL — ABNORMAL LOW (ref 8–23)
CO2: 25 mmol/L (ref 22–32)
Calcium: 8.9 mg/dL (ref 8.9–10.3)
Chloride: 110 mmol/L (ref 98–111)
Creatinine: 0.67 mg/dL (ref 0.44–1.00)
GFR, Estimated: >60 mL/min (ref >60)
Glucose, Bld: 104 mg/dL — ABNORMAL HIGH (ref 70–99)
Potassium: 4 mmol/L (ref 3.5–5.1)
Sodium: 144 mmol/L (ref 135–145)
Total Bilirubin: 0.6 mg/dL (ref 0.3–1.2)
Total Protein: 6.5 g/dL (ref 6.5–8.1)

## 2021-10-22 LAB — CBC WITH DIFFERENTIAL (CANCER CENTER ONLY)
Abs Immature Granulocytes: 0.01 10*3/uL (ref 0.00–0.07)
Basophils Absolute: 0 10*3/uL (ref 0.0–0.1)
Basophils Relative: 1 %
Eosinophils Absolute: 0.3 10*3/uL (ref 0.0–0.5)
Eosinophils Relative: 6 %
HCT: 41.5 % (ref 36.0–46.0)
Hemoglobin: 13.6 g/dL (ref 12.0–15.0)
Immature Granulocytes: 0 %
Lymphocytes Relative: 30 %
Lymphs Abs: 1.3 10*3/uL (ref 0.7–4.0)
MCH: 30.5 pg (ref 26.0–34.0)
MCHC: 32.8 g/dL (ref 30.0–36.0)
MCV: 93 fL (ref 80.0–100.0)
Monocytes Absolute: 0.4 10*3/uL (ref 0.1–1.0)
Monocytes Relative: 9 %
Neutro Abs: 2.2 10*3/uL (ref 1.7–7.7)
Neutrophils Relative %: 54 %
Platelet Count: 152 10*3/uL (ref 150–400)
RBC: 4.46 MIL/uL (ref 3.87–5.11)
RDW: 13.1 % (ref 11.5–15.5)
WBC Count: 4.2 10*3/uL (ref 4.0–10.5)
nRBC: 0 % (ref 0.0–0.2)

## 2021-10-22 LAB — TSH: TSH: 3.846 u[IU]/mL (ref 0.308–3.960)

## 2021-10-22 LAB — IRON AND TIBC
Iron: 140 ug/dL (ref 41–142)
Saturation Ratios: 40 % (ref 21–57)
TIBC: 346 ug/dL (ref 236–444)
UIBC: 207 ug/dL (ref 120–384)

## 2021-10-22 LAB — FERRITIN: Ferritin: 43 ng/mL (ref 11–307)

## 2021-10-22 LAB — LIPID PANEL
Cholesterol: 165 mg/dL (ref 0–200)
HDL: 52 mg/dL (ref >40)
LDL Cholesterol: 99 mg/dL (ref 0–99)
Total CHOL/HDL Ratio: 3.2 RATIO
Triglycerides: 68 mg/dL (ref <150)
VLDL: 14 mg/dL (ref 0–40)

## 2021-10-22 LAB — VITAMIN B12: Vitamin B-12: 337 pg/mL (ref 180–914)

## 2021-10-22 MED ORDER — INFLUENZA VAC A&B SA ADJ QUAD 0.5 ML IM PRSY
0.5000 mL | PREFILLED_SYRINGE | Freq: Once | INTRAMUSCULAR | Status: AC
  Administered 2021-10-22: 0.5 mL via INTRAMUSCULAR
  Filled 2021-10-22: qty 0.5

## 2021-10-22 NOTE — Assessment & Plan Note (Signed)
History of 2 primary right breast cancers: on observation since completed7 years Femara 10-2014, clinically doing well.   Osteopenia: 01/10/2020: Bone density: T score -2.5: Osteoporosis, recommended calcium vitamin D and bisphosphonate therapy  Breast cancer surveillance:  Hysterectomy with oophorectomy 2008 prophylactic left mastectomy, bilateral breast reconstructions No role of imaging since she had bilateral mastectomies  Iron deficiency anemia Iron deficiency related to prior gastric bypass surgery 10/19/2018: Iron saturation 10%, hemoglobin 11.9, MCV 84.5, ferritin 8 Prior treatment: IV iron: 2014 and 2016, November 2020  Labs will be drawn today and I will call her with the results of this test.  She has lost another 24 pounds since last year. She works as a Teacher, music at the emergency room.  Return to clinic in 1 year for follow-up with labs.

## 2021-10-22 NOTE — Telephone Encounter (Signed)
error 

## 2021-12-05 NOTE — Progress Notes (Signed)
Cardiology Office Note   Date:  12/09/2021   ID:  Christy [name], DOB [birth year], MRN [id number]  PCP:  Denita Lung, MD  Cardiologist:   Wei Newbrough Martin[place], MD   Chief Complaint  Patient presents with   Palpitations       History of Present Illness: Christy [name] is a 65 y.o. female who is seen at the request of Dr Redmond School for evaluation of SVT. Has a history of ADD on Adderall. Also history of hypothyroidism. She is a Psychiatry PA. She was seen in the ED at [facility] on March 2 for tachycardia.  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 She felt her heart racing.  She checked her apple watch, which told her that her heart rate was in the 150s. By the time that she got back to her desk and sat down her heart rate was in the 200s. She had an NA in the CDU check her vitals, her heart rate was greater than 200. She presented  to the main emergency department.  EKG consistent with SVT. After attempting vagal maneuver multiple times and administration of 2 L of IV fluid, patient converted to normal sinus rhythm. A Zio patch monitor was placed and she was DC home on no additional medication.   I have seen her in the past. Initially diagnosed with breast CA in 1993. Had chemo and RT. Echo at that time was ok. Had repeat Echo in 2009. Had some MAC but was otherwise normal. Had new focus of breast CA and subsequently underwent bilateral mastectomy and reconstruction. Was on Femara for 7 years and then discontinued. She has been on Adderall for many years as well as Vynase. States she has a hard time functioning without it. 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 She reports episodes of tachycardia since this past summer. Had a bad episode around [place]. Typically symptoms are short lived and less than 10 minutes. Convert on their own. Usually gradually slow down.   On follow up today she reports she is doing very well.  Does not exercise.  Palpitations are well controlled on Diltiazem.    Past Medical History:  Diagnosis Date   ADD (attention deficit disorder)    Allergy    rhinitis   Anemia    Arthritis    osteo   Breast cancer ([place]) 4097,3532   Breast , skin - basal    Complication of anesthesia 2004   gastric bypass [facility] ,  acetyl succ-, hard time awaking, placed back on vent for a couiple hour   Dyspnea    GERD (gastroesophageal reflux disease)    History of hiatal hernia    History of kidney stones    passed   Hypothyroid    Internal hemorrhoids    Obesity    Pneumonia    Tubular adenoma of colon     Past Surgical History:  Procedure Laterality Date   BREAST LUMPECTOMY Right    CESAREAN SECTION     x 2   CHOLECYSTECTOMY     COLONOSCOPY  07/2013   ESOPHAGOGASTRODUODENOSCOPY     GASTRIC BYPASS     JOINT REPLACEMENT     KNEE ARTHROSCOPY Right    x 3   LAPAROSCOPIC APPENDECTOMY N/A 04/03/2015   Procedure: APPENDECTOMY LAPAROSCOPIC;  Surgeon: Autumn Messing III, MD;  Location: [facility];  Service: General;  Laterality: N/A;   LITHOTRIPSY     x3  MASTECTOMY Bilateral    with lymph node dissection  bil reconstruction 2009   TOTAL KNEE ARTHROPLASTY Right 12/18/2013   Procedure: TOTAL KNEE ARTHROPLASTY;  Surgeon: Kerin Salen, MD;  Location: [facility];  Service: Orthopedics;  Laterality: Right;   TOTAL KNEE ARTHROPLASTY Left 06/29/2018   TOTAL KNEE ARTHROPLASTY Left 06/29/2018   Procedure: LEFT TOTAL KNEE ARTHROPLASTY;  Surgeon: Frederik Pear, MD;  Location: [facility];  Service: Orthopedics;  Laterality: Left;   VAGINAL HYSTERECTOMY       Current Outpatient Medications  Medication Sig Dispense Refill   alendronate (FOSAMAX) 70 MG tablet Take 1 tablet (70 mg total) by mouth once a week. Take with a full glass of water on an empty stomach. 12 tablet 3   B Complex-C (B-COMPLEX WITH VITAMIN C) tablet Take 1 tablet by mouth daily.     cetirizine (ZYRTEC) 10 MG tablet      Cholecalciferol (VITAMIN D3) 5000  units CAPS Take 5,000 Units by mouth daily.      clobetasol cream (TEMOVATE) 5.00 % Apply 1 application topically 2 (two) times daily. 45 g 2   cyclobenzaprine (FLEXERIL) 10 MG tablet Take 10 mg by mouth 3 (three) times daily as needed for muscle spasms.     cycloSPORINE (RESTASIS) 0.05 % ophthalmic emulsion Place 2 drops into both eyes daily.     fexofenadine (ALLEGRA) 180 MG tablet Take 180 mg by mouth daily as needed for allergies or rhinitis.     ibuprofen (ADVIL) 600 MG tablet      levothyroxine (SYNTHROID) 50 MCG tablet Take 1 tablet (50 mcg total) by mouth daily. 90 tablet 3   Multiple Vitamin (MULTIVITAMIN) capsule Take 3 capsules by mouth daily.      valACYclovir (VALTREX) 500 MG tablet Take 1 tablet (500 mg total) by mouth daily. 30 tablet 0   venlafaxine XR (EFFEXOR-XR) 150 MG 24 hr capsule TAKE 1 CAPSULE BY MOUTH EVERY DAY WITH BREAKFAST 90 capsule 3   VYVANSE 70 MG capsule Take 1 capsule (70 mg total) by mouth daily. 90 capsule 0   diltiazem (CARDIZEM CD) 240 MG 24 hr capsule Take 1 capsule (240 mg total) by mouth daily. 90 capsule 3   No current facility-administered medications for this visit.    Allergies:   Morphine and related, Levaquin [levofloxacin in d5w], and Tylenol [acetaminophen]    Social History:  The patient  reports that she has never smoked. She has never used smokeless tobacco. She reports current alcohol use of about 7.0 standard drinks per week. She reports that she does not use drugs.   Family History:  The patient's family history includes Atrial fibrillation in her mother; Breast cancer in her paternal aunt; COPD in her father; Colon polyps in her mother.    ROS:  Please see the history of present illness.      All other systems are reviewed and negative.    PHYSICAL EXAM: VS:  BP 122/78 (BP Location: Left Arm, Patient Position: Sitting, Cuff Size: Normal)   Pulse 82   Resp 20   Ht _0  (1.676 m)   Wt 192 lb 9.6 oz (87.4 kg)   SpO2 99%   BMI  31.09 kg/m  , BMI Body mass index is 31.09 kg/m. GEN: Well nourished, well developed, in no acute distress  HEENT: normal  Neck: no JVD, carotid bruits, or masses Cardiac: RRR; soft 1/6 systolic murmur RUSB. no rubs, or gallops,no edema  Respiratory:  clear to auscultation bilaterally, normal work  of breathing GI: soft, nontender, nondistended, + BS MS: no deformity or atrophy  Skin: warm and dry, no rash Neuro:  Strength and sensation are intact Psych: euthymic mood, full affect   EKG:  EKG is  ordered today.   Recent Labs: 10/22/2021: ALT 20; BUN 7; Creatinine 0.67; Hemoglobin 13.6; Platelet Count 152; Potassium 4.0; Sodium 144; TSH 3.846    Lipid Panel    Component Value Date/Time   CHOL 165 10/22/2021 0929   CHOL 121 10/14/2017 1541   TRIG 68 10/22/2021 0929   TRIG 51 10/14/2017 1541   HDL 52 10/22/2021 0929   HDL 49 10/14/2017 1541   CHOLHDL 3.2 10/22/2021 0929   VLDL 14 10/22/2021 0929   LDLCALC 99 10/22/2021 0929   LDLCALC 62 10/14/2017 1541    Labs dated 02/27/20: normal TSH, CMET, CBC  Wt Readings from Last 3 Encounters:  12/09/21 192 lb 9.6 oz (87.4 kg)  10/22/21 187 lb 1 oz (84.9 kg)  08/18/21 179 lb 12.8 oz (81.6 kg)      Other studies Reviewed: Additional studies/ records that were reviewed today include:   Zio patch monitor reviewed. One 4 beat run of NSVT. 85 episodes of SVT longest 8.2 seconds. Initiated with PAC.   Echo 11/20/20: IMPRESSIONS     1. Technically difficult study. Left ventricular ejection fraction, by  estimation, is 55 to 60%. The left ventricle has normal function. The left  ventricle has no regional wall motion abnormalities. Left ventricular  diastolic parameters are  indeterminate.   2. Right ventricular systolic function is normal. The right ventricular  size is normal. Tricuspid regurgitation signal is inadequate for assessing  PA pressure.   3. The mitral valve is abnormal. No evidence of mitral valve  regurgitation.  Mild mitral stenosis. Moderate mitral annular  calcification. MG 5 mmHg at HR 102 bpm.   4. The aortic valve is tricuspid. Aortic valve regurgitation is not  visualized. No aortic stenosis is present.   5. Aortic dilatation noted. 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   6. The inferior vena cava is normal in size with greater than 50%  respiratory variability, suggesting right atrial pressure of 3 mmHg.    ASSESSMENT AND PLAN:  1.  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  Exacerbated by caffeine and Adderal/Vynase and multiple  stressors. Now doing well on Diltiazem. Rx refilled. Will continue current therapy. 2. History of breast CA. Remote Chemo and RT- follow up Echo was unremarkable.  3. S/p gastric banding.  4. Hypothyroid. TSH normal.   Current medicines are reviewed at length with the patient today.  The patient does not have concerns regarding medicines.  The following changes have been made: none  Labs/ tests ordered today include: none  No orders of the defined types were placed in this encounter.    Disposition:   FU with me in one year  Signed, Roza Creamer [place], MD  12/09/2021 9:48 AM    [facility] [address], [place], [place], [address] Phone [phone], Fax [phone]

## 2021-12-09 ENCOUNTER — Other Ambulatory Visit: Payer: Self-pay

## 2021-12-09 ENCOUNTER — Ambulatory Visit: Payer: No Typology Code available for payment source | Admitting: Cardiology

## 2021-12-09 ENCOUNTER — Encounter: Payer: Self-pay | Admitting: Cardiology

## 2021-12-09 VITALS — BP 122/78 | HR 82 | Resp 20 | Ht 66.0 in | Wt 192.6 lb

## 2021-12-09 DIAGNOSIS — I10 Essential (primary) hypertension: Secondary | ICD-10-CM | POA: Diagnosis not present

## 2021-12-09 DIAGNOSIS — I471 Supraventricular tachycardia: Secondary | ICD-10-CM | POA: Diagnosis not present

## 2021-12-09 MED ORDER — DILTIAZEM HCL ER COATED BEADS 240 MG PO CP24: 240.0000 mg | ORAL_CAPSULE | Freq: Every day | ORAL | 3 refills | Status: DC

## 2021-12-25 ENCOUNTER — Other Ambulatory Visit: Payer: Self-pay | Admitting: Family Medicine

## 2021-12-25 DIAGNOSIS — E039 Hypothyroidism, unspecified: Secondary | ICD-10-CM

## 2021-12-30 ENCOUNTER — Ambulatory Visit: Payer: PRIVATE HEALTH INSURANCE | Admitting: Cardiology

## 2022-02-03 ENCOUNTER — Other Ambulatory Visit: Payer: Self-pay | Admitting: Family Medicine

## 2022-02-03 DIAGNOSIS — F900 Attention-deficit hyperactivity disorder, predominantly inattentive type: Secondary | ICD-10-CM

## 2022-02-03 NOTE — Telephone Encounter (Signed)
[facility] is requesting to fill pt vyvanse. Please advise [facility]

## 2022-02-05 ENCOUNTER — Telehealth: Payer: Self-pay

## 2022-02-05 ENCOUNTER — Encounter: Payer: Self-pay | Admitting: Family Medicine

## 2022-02-05 NOTE — Telephone Encounter (Signed)
Called pt.

## 2022-02-05 NOTE — Telephone Encounter (Signed)
Pt states Vyvanse cost [phone].  Tried to do P.A. states covered.  Called [facility] t# [phone] Vyvanse is covered, Cost is $671 for 90 days,  is Tier 3 and Tiering exception will not help because pt has deductible of $1500 to be met first and pt has to pay full price of meds until deductible is met.  I also checked on Tier 1 amphet/dex or methylphenidate and cost is approximately $480.  Same situation applies.  They were not sure if the deductible is only pharmacy or if includes medical also pt will have to call member services to find out.  Called pt and informed only other option is [facility] and Vyvanse $1000 with discount but generic Adderall XR $70 if wants to see if Dr. Redmond School would switch her.  She states she will just pay out of pocket for the Vyvanse until she meets deductible.

## 2022-04-13 ENCOUNTER — Other Ambulatory Visit: Payer: Self-pay | Admitting: Family Medicine

## 2022-04-13 DIAGNOSIS — E039 Hypothyroidism, unspecified: Secondary | ICD-10-CM

## 2022-04-16 ENCOUNTER — Other Ambulatory Visit: Payer: Self-pay | Admitting: Hematology and Oncology

## 2022-05-22 ENCOUNTER — Encounter: Payer: Self-pay | Admitting: Hematology and Oncology

## 2022-05-26 ENCOUNTER — Ambulatory Visit (INDEPENDENT_AMBULATORY_CARE_PROVIDER_SITE_OTHER): Payer: PRIVATE HEALTH INSURANCE | Admitting: Family Medicine

## 2022-05-26 ENCOUNTER — Encounter: Payer: Self-pay | Admitting: Family Medicine

## 2022-05-26 VITALS — BP 148/90 | HR 94 | Temp 98.8°F | Ht 66.5 in | Wt 191.0 lb

## 2022-05-26 DIAGNOSIS — M1712 Unilateral primary osteoarthritis, left knee: Secondary | ICD-10-CM

## 2022-05-26 DIAGNOSIS — E039 Hypothyroidism, unspecified: Secondary | ICD-10-CM | POA: Diagnosis not present

## 2022-05-26 DIAGNOSIS — Z23 Encounter for immunization: Secondary | ICD-10-CM

## 2022-05-26 DIAGNOSIS — Z0000 Encounter for general adult medical examination without abnormal findings: Secondary | ICD-10-CM | POA: Diagnosis not present

## 2022-05-26 DIAGNOSIS — C50411 Malignant neoplasm of upper-outer quadrant of right female breast: Secondary | ICD-10-CM | POA: Diagnosis not present

## 2022-05-26 DIAGNOSIS — Z170 Estrogen receptor positive status [ER+]: Secondary | ICD-10-CM

## 2022-05-26 DIAGNOSIS — Z9849 Cataract extraction status, unspecified eye: Secondary | ICD-10-CM

## 2022-05-26 DIAGNOSIS — F341 Dysthymic disorder: Secondary | ICD-10-CM

## 2022-05-26 DIAGNOSIS — D509 Iron deficiency anemia, unspecified: Secondary | ICD-10-CM

## 2022-05-26 DIAGNOSIS — Z9884 Bariatric surgery status: Secondary | ICD-10-CM | POA: Diagnosis not present

## 2022-05-26 DIAGNOSIS — I471 Supraventricular tachycardia: Secondary | ICD-10-CM

## 2022-05-26 DIAGNOSIS — Z96651 Presence of right artificial knee joint: Secondary | ICD-10-CM

## 2022-05-26 DIAGNOSIS — F900 Attention-deficit hyperactivity disorder, predominantly inattentive type: Secondary | ICD-10-CM

## 2022-05-26 DIAGNOSIS — H9313 Tinnitus, bilateral: Secondary | ICD-10-CM

## 2022-05-26 DIAGNOSIS — H9190 Unspecified hearing loss, unspecified ear: Secondary | ICD-10-CM

## 2022-05-26 DIAGNOSIS — M810 Age-related osteoporosis without current pathological fracture: Secondary | ICD-10-CM

## 2022-05-26 MED ORDER — LISDEXAMFETAMINE DIMESYLATE 70 MG PO CAPS: 70.0000 mg | ORAL_CAPSULE | Freq: Every day | ORAL | 0 refills | Status: DC

## 2022-05-26 NOTE — Progress Notes (Signed)
   Subjective:    Patient ID: Christy [name], female    DOB: [birth year], 66 y.o.   MRN: [id number]  HPI She is here for complete examination.  She is followed regularly by oncology for her history of breast cancer.  She has had a mastectomy bilaterally.  She has also had a hysterectomy.  She gets regular blood work through oncology and will do this in the fall.  She does have a history of colonic polyps and is scheduled for repeat colonoscopy in 2025.  She does have osteoporosis and presently is on Fosamax.  Vyvanse seems to be doing a fairly decent job with her ADD.  She also has a history of SVT and has been followed by cardiology for that.  She is on diltiazem.  She does have some arthritic symptoms and has had a joint replacement.  She continues on her thyroid medication.  Effexor continues to work well for her dysthymia.  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  Otherwise her family and social history as well as health maintenance and immunizations was reviewed   Review of Systems  All other systems reviewed and are negative.     Objective:   Physical Exam Alert and in no distress. Tympanic membranes and canals are normal. Pharyngeal area is normal. Neck is supple without adenopathy or thyromegaly. Cardiac exam shows a regular sinus rhythm without murmurs or gallops. Lungs are clear to auscultation.        Assessment & Plan:  Need for Tdap vaccination - Plan: Tdap vaccine greater than or equal to 7yo IM  Hypothyroidism, unspecified type  Dysthymia  Malignant neoplasm of upper-outer quadrant of right breast in female, estrogen receptor positive ([place])  Osteoarthritis of left knee, unspecified osteoarthritis type  H/O gastric bypass  Iron deficiency anemia, unspecified iron deficiency anemia type  Attention deficit hyperactivity disorder (ADHD), predominantly inattentive type - Plan: lisdexamfetamine (VYVANSE) 70 MG capsule  Status post total right knee  replacement using cement  Osteoporosis, unspecified osteoporosis type, unspecified pathological fracture presence - Plan: DG Bone Density  Hearing loss, unspecified hearing loss type, unspecified laterality  Tinnitus of both ears  SVT (supraventricular tachycardia) (H[facility])  Status post cataract extraction, unspecified laterality She will get her blood work done when she sees the oncologist and I recommend getting the hepatitis C at that point.  Vyvanse was renewed.  DEXA scan ordered. She will continue on her other medications.  Tdap also given.

## 2022-06-03 ENCOUNTER — Other Ambulatory Visit: Payer: Self-pay | Admitting: Family Medicine

## 2022-06-03 DIAGNOSIS — L309 Dermatitis, unspecified: Secondary | ICD-10-CM

## 2022-06-03 NOTE — Telephone Encounter (Signed)
Is this okay to refill? 

## 2022-07-06 ENCOUNTER — Telehealth (INDEPENDENT_AMBULATORY_CARE_PROVIDER_SITE_OTHER): Payer: PRIVATE HEALTH INSURANCE | Admitting: Family Medicine

## 2022-07-06 ENCOUNTER — Encounter: Payer: Self-pay | Admitting: Family Medicine

## 2022-07-06 VITALS — Wt 186.0 lb

## 2022-07-06 DIAGNOSIS — U071 COVID-19: Secondary | ICD-10-CM | POA: Diagnosis not present

## 2022-07-06 NOTE — Progress Notes (Signed)
   Subjective:    Patient ID: Christy [name], female    DOB: 10-Apr-1956, 66 y.o.   MRN: [id number]  HPI Documentation for virtual audio and video telecommunications through [place] encounter: The patient was located at home. 2 patient identifiers used.  The provider was located in the office. The patient did consent to this visit and is aware of possible charges through their insurance for this visit. The other persons participating in this telemedicine service were none. Time spent on call was 5 minutes and in review of previous records >15 minutes total for counseling and coordination of care. This virtual service is not related to other E/M service within previous 7 days.  She states that Thursday evening she developed a headache followed by sneezing, coughing, fever, chills, myalgias with fatigue and diarrhea.  No shortness of breath.  She was exposed to COVID and finally tested last night and the test was positive.  Review of Systems     Objective:   Physical Exam Alert and toxic appearing with normal breathing pattern       Assessment & Plan:  COVID-19 I explained that the 5-day timeframe would would end tomorrow but she is still feeling quite poorly.  Discussed worsening fever, chills, shortness of breath with her.  At this point she is not interested in going on Paxlovid.  Recommend continuing with Tylenol and judicious use of NSAIDs.  Explained that even after 5 days if she starts to feel better she would still need to use a mask for another 5 days.  She is apparently scheduled to do virtual consults on Friday.  We will touch bases Thursday to see how she is doing.  She was comfortable with that.

## 2022-07-09 ENCOUNTER — Encounter: Payer: Self-pay | Admitting: Family Medicine

## 2022-07-10 ENCOUNTER — Encounter: Payer: Self-pay | Admitting: Internal Medicine

## 2022-08-24 ENCOUNTER — Other Ambulatory Visit: Payer: Self-pay | Admitting: Family Medicine

## 2022-08-24 DIAGNOSIS — E039 Hypothyroidism, unspecified: Secondary | ICD-10-CM

## 2022-08-28 ENCOUNTER — Encounter: Payer: Self-pay | Admitting: Family Medicine

## 2022-09-02 ENCOUNTER — Encounter: Payer: Self-pay | Admitting: Internal Medicine

## 2022-09-10 ENCOUNTER — Other Ambulatory Visit: Payer: Self-pay

## 2022-09-25 ENCOUNTER — Other Ambulatory Visit: Payer: Self-pay | Admitting: Family Medicine

## 2022-09-25 DIAGNOSIS — T451X5A Adverse effect of antineoplastic and immunosuppressive drugs, initial encounter: Secondary | ICD-10-CM

## 2022-09-25 DIAGNOSIS — F341 Dysthymic disorder: Secondary | ICD-10-CM

## 2022-09-25 NOTE — Telephone Encounter (Signed)
[facility] is requesting to fill pt Effexor. Please advise [facility]

## 2022-09-28 ENCOUNTER — Other Ambulatory Visit: Payer: Self-pay | Admitting: Family Medicine

## 2022-09-28 DIAGNOSIS — F900 Attention-deficit hyperactivity disorder, predominantly inattentive type: Secondary | ICD-10-CM

## 2022-09-28 NOTE — Telephone Encounter (Signed)
[facility] is requesting to fill vyvanse. Please advise [facility]

## 2022-10-06 ENCOUNTER — Encounter: Payer: Self-pay | Admitting: Internal Medicine

## 2022-10-15 ENCOUNTER — Encounter: Payer: Self-pay | Admitting: Hematology and Oncology

## 2022-10-19 ENCOUNTER — Encounter: Payer: Self-pay | Admitting: Internal Medicine

## 2022-10-22 ENCOUNTER — Encounter: Payer: Self-pay | Admitting: Hematology and Oncology

## 2022-10-22 ENCOUNTER — Inpatient Hospital Stay: Payer: PRIVATE HEALTH INSURANCE | Attending: Hematology and Oncology

## 2022-10-22 ENCOUNTER — Other Ambulatory Visit: Payer: Self-pay

## 2022-10-22 ENCOUNTER — Inpatient Hospital Stay: Payer: PRIVATE HEALTH INSURANCE | Admitting: Hematology and Oncology

## 2022-10-22 DIAGNOSIS — Z0000 Encounter for general adult medical examination without abnormal findings: Secondary | ICD-10-CM

## 2022-10-22 DIAGNOSIS — Z79811 Long term (current) use of aromatase inhibitors: Secondary | ICD-10-CM | POA: Insufficient documentation

## 2022-10-22 DIAGNOSIS — Z170 Estrogen receptor positive status [ER+]: Secondary | ICD-10-CM

## 2022-10-22 DIAGNOSIS — Z853 Personal history of malignant neoplasm of breast: Secondary | ICD-10-CM | POA: Diagnosis not present

## 2022-10-22 DIAGNOSIS — M8580 Other specified disorders of bone density and structure, unspecified site: Secondary | ICD-10-CM | POA: Insufficient documentation

## 2022-10-22 DIAGNOSIS — Z9884 Bariatric surgery status: Secondary | ICD-10-CM | POA: Insufficient documentation

## 2022-10-22 DIAGNOSIS — D508 Other iron deficiency anemias: Secondary | ICD-10-CM | POA: Diagnosis present

## 2022-10-22 DIAGNOSIS — Z79899 Other long term (current) drug therapy: Secondary | ICD-10-CM | POA: Diagnosis not present

## 2022-10-22 DIAGNOSIS — C50411 Malignant neoplasm of upper-outer quadrant of right female breast: Secondary | ICD-10-CM

## 2022-10-22 DIAGNOSIS — Z9013 Acquired absence of bilateral breasts and nipples: Secondary | ICD-10-CM | POA: Insufficient documentation

## 2022-10-22 DIAGNOSIS — Z90710 Acquired absence of both cervix and uterus: Secondary | ICD-10-CM | POA: Insufficient documentation

## 2022-10-22 DIAGNOSIS — D509 Iron deficiency anemia, unspecified: Secondary | ICD-10-CM | POA: Diagnosis not present

## 2022-10-22 LAB — CMP (CANCER CENTER ONLY)
ALT: 19 U/L (ref 0–44)
AST: 21 U/L (ref 15–41)
Albumin: 3.6 g/dL (ref 3.5–5.0)
Alkaline Phosphatase: 89 U/L (ref 38–126)
Anion gap: 6 (ref 5–15)
BUN: 7 mg/dL — ABNORMAL LOW (ref 8–23)
CO2: 27 mmol/L (ref 22–32)
Calcium: 8.7 mg/dL — ABNORMAL LOW (ref 8.9–10.3)
Chloride: 109 mmol/L (ref 98–111)
Creatinine: 0.71 mg/dL (ref 0.44–1.00)
GFR, Estimated: >60 mL/min (ref >60)
Glucose, Bld: 80 mg/dL (ref 70–99)
Potassium: 4.1 mmol/L (ref 3.5–5.1)
Sodium: 142 mmol/L (ref 135–145)
Total Bilirubin: 0.4 mg/dL (ref 0.3–1.2)
Total Protein: 6.5 g/dL (ref 6.5–8.1)

## 2022-10-22 LAB — CBC WITH DIFFERENTIAL (CANCER CENTER ONLY)
Abs Immature Granulocytes: 0.01 10*3/uL (ref 0.00–0.07)
Basophils Absolute: 0.1 10*3/uL (ref 0.0–0.1)
Basophils Relative: 1 %
Eosinophils Absolute: 0.3 10*3/uL (ref 0.0–0.5)
Eosinophils Relative: 6 %
HCT: 41.6 % (ref 36.0–46.0)
Hemoglobin: 13.7 g/dL (ref 12.0–15.0)
Immature Granulocytes: 0 %
Lymphocytes Relative: 29 %
Lymphs Abs: 1.3 10*3/uL (ref 0.7–4.0)
MCH: 30.5 pg (ref 26.0–34.0)
MCHC: 32.9 g/dL (ref 30.0–36.0)
MCV: 92.7 fL (ref 80.0–100.0)
Monocytes Absolute: 0.4 10*3/uL (ref 0.1–1.0)
Monocytes Relative: 10 %
Neutro Abs: 2.3 10*3/uL (ref 1.7–7.7)
Neutrophils Relative %: 54 %
Platelet Count: 153 10*3/uL (ref 150–400)
RBC: 4.49 MIL/uL (ref 3.87–5.11)
RDW: 12.8 % (ref 11.5–15.5)
WBC Count: 4.3 10*3/uL (ref 4.0–10.5)
nRBC: 0 % (ref 0.0–0.2)

## 2022-10-22 LAB — IRON AND IRON BINDING CAPACITY (CC-WL,HP ONLY)
Iron: 62 ug/dL (ref 28–170)
Saturation Ratios: 19 % (ref 10.4–31.8)
TIBC: 329 ug/dL (ref 250–450)
UIBC: 267 ug/dL (ref 148–442)

## 2022-10-22 LAB — LIPID PANEL
Cholesterol: 154 mg/dL (ref 0–200)
HDL: 55 mg/dL (ref >40)
LDL Cholesterol: 88 mg/dL (ref 0–99)
Total CHOL/HDL Ratio: 2.8 RATIO
Triglycerides: 55 mg/dL (ref <150)
VLDL: 11 mg/dL (ref 0–40)

## 2022-10-22 LAB — FERRITIN: Ferritin: 37 ng/mL (ref 11–307)

## 2022-10-22 LAB — TSH: TSH: 3.594 u[IU]/mL (ref 0.350–4.500)

## 2022-10-22 NOTE — Progress Notes (Signed)
Patient Care Team: Denita Lung, MD as PCP - General (Family Medicine) Martinique, Peter M, MD as PCP - Cardiology (Cardiology)  DIAGNOSIS:  Encounter Diagnoses  Name Primary?   Malignant neoplasm of upper-outer quadrant of right breast in female, estrogen receptor positive ([place])    Iron deficiency anemia, unspecified iron deficiency anemia type     SUMMARY OF ONCOLOGIC HISTORY: Oncology History  Malignant neoplasm of upper-outer quadrant of right breast in female, estrogen receptor positive ([place])  1993 Initial Diagnosis   Breast cancer ([place]): right breast, stage 2, 3/41 nodes involved, ER/PR negative, HER-2 not tested.  , treated on NSABP 525 with Adriamycin, Cytoxan   06/2007 Relapse/Recurrence   Right breast cancer.  Bilateral mastectomies with reconstruction: Right: IDC, 0.6cm, grade 1, 3 SLN negative, T1b, N0, ER+(84%), PR+(38%), Ki-67 14%, HER-2 negative.   Left: negative   06/2007 Surgery   TAH/BSO   06/2007 Genetic Testing   Negative genetic testing   07/2007 - 10/2014 Anti-estrogen oral therapy   Letrozole daily     CHIEF COMPLIANT: Surveillance of breast cancer and iron deficiency anemia    INTERVAL HISTORY: Christy [name] is a 66 y.o. with above-mentioned history of recurrent right breast cancer currently on surveillance. She also has a history of iron deficiency anemia s/p gastric bypass surgery. She presents to the clinic today for annual follow-up.    She had COVID-19 infection in July and since then she has had major problems with remembering things and cloudiness in the head. Since then she made several changes to her work schedule and does not do nights anymore and is sleeping at a regular time.  She however has not been exercising.   ALLERGIES:  is allergic to morphine and related, levaquin [levofloxacin in d5w], and tylenol [acetaminophen].  MEDICATIONS:  Current Outpatient Medications  Medication Sig Dispense Refill   alendronate (FOSAMAX) 70 MG  tablet Take 1 tablet (70 mg total) by mouth once a week. Take with a full glass of water on an empty stomach. 12 tablet 3   B Complex-C (B-COMPLEX WITH VITAMIN C) tablet Take 1 tablet by mouth daily.     cetirizine (ZYRTEC) 10 MG tablet      Cholecalciferol (VITAMIN D3) 5000 units CAPS Take 5,000 Units by mouth daily.      clobetasol cream (TEMOVATE) 2.22 % Apply 1 application topically 2 (two) times daily. 45 g 2   cyclobenzaprine (FLEXERIL) 10 MG tablet Take 10 mg by mouth 3 (three) times daily as needed for muscle spasms. (Patient not taking: Reported on 05/26/2022)     cycloSPORINE (RESTASIS) 0.05 % ophthalmic emulsion Place 2 drops into both eyes daily.     diltiazem (CARDIZEM CD) 240 MG 24 hr capsule Take 1 capsule (240 mg total) by mouth daily. 90 capsule 3   fexofenadine (ALLEGRA) 180 MG tablet Take 180 mg by mouth daily as needed for allergies or rhinitis.     ibuprofen (ADVIL) 600 MG tablet      levothyroxine (SYNTHROID) 50 MCG tablet Take 1 tablet (50 mcg total) by mouth daily. 90 tablet 1   Multiple Vitamin (MULTIVITAMIN) capsule Take 3 capsules by mouth daily.      valACYclovir (VALTREX) 500 MG tablet Take 1 tablet (500 mg total) by mouth daily. 30 tablet 0   venlafaxine XR (EFFEXOR-XR) 150 MG 24 hr capsule TAKE 1 CAPSULE BY MOUTH EVERY DAY WITH BREAKFAST 90 capsule 3   VYVANSE 70 MG capsule Take 1 capsule (70 mg total) by  mouth daily. 90 capsule 0   No current facility-administered medications for this visit.    PHYSICAL EXAMINATION: ECOG PERFORMANCE STATUS: 1 - Symptomatic but completely ambulatory  Vitals:   10/22/22 0911  BP: (!) 156/88  Pulse: 98  Resp: 18  Temp: 97.9 F (36.6 C)  SpO2: 99%   Filed Weights   10/22/22 0911  Weight: 188 lb 3.2 oz (85.4 kg)    BREAST: No palpable masses or nodules in either right or left breasts. No palpable axillary supraclavicular or infraclavicular adenopathy no breast tenderness or nipple discharge. (exam performed in the  presence of a chaperone)  LABORATORY DATA:  I have reviewed the data as listed    Latest Ref Rng & Units 10/22/2022    8:45 AM 10/22/2021    8:23 AM 10/23/2020    9:13 AM  CMP  Glucose 70 - 99 mg/dL 80  104  94   BUN 8 - 23 mg/dL _0 Creatinine 0.44 - 1.00 mg/dL 0.71  0.67  0.68   Sodium 135 - 145 mmol/L 142  144  138   Potassium 3.5 - 5.1 mmol/L 4.1  4.0  4.2   Chloride 98 - 111 mmol/L 109  110  105   CO2 22 - 32 mmol/L _1 Calcium 8.9 - 10.3 mg/dL 8.7  8.9  9.1   Total Protein 6.5 - 8.1 g/dL 6.5  6.5  6.6   Total Bilirubin 0.3 - 1.2 mg/dL 0.4  0.6  0.5   Alkaline Phos 38 - 126 U/L 89  99  114   AST 15 - 41 U/L _2 ALT 0 - 44 U/L 19  20  32     Lab Results  Component Value Date   WBC 4.3 10/22/2022   HGB 13.7 10/22/2022   HCT 41.6 10/22/2022   MCV 92.7 10/22/2022   PLT 153 10/22/2022   NEUTROABS 2.3 10/22/2022    ASSESSMENT & PLAN:  Malignant neoplasm of upper-outer quadrant of right breast in female, estrogen receptor positive ([place]) History of 2 primary right breast cancers: on observation since completed 7 years [facility] 10-2014, clinically doing well.    Osteopenia: 01/10/2020: Bone density: T score -2.5: Osteoporosis, recommended calcium vitamin D and bisphosphonate therapy   Breast cancer surveillance:  Hysterectomy with oophorectomy 2008 prophylactic left mastectomy, bilateral breast reconstructions No role of imaging since she had bilateral mastectomies  Cloudiness in the head and decreased memory: I discussed the importance of exercise and improving memory along with doing puzzles and reading books.  She will go ahead and get started on that program. The symptoms started after her COVID infection.  Iron deficiency anemia Iron deficiency related to prior gastric bypass surgery 10/19/2018: Iron saturation 10%, hemoglobin 11.9, MCV 84.5, ferritin 8 Prior treatment: IV iron: 2014 and 2016, November 2020 Lab review  10/22/2021: Hemoglobin  13.6, iron saturation 40%, ferritin 41, CMP normal 10/22/2022: Hemoglobin 13.7, ANC 2.3, platelets 153  Return to clinic in 1 year with labs and follow-up  Orders Placed This Encounter  Procedures   CBC with Differential ([facility] Only)    Standing Status:   Future    Standing Expiration Date:   10/23/2023   CMP ([facility] only)    Standing Status:   Future    Standing Expiration Date:   10/23/2023   Ferritin    Standing Status:   Future    Standing  Expiration Date:   10/22/2023   Iron and Iron Binding Capacity ([facility] only)    Standing Status:   Future    Standing Expiration Date:   10/23/2023   Thyroid Panel With TSH    Standing Status:   Future    Standing Expiration Date:   10/22/2023   The patient has a good understanding of the overall plan. she agrees with it. she will call with any problems that may develop before the next visit here. Total time spent: 30 mins including face to face time and time spent for planning, charting and co-ordination of care   Harriette Ohara, MD 10/22/22    I Gardiner Coins am scribing for Dr. Lindi Adie  I have reviewed the above documentation for accuracy and completeness, and I agree with the above.

## 2022-10-22 NOTE — Assessment & Plan Note (Signed)
History of 2 primary right breast cancers: on observation since completed7 years [facility] 10-2014, clinically doing well.   Osteopenia: 01/10/2020: Bone density: T score -2.5: Osteoporosis, recommended calcium vitamin D and bisphosphonate therapy  Breast cancer surveillance:  Hysterectomy with oophorectomy 2008 prophylactic left mastectomy, bilateral breast reconstructions No role of imaging since she had bilateral mastectomies

## 2022-10-22 NOTE — Assessment & Plan Note (Signed)
Iron deficiency related to prior gastric bypass surgery 10/19/2018: Iron saturation 10%, hemoglobin 11.9, MCV 84.5, ferritin 8 Prior treatment: IV iron: 2014 and 2016, November 2020 Lab review 10/22/2021: Hemoglobin 13.6, iron saturation 40%, ferritin 41, CMP normal

## 2023-01-05 ENCOUNTER — Encounter: Payer: Self-pay | Admitting: Internal Medicine

## 2023-01-13 ENCOUNTER — Other Ambulatory Visit: Payer: Self-pay | Admitting: Family Medicine

## 2023-01-13 DIAGNOSIS — F900 Attention-deficit hyperactivity disorder, predominantly inattentive type: Secondary | ICD-10-CM

## 2023-01-13 NOTE — Telephone Encounter (Signed)
Refill request last apt 05/26/22 next apt 06/07/23.

## 2023-02-03 ENCOUNTER — Encounter: Payer: Self-pay | Admitting: Family Medicine

## 2023-02-03 DIAGNOSIS — B001 Herpesviral vesicular dermatitis: Secondary | ICD-10-CM

## 2023-02-03 MED ORDER — VALACYCLOVIR HCL 500 MG PO TABS: 500.0000 mg | ORAL_TABLET | Freq: Every day | ORAL | 0 refills | Status: AC

## 2023-02-03 NOTE — Telephone Encounter (Signed)
Is it ok to refill medication? 

## 2023-03-06 NOTE — Progress Notes (Signed)
Cardiology Office Note:    Date:  03/18/2023   ID:  Christy [name], DOB 24-Sep-1956, MRN [id number]  PCP:  Denita Lung, MD  Cardiologist:  Peter Martinique, MD  Electrophysiologist:  None   Referring MD: Denita Lung, MD   Chief Complaint: follow-up of SVT  History of Present Illness:    Christy [name] is a 67 y.o. female with a history of SVT, hypothyroidism, mild mitral stenosis on Echo in 10/2020, s/p gastric bypass, iron deficiency anemia, ADD on Adderall and Vyvanse, breast cancer s/p chemo and radiation as well as bilateral mastectomy and reconstruction, and s/p gastric banding who is followed by Dr. Martinique and presents today for routine follow-up.    Patient is a Psychiatry PA and works at [employer] She was seen in the [facility] ED in 02/2020 with palpitations and was noted to be in SVT. She eventually converted to normal sinus rhythm after multiple vagal maneuver attempts and administration of 2 L of IV fluids. TSH and electrolytes normal. Zio monitor was placed and she was discharged home. Last Echo from 2009 showed normal EF. She was seen by Dr. Martinique for follow-up in 03/2020. At that visit, she reported several episodes of tachycardia since Summer of 2020. Symptoms were typically short lived (less than 10 minutes) and resolved independently. 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 She noted being on Adderall for many years and trouble functioning without it. Based on history of monitor results, it was felt to most likely be PAT exacerbated by caffeine and Adderall/Vyvance. Patient was advised to eliminate caffeine and Diltiazem was added in the hopes that with the changes Adderall/Vyvanse could be continued. She was advised to follow-up in 3 months. She has continued to have short lived episodes of palpitations but much improved on Diltiazem. Echo was done in 10/2020 for further evaluation of dyspnea and showed LVEF of  55-60% with no regional wall motion abnormalities and mild aortic stenosis.  Patient was last seen by Dr. Martinique in 11/2021 at which time she was doing well and stated palpitations were well controlled on Diltiazem.  Patient presents today for follow-up. She continues to have palpitations but they are stable overall. Patient's bigger concern today is fatigue and dyspnea on exertion when she is walking down the halls at work. He reports shortness of breath with activity for about 4-5 months. She also reports some lower extremity edema over this same time frame. She also reports some weight gain at home (although weight essential stable from last visit in 11/2021 on office scales). She denies any shortness of breath at rest. No orthopnea or PND. No chest pain. She reports occasional mild lightheadedness if she gets really short of breath when ambulating. No syncope. She also reports intermittent leg cramps which she has had for a long time - the only thing that seems to help is pickle juice.  Past Medical History:  Diagnosis Date   ADD (attention deficit disorder)    Allergy    rhinitis   Anemia    Arthritis    osteo   Breast cancer ([place]) [id number]   Breast , skin - basal    Complication of anesthesia 2004   gastric bypass [facility] ,  acetyl succ-, hard time awaking, placed back on vent for a couiple hour   Dyspnea    GERD (gastroesophageal reflux disease)    History of hiatal hernia  History of kidney stones    passed   Hypothyroid    Internal hemorrhoids    Obesity    Pneumonia    Tubular adenoma of colon     Past Surgical History:  Procedure Laterality Date   BREAST LUMPECTOMY Right    CESAREAN SECTION     x 2   CHOLECYSTECTOMY     COLONOSCOPY  07/2013   ESOPHAGOGASTRODUODENOSCOPY     GASTRIC BYPASS     JOINT REPLACEMENT     KNEE ARTHROSCOPY Right    x 3   LAPAROSCOPIC APPENDECTOMY N/A 04/03/2015   Procedure: APPENDECTOMY LAPAROSCOPIC;  Surgeon: Autumn Messing III,  MD;  Location: [facility];  Service: General;  Laterality: N/A;   LITHOTRIPSY     x3   MASTECTOMY Bilateral    with lymph node dissection  bil reconstruction 2009   TOTAL KNEE ARTHROPLASTY Right 12/18/2013   Procedure: TOTAL KNEE ARTHROPLASTY;  Surgeon: Kerin Salen, MD;  Location: [facility];  Service: Orthopedics;  Laterality: Right;   TOTAL KNEE ARTHROPLASTY Left 06/29/2018   TOTAL KNEE ARTHROPLASTY Left 06/29/2018   Procedure: LEFT TOTAL KNEE ARTHROPLASTY;  Surgeon: Frederik Pear, MD;  Location: [facility];  Service: Orthopedics;  Laterality: Left;   VAGINAL HYSTERECTOMY      Current Medications: Current Meds  Medication Sig   alendronate (FOSAMAX) 70 MG tablet Take 1 tablet (70 mg total) by mouth once a week. Take with a full glass of water on an empty stomach.   B Complex-C (B-COMPLEX WITH VITAMIN C) tablet Take 1 tablet by mouth daily.   cetirizine (ZYRTEC) 10 MG tablet    Cholecalciferol (VITAMIN D3) 5000 units CAPS Take 5,000 Units by mouth daily.    clobetasol cream (TEMOVATE) [id number] % Apply 1 application topically 2 (two) times daily.   cyclobenzaprine (FLEXERIL) 10 MG tablet Take 10 mg by mouth 3 (three) times daily as needed for muscle spasms.   cycloSPORINE (RESTASIS) 0.05 % ophthalmic emulsion Place 2 drops into both eyes daily.   fexofenadine (ALLEGRA) 180 MG tablet Take 180 mg by mouth daily as needed for allergies or rhinitis.   hydrochlorothiazide (MICROZIDE) 12.5 MG capsule Take 1 capsule (12.5 mg total) by mouth daily.   ibuprofen (ADVIL) 600 MG tablet    levothyroxine (SYNTHROID) 50 MCG tablet Take 1 tablet (50 mcg total) by mouth daily.   Multiple Vitamin (MULTIVITAMIN) capsule Take 3 capsules by mouth daily.    valACYclovir (VALTREX) 500 MG tablet Take 1 tablet (500 mg total) by mouth daily.   venlafaxine XR (EFFEXOR-XR) 150 MG 24 hr capsule TAKE 1 CAPSULE BY MOUTH EVERY DAY WITH BREAKFAST   VYVANSE 70 MG capsule Take 1 capsule (70 mg total) by mouth daily.   [DISCONTINUED]  diltiazem (CARDIZEM CD) 240 MG 24 hr capsule Take 1 capsule (240 mg total) by mouth daily.     Allergies:   Morphine and related, Levaquin [levofloxacin in d5w], and Tylenol [acetaminophen]   Social History   Socioeconomic History   Marital status: Divorced    Spouse name: Not on file   Number of children: 2   Years of education: Not on file   Highest education level: Not on file  Occupational History   Occupation: PA behavioral health    Employer: [employer]  Tobacco Use   Smoking status: Never   Smokeless tobacco: Never  Vaping Use   Vaping Use: Never used  Substance and Sexual Activity   Alcohol use: Yes  Alcohol/week: 7.0 standard drinks of alcohol    Types: 7 Glasses of wine per week   Drug use: No   Sexual activity: Yes    Birth control/protection: Surgical  Other Topics Concern   Not on file  Social History Narrative   Not on file   Social Determinants of Health   Financial Resource Strain: Not on file  Food Insecurity: Not on file  Transportation Needs: Not on file  Physical Activity: Not on file  Stress: Not on file  Social Connections: Not on file     Family History: The patient's family history includes Atrial fibrillation in her mother; Breast cancer in her paternal aunt; COPD in her father; Colon polyps in her mother. There is no history of Colon cancer, Esophageal cancer, Rectal cancer, or Stomach cancer.  ROS:   Please see the history of present illness.     EKGs/Labs/Other Studies Reviewed:    The following studies were reviewed:  Echocardiogram 11/20/2020:  1. Technically difficult study. Left ventricular ejection fraction, by  estimation, is 55 to 60%. The left ventricle has normal function. The left  ventricle has no regional wall motion abnormalities. Left ventricular  diastolic parameters are  indeterminate.   2. Right ventricular systolic function is normal. The right ventricular  size is normal. Tricuspid  regurgitation signal is inadequate for assessing  PA pressure.   3. The mitral valve is abnormal. No evidence of mitral valve  regurgitation. Mild mitral stenosis. Moderate mitral annular  calcification. MG 5 mmHg at HR 102 bpm.   4. The aortic valve is tricuspid. Aortic valve regurgitation is not  visualized. No aortic stenosis is present.   5. Aortic dilatation noted. 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   6. The inferior vena cava is normal in size with greater than 50%  respiratory variability, suggesting right atrial pressure of 3 mmHg.    EKG:  EKG ordered today. EKG personally reviewed and demonstrates normal sinus rhythm, rate 89 bpm, with no acute ST/T changes. Left axis deviation. Normal PR and QRS intervals. QTc 472 ms.  Recent Labs: 10/22/2022: ALT 19; BUN 7; Creatinine 0.71; Hemoglobin 13.7; Platelet Count 153; Potassium 4.1; Sodium 142; TSH 3.594  Recent Lipid Panel    Component Value Date/Time   CHOL 154 10/22/2022 0855   CHOL 121 10/14/2017 1541   TRIG 55 10/22/2022 0855   TRIG 51 10/14/2017 1541   HDL 55 10/22/2022 0855   HDL 49 10/14/2017 1541   CHOLHDL 2.8 10/22/2022 0855   VLDL 11 10/22/2022 0855   LDLCALC 88 10/22/2022 0855   LDLCALC 62 10/14/2017 1541    Physical Exam:    Vital Signs: BP (!) 152/84   Pulse 89   Ht 5' 6.5" (1.689 m)   Wt 193 lb (87.5 kg)   BMI 30.68 kg/m     Wt Readings from Last 3 Encounters:  03/18/23 193 lb (87.5 kg)  10/22/22 188 lb 3.2 oz (85.4 kg)  07/06/22 186 lb (84.4 kg)     General: 67 y.o. Caucasian female in no acute distress. HEENT: Normocephalic and atraumatic. Sclera clear. EOMs intact. Neck: Supple. No JVD. Heart: RRR. Distinct S1 and S2. No murmurs, gallops, or rubs. Radial and distal pedal pulses 2+ and equal bilaterally. Lungs: No increased work of breathing. Clear to ausculation bilaterally. No wheezes, rhonchi, or rales.  Abdomen: Soft, non-distended, and non-tender to  palpation.  Extremities: Trace lower extremity edema bilaterally. Skin: Warm and dry. Neuro:  Alert and oriented x3. No focal deficits. Psych: Normal affect. Responds appropriately.   Assessment:    1. Dyspnea on exertion   2. Palpitations   3. Paroxysmal SVT (supraventricular tachycardia)   4. Mitral valve stenosis, unspecified etiology   5. Primary hypertension   6. Other fatigue   7. Leg cramps   8. Pre-op evaluation     Plan:    Dyspnea on Exertion Lower Extremity Edema Patient reports dyspnea on exertion and lower extremity edema for the last couple of months as well as some weight gain. However, no dyspnea at rest, orthopnea, or PND. - Trace edema on exam but otherwise appears euvolemic. - Will check BNP and BMET.  - Will update Echo. - Will go ahead and start low dose HCTZ for additional BP control. If BNP significantly elevated, will also likely add short course of Lasix. - If BNP and Echo are unremarkable, will likely order coronary CTA to rule out dyspnea and fatigue as an anginal equivalent.  Palpitations Paroxysmal SVT Zio monitor in 02/2020 showed one 4 beat run of NSVT. 85 episodes of SVT longest 8.2 seconds. Initiated with PAC.  Felt most likely to be PAT.  - She continues to have palpitations but overall stable. - Continue Diltiazem to 240mg  daily. - Of note, she is on Adderral / Vyvanse which she has been on many years and has trouble functioning without. However, palpitations have been well controlled on Diltiazem so she has been able to remain on these.   Mild Mitral Stenosis Note on Echo in 10/2020. - Can reassess on repeat Echo.  Hypertension  BP elevated at 152/84. Systolic BP usually in the 140s to 150s.  - Continue Diltiazem 240mg  daily. - Will add HCTZ 12.5mg  daily. - Will repeat BMET in 1 weeks after starting HCTZ.  Fatigue Patient also reports significant fatigue lately. TSH normal in 09/2022.  - Will check Echo as above. If normal, will  likely order coronary CTA to rule out fatigue and dyspnea as an anginal equivalent.  Leg Cramps She reports intermittent leg cramps which she has had for a long time. She states she only thing that helps is pickle juice. - Will check Magnesium and BMET today.  Pre-Op Evaluation Patient reports she is planning on having a lipoma on her left wrist removed under local anesthesia at the end of April. She is also planning on having a few dental extractions in the coming months but this has not been scheduled yet. Echo is being ordered as above for further evaluation of new dyspnea on exertion. Further pre-op recommendations to follow Echo results.   Disposition: Follow up in 6 months.   Medication Adjustments/Labs and Tests Ordered: Current medicines are reviewed at length with the patient today.  Concerns regarding medicines are outlined above.  Orders Placed This Encounter  Procedures   Magnesium   Basic metabolic panel   Brain natriuretic peptide   Basic metabolic panel   EKG [id number]   ECHOCARDIOGRAM COMPLETE   Meds ordered this encounter  Medications   diltiazem (CARDIZEM CD) 240 MG 24 hr capsule    Sig: Take 1 capsule (240 mg total) by mouth daily.    Dispense:  90 capsule    Refill:  3   hydrochlorothiazide (MICROZIDE) 12.5 MG capsule    Sig: Take 1 capsule (12.5 mg total) by mouth daily.    Dispense:  15 capsule    Refill:  3    Patient Instructions  Medication Instructions:  START HYROCHLOROTHIAZIDE  12.5MG  DAILY  *If you need a refill on your cardiac medications before your next appointment, please call your pharmacy*   Lab Work: MAG,BMET AND BNP TODAY AND BMET IN 1 WEEK If you have labs (blood work) drawn today and your tests are completely normal, you will receive your results only by: [facility] (if you have MyChart) OR A paper copy in the mail If you have any lab test that is abnormal or we need to change your treatment, we will call you to review the  results.  Testing/Procedures: Echocardiogram - Your physician has requested that you have an echocardiogram. Echocardiography is a painless test that uses sound waves to create images of your heart. It provides your doctor with information about the size and shape of your heart and how well your heart's chambers and valves are working. This procedure takes approximately one hour. There are no restrictions for this procedure.     Follow-Up: At [facility], you and your health needs are our priority.  As part of our continuing mission to provide you with exceptional heart care, we have created designated Provider Care Teams.  These Care Teams include your primary Cardiologist (physician) and Advanced Practice Providers (APPs -  Physician Assistants and Nurse Practitioners) who all work together to provide you with the care you need, when you need it.  We recommend signing up for the patient portal called "MyChart".  Sign up information is provided on this After Visit Summary.  MyChart is used to connect with patients for Virtual Visits (Telemedicine).  Patients are able to view lab/test results, encounter notes, upcoming appointments, etc.  Non-urgent messages can be sent to your provider as well.   To learn more about what you can do with MyChart, go to [email or web address].    Your next appointment:   6 month(s)  Provider:   Peter Martinique, MD     Other Instructions    Signed, Darreld Mclean, PA-C  03/18/2023 9:02 AM    [place]

## 2023-03-18 ENCOUNTER — Encounter: Payer: Self-pay | Admitting: Student

## 2023-03-18 ENCOUNTER — Ambulatory Visit: Payer: PRIVATE HEALTH INSURANCE | Attending: Student | Admitting: Student

## 2023-03-18 VITALS — BP 152/84 | HR 89 | Ht 66.5 in | Wt 193.0 lb

## 2023-03-18 DIAGNOSIS — I050 Rheumatic mitral stenosis: Secondary | ICD-10-CM

## 2023-03-18 DIAGNOSIS — I471 Supraventricular tachycardia, unspecified: Secondary | ICD-10-CM

## 2023-03-18 DIAGNOSIS — R002 Palpitations: Secondary | ICD-10-CM

## 2023-03-18 DIAGNOSIS — R252 Cramp and spasm: Secondary | ICD-10-CM

## 2023-03-18 DIAGNOSIS — Z01818 Encounter for other preprocedural examination: Secondary | ICD-10-CM

## 2023-03-18 DIAGNOSIS — I10 Essential (primary) hypertension: Secondary | ICD-10-CM

## 2023-03-18 DIAGNOSIS — R5383 Other fatigue: Secondary | ICD-10-CM

## 2023-03-18 DIAGNOSIS — R0609 Other forms of dyspnea: Secondary | ICD-10-CM | POA: Diagnosis not present

## 2023-03-18 MED ORDER — HYDROCHLOROTHIAZIDE 12.5 MG PO CAPS: 12.5000 mg | ORAL_CAPSULE | Freq: Every day | ORAL | 3 refills | Status: DC

## 2023-03-18 MED ORDER — DILTIAZEM HCL ER COATED BEADS 240 MG PO CP24: 240.0000 mg | ORAL_CAPSULE | Freq: Every day | ORAL | 3 refills | Status: DC

## 2023-03-18 NOTE — Patient Instructions (Signed)
Medication Instructions:  START HYROCHLOROTHIAZIDE 12.5MG  DAILY  *If you need a refill on your cardiac medications before your next appointment, please call your pharmacy*   Lab Work: MAG,BMET AND BNP TODAY AND BMET IN 1 WEEK If you have labs (blood work) drawn today and your tests are completely normal, you will receive your results only by: Ona (if you have MyChart) OR A paper copy in the mail If you have any lab test that is abnormal or we need to change your treatment, we will call you to review the results.  Testing/Procedures: Echocardiogram - Your physician has requested that you have an echocardiogram. Echocardiography is a painless test that uses sound waves to create images of your heart. It provides your doctor with information about the size and shape of your heart and how well your heart's chambers and valves are working. This procedure takes approximately one hour. There are no restrictions for this procedure.     Follow-Up: At [facility], you and your health needs are our priority.  As part of our continuing mission to provide you with exceptional heart care, we have created designated Provider Care Teams.  These Care Teams include your primary Cardiologist (physician) and Advanced Practice Providers (APPs -  Physician Assistants and Nurse Practitioners) who all work together to provide you with the care you need, when you need it.  We recommend signing up for the patient portal called "MyChart".  Sign up information is provided on this After Visit Summary.  MyChart is used to connect with patients for Virtual Visits (Telemedicine).  Patients are able to view lab/test results, encounter notes, upcoming appointments, etc.  Non-urgent messages can be sent to your provider as well.   To learn more about what you can do with MyChart, go to [email or web address].    Your next appointment:   6 month(s)  Provider:   Peter Martinique, MD     Other  Instructions

## 2023-03-19 LAB — BASIC METABOLIC PANEL
BUN/Creatinine Ratio: 9 — ABNORMAL LOW (ref 12–28)
BUN: 5 mg/dL — ABNORMAL LOW (ref 8–27)
CO2: 24 mmol/L (ref 20–29)
Calcium: 9 mg/dL (ref 8.7–10.3)
Chloride: 102 mmol/L (ref 96–106)
Creatinine, Ser: 0.58 mg/dL (ref 0.57–1.00)
Glucose: 102 mg/dL — ABNORMAL HIGH (ref 70–99)
Potassium: 4.3 mmol/L (ref 3.5–5.2)
Sodium: 139 mmol/L (ref 134–144)
eGFR: 100 mL/min/{1.73_m2} (ref >59)

## 2023-03-19 LAB — BRAIN NATRIURETIC PEPTIDE: BNP: 126.1 pg/mL — ABNORMAL HIGH (ref 0.0–100.0)

## 2023-03-19 LAB — MAGNESIUM: Magnesium: 2.3 mg/dL (ref 1.6–2.3)

## 2023-03-22 ENCOUNTER — Other Ambulatory Visit: Payer: Self-pay | Admitting: *Deleted

## 2023-03-22 DIAGNOSIS — I471 Supraventricular tachycardia, unspecified: Secondary | ICD-10-CM

## 2023-03-22 DIAGNOSIS — R5383 Other fatigue: Secondary | ICD-10-CM

## 2023-03-22 DIAGNOSIS — R0609 Other forms of dyspnea: Secondary | ICD-10-CM

## 2023-03-22 DIAGNOSIS — R002 Palpitations: Secondary | ICD-10-CM

## 2023-03-22 DIAGNOSIS — I10 Essential (primary) hypertension: Secondary | ICD-10-CM

## 2023-03-22 NOTE — Telephone Encounter (Signed)
Glad to hear. Thank you for the update! And thank you for helping email her the lab order.  ~Herson Prichard

## 2023-03-26 LAB — BASIC METABOLIC PANEL
BUN/Creatinine Ratio: 15 (ref 12–28)
BUN: 12 mg/dL (ref 8–27)
CO2: 26 mmol/L (ref 20–29)
Calcium: 9.6 mg/dL (ref 8.7–10.3)
Chloride: 95 mmol/L — ABNORMAL LOW (ref 96–106)
Creatinine, Ser: 0.8 mg/dL (ref 0.57–1.00)
Glucose: 84 mg/dL (ref 70–99)
Potassium: 3.8 mmol/L (ref 3.5–5.2)
Sodium: 137 mmol/L (ref 134–144)
eGFR: 81 mL/min/{1.73_m2} (ref >59)

## 2023-04-05 ENCOUNTER — Telehealth: Payer: Self-pay

## 2023-04-05 DIAGNOSIS — E039 Hypothyroidism, unspecified: Secondary | ICD-10-CM

## 2023-04-05 MED ORDER — LEVOTHYROXINE SODIUM 50 MCG PO TABS: 50.0000 ug | ORAL_TABLET | Freq: Every day | ORAL | 0 refills | Status: DC

## 2023-04-05 NOTE — Telephone Encounter (Addendum)
Received fax from pharmacy requesting refill for levothyroxine 50 mcg tabs. Last sent on 08/24/22 90 tabs with 1 refill. Pt has physical scheduled for 06/10/23.

## 2023-04-27 ENCOUNTER — Ambulatory Visit (HOSPITAL_COMMUNITY): Payer: PRIVATE HEALTH INSURANCE

## 2023-05-11 ENCOUNTER — Telehealth: Payer: Self-pay | Admitting: Family Medicine

## 2023-05-11 ENCOUNTER — Other Ambulatory Visit: Payer: Self-pay | Admitting: Family Medicine

## 2023-05-11 DIAGNOSIS — F900 Attention-deficit hyperactivity disorder, predominantly inattentive type: Secondary | ICD-10-CM

## 2023-05-11 MED ORDER — LISDEXAMFETAMINE DIMESYLATE 70 MG PO CAPS: 70.0000 mg | ORAL_CAPSULE | Freq: Every day | ORAL | 0 refills | Status: DC

## 2023-05-11 NOTE — Telephone Encounter (Signed)
[facility] to let you know that they can not fill 90 day Supply of Vyvanse, they will be filling for 30 day supply

## 2023-05-12 ENCOUNTER — Encounter: Payer: Self-pay | Admitting: Family Medicine

## 2023-05-25 ENCOUNTER — Ambulatory Visit (HOSPITAL_COMMUNITY): Payer: PRIVATE HEALTH INSURANCE

## 2023-06-07 ENCOUNTER — Encounter: Payer: PRIVATE HEALTH INSURANCE | Admitting: Family Medicine

## 2023-06-10 ENCOUNTER — Encounter: Payer: Self-pay | Admitting: Family Medicine

## 2023-06-10 ENCOUNTER — Ambulatory Visit (INDEPENDENT_AMBULATORY_CARE_PROVIDER_SITE_OTHER): Payer: PRIVATE HEALTH INSURANCE | Admitting: Family Medicine

## 2023-06-10 VITALS — BP 144/90 | HR 63 | Temp 98.1°F | Ht 65.0 in | Wt 191.4 lb

## 2023-06-10 DIAGNOSIS — M810 Age-related osteoporosis without current pathological fracture: Secondary | ICD-10-CM | POA: Diagnosis not present

## 2023-06-10 DIAGNOSIS — C50411 Malignant neoplasm of upper-outer quadrant of right female breast: Secondary | ICD-10-CM

## 2023-06-10 DIAGNOSIS — Z170 Estrogen receptor positive status [ER+]: Secondary | ICD-10-CM

## 2023-06-10 DIAGNOSIS — H9190 Unspecified hearing loss, unspecified ear: Secondary | ICD-10-CM

## 2023-06-10 DIAGNOSIS — Z9884 Bariatric surgery status: Secondary | ICD-10-CM

## 2023-06-10 DIAGNOSIS — E039 Hypothyroidism, unspecified: Secondary | ICD-10-CM

## 2023-06-10 DIAGNOSIS — Z0000 Encounter for general adult medical examination without abnormal findings: Secondary | ICD-10-CM

## 2023-06-10 DIAGNOSIS — T451X5A Adverse effect of antineoplastic and immunosuppressive drugs, initial encounter: Secondary | ICD-10-CM

## 2023-06-10 DIAGNOSIS — I471 Supraventricular tachycardia, unspecified: Secondary | ICD-10-CM

## 2023-06-10 DIAGNOSIS — Z9849 Cataract extraction status, unspecified eye: Secondary | ICD-10-CM

## 2023-06-10 DIAGNOSIS — F341 Dysthymic disorder: Secondary | ICD-10-CM

## 2023-06-10 DIAGNOSIS — R232 Flushing: Secondary | ICD-10-CM

## 2023-06-10 DIAGNOSIS — I050 Rheumatic mitral stenosis: Secondary | ICD-10-CM

## 2023-06-10 DIAGNOSIS — Z96651 Presence of right artificial knee joint: Secondary | ICD-10-CM

## 2023-06-10 DIAGNOSIS — D509 Iron deficiency anemia, unspecified: Secondary | ICD-10-CM

## 2023-06-10 DIAGNOSIS — F900 Attention-deficit hyperactivity disorder, predominantly inattentive type: Secondary | ICD-10-CM

## 2023-06-10 MED ORDER — HYDROCHLOROTHIAZIDE 12.5 MG PO CAPS
12.5000 mg | ORAL_CAPSULE | Freq: Every day | ORAL | 3 refills | Status: DC
Start: 2023-06-10 — End: 2023-10-14

## 2023-06-10 MED ORDER — VENLAFAXINE HCL ER 150 MG PO CP24: 150.0000 mg | ORAL_CAPSULE | Freq: Every day | ORAL | 3 refills | Status: DC

## 2023-06-10 MED ORDER — AMPHETAMINE-DEXTROAMPHETAMINE 30 MG PO TABS: 30.0000 mg | ORAL_TABLET | Freq: Three times a day (TID) | ORAL | 0 refills | Status: DC

## 2023-06-10 MED ORDER — LEVOTHYROXINE SODIUM 50 MCG PO TABS: 50.0000 ug | ORAL_TABLET | Freq: Every day | ORAL | 0 refills | Status: DC

## 2023-06-10 MED ORDER — AMPHETAMINE-DEXTROAMPHETAMINE 30 MG PO TABS
30.0000 mg | ORAL_TABLET | Freq: Three times a day (TID) | ORAL | 0 refills | Status: DC
Start: 2023-06-10 — End: 2023-06-10

## 2023-06-10 NOTE — Progress Notes (Signed)
Complete physical exam  Patient: Christy [name]   DOB: 1956/10/23   67 y.o. Female  MRN: [id number]  Subjective:    Chief Complaint  Patient presents with   Annual Exam    Christy [name] is a 67 y.o. female who presents today for a complete physical exam. She reports consuming a general diet. The patient does not participate in regular exercise at present. She generally feels fairly well. She reports sleeping fairly well. She does have long ADHD however her insurance will not cover Vyvanse which she has taken in the past.  She does have a history of osteoporosis and plans to follow-up with her oncologist concerning this.  She does have some difficulty with fluid retention.  Continues on Synthroid without difficulty.  She is also taking Cardizem and HCTZ with no difficulty.  Continues on Effexor for her dysthymia.  She has tried Fosamax but is having difficulty staying on a regular regimen of this.  She tends to be forgetful.  She does wear hearing aids.  She does have a history of SVT.  She has a remote history of gastric bypass.   Most recent fall risk assessment:    06/10/2023    3:54 PM  Fall Risk   Falls in the past year? 0  Number falls in past yr: 0  Injury with Fall? 0  Risk for fall due to : No Fall Risks  Follow up Falls evaluation completed     Most recent depression screenings:    06/10/2023    3:52 PM 08/18/2021    3:58 PM  PHQ 2/9 Scores  PHQ - 2 Score 3 0  PHQ- 9 Score 7     Vision:Within last year and Dental: Receives regular dental care    Patient Care Team: Ronnald Nian, MD as PCP - General (Family Medicine) [place], Peter M, MD as PCP - Cardiology (Cardiology)   Outpatient Medications Prior to Visit  Medication Sig   alendronate (FOSAMAX) 70 MG tablet Take 1 tablet (70 mg total) by mouth once a week. Take with a full glass of water on an empty stomach.   B Complex-C (B-COMPLEX WITH VITAMIN C) tablet Take 1 tablet by mouth daily.   cetirizine  (ZYRTEC) 10 MG tablet    Cholecalciferol (VITAMIN D3) 5000 units CAPS Take 5,000 Units by mouth daily.    clobetasol cream (TEMOVATE) 0.05 % Apply 1 application topically 2 (two) times daily.   cyclobenzaprine (FLEXERIL) 10 MG tablet Take 10 mg by mouth 3 (three) times daily as needed for muscle spasms.   cycloSPORINE (RESTASIS) 0.05 % ophthalmic emulsion Place 2 drops into both eyes daily.   diltiazem (CARDIZEM CD) 240 MG 24 hr capsule Take 1 capsule (240 mg total) by mouth daily.   fexofenadine (ALLEGRA) 180 MG tablet Take 180 mg by mouth daily as needed for allergies or rhinitis.   hydrochlorothiazide (MICROZIDE) 12.5 MG capsule Take 1 capsule (12.5 mg total) by mouth daily.   ibuprofen (ADVIL) 600 MG tablet    levothyroxine (SYNTHROID) 50 MCG tablet Take 1 tablet (50 mcg total) by mouth daily.   lisdexamfetamine (VYVANSE) 70 MG capsule Take 1 capsule (70 mg total) by mouth daily.   Multiple Vitamin (MULTIVITAMIN) capsule Take 3 capsules by mouth daily.    valACYclovir (VALTREX) 500 MG tablet Take 1 tablet (500 mg total) by mouth daily.   venlafaxine XR (EFFEXOR-XR) 150 MG 24 hr capsule TAKE 1 CAPSULE BY MOUTH EVERY DAY WITH BREAKFAST  No facility-administered medications prior to visit.    Review of Systems  All other systems reviewed and are negative.         Objective:  Alert and in no distress. Tympanic membranes and canals are normal.  Hearing aids are present.  Pharyngeal area is normal. Neck is supple without adenopathy or thyromegaly. Cardiac exam shows a regular sinus rhythm without murmurs or gallops. Lungs are clear to auscultation.    BP (!) 144/90   Pulse 63   Temp 98.1 F (36.7 C) (Oral)   Ht 5\' 5"  (1.651 m)   Wt 191 lb 6.4 oz (86.8 kg)   SpO2 98% Comment: room air  BMI 31.85 kg/m    Physical Exam  Routine general medical examination at a health care facility  SVT (supraventricular tachycardia)  Hypothyroidism, unspecified type  Hearing loss,  unspecified hearing loss type, unspecified laterality  Osteoporosis, unspecified osteoporosis type, unspecified pathological fracture presence - Plan: DG Bone Density  Attention deficit hyperactivity disorder (ADHD), predominantly inattentive type - Plan: amphetamine-dextroamphetamine (ADDERALL) 30 MG tablet, DISCONTINUED: amphetamine-dextroamphetamine (ADDERALL) 30 MG tablet  H/O gastric bypass  Iron deficiency anemia, unspecified iron deficiency anemia type  Status post total right knee replacement using cement  Status post cataract extraction, unspecified laterality  Mitral valve stenosis, unspecified etiology  Immunization History  Administered Date(s) Administered   Fluad Quad(high Dose 65+) 10/22/2021   Influenza Nasal 10/26/2014   Influenza,inj,Quad PF,6+ Mos 09/19/2015, 09/28/2016   Influenza-Unspecified 09/27/2017, 10/06/2018, 10/24/2018, 09/29/2019   PNEUMOCOCCAL CONJUGATE-20 08/18/2021   Tdap 07/04/2013, 05/26/2022   Zoster Recombinat (Shingrix) 10/21/2017, 12/30/2017   Zoster, Live 11/03/2016    Health Maintenance  Topic Date Due   COVID-19 Vaccine (1) Never done   Hepatitis C Screening  Never done   INFLUENZA VACCINE  07/29/2023   Colonoscopy  10/03/2024   DTaP/Tdap/Td (3 - Td or Tdap) 05/26/2032   Pneumonia Vaccine 86+ Years old  Completed   DEXA SCAN  Completed   Zoster Vaccines- Shingrix  Completed   HPV VACCINES  Aged Out   MAMMOGRAM  Discontinued    Discussed continuing her present medication regimen.  She will follow-up with blood work when she sees her oncologist.  I will try her on Adderall 30 mg 3 times daily to see if that will help with her ADD.  She did do well on Vyvanse but it caused too much.  She will continue to take the HCTZ on an as-needed basis.  Continue on Effexor. Problem List Items Addressed This Visit     Attention deficit hyperactivity disorder (ADHD), predominantly inattentive type   Relevant Medications    amphetamine-dextroamphetamine (ADDERALL) 30 MG tablet   H/O gastric bypass   Hearing loss   Hypothyroidism   Iron deficiency anemia   Osteoporosis   Relevant Orders   DG Bone Density   S/P TKR (total knee replacement) using cement   Status post cataract extraction   SVT (supraventricular tachycardia)   Other Visit Diagnoses     Routine general medical examination at a health care facility    -  Primary   Mitral valve stenosis, unspecified etiology          No follow-ups on file.     Sharlot Gowda, MD

## 2023-06-24 ENCOUNTER — Ambulatory Visit (HOSPITAL_COMMUNITY): Payer: PRIVATE HEALTH INSURANCE

## 2023-07-22 ENCOUNTER — Ambulatory Visit (HOSPITAL_COMMUNITY): Payer: PRIVATE HEALTH INSURANCE | Attending: Cardiovascular Disease

## 2023-07-22 ENCOUNTER — Encounter: Payer: Self-pay | Admitting: Hematology and Oncology

## 2023-07-22 DIAGNOSIS — R0609 Other forms of dyspnea: Secondary | ICD-10-CM | POA: Diagnosis present

## 2023-07-22 LAB — ECHOCARDIOGRAM COMPLETE
Area-P 1/2: 2.93 cm2
S' Lateral: 3.1 cm

## 2023-08-26 ENCOUNTER — Other Ambulatory Visit: Payer: Self-pay | Admitting: Family Medicine

## 2023-08-26 DIAGNOSIS — F900 Attention-deficit hyperactivity disorder, predominantly inattentive type: Secondary | ICD-10-CM

## 2023-08-26 NOTE — Telephone Encounter (Signed)
Is this okay to refill? 

## 2023-09-14 NOTE — Progress Notes (Deleted)
Cardiology Office Note:    Date:  09/14/2023   ID:  Christy [name], DOB 06-26-1956, MRN [id number]  PCP:  Ronnald Nian, MD  Cardiologist:  Desyre Calma [place], MD  Electrophysiologist:  None   Referring MD: Ronnald Nian, MD   Chief Complaint: follow-up of SVT  History of Present Illness:    Christy [name] is a 67 y.o. female with a history of SVT, hypothyroidism, mild mitral stenosis on Echo in 10/2020, s/p gastric bypass, iron deficiency anemia, ADD on Adderall and Vyvanse, breast cancer s/p chemo and radiation as well as bilateral mastectomy and reconstruction, and s/p gastric banding who is followed by Dr. [place] and presents today for routine follow-up.    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 She was seen in the [facility] ED in 02/2020 with palpitations and was noted to be in SVT. She eventually converted to normal sinus rhythm after multiple vagal maneuver attempts and administration of 2 L of IV fluids. TSH and electrolytes normal. Zio monitor was placed and she was discharged home. Last Echo from 2009 showed normal EF. She was seen by Dr. [place] for follow-up in 03/2020. At that visit, she reported several episodes of tachycardia since Summer of 2020. Symptoms were typically short lived (less than 10 minutes) and resolved independently. 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 She noted being on Adderall for many years and trouble functioning without it. Based on history of monitor results, it was felt to most likely be PAT exacerbated by caffeine and Adderall/Vyvance. Patient was advised to eliminate caffeine and Diltiazem was added in the hopes that with the changes Adderall/Vyvanse could be continued. She was advised to follow-up in 3 months. She has continued to have short lived episodes of palpitations but much improved on Diltiazem. Echo was done in 10/2020 for further evaluation of dyspnea and showed LVEF of  55-60% with no regional wall motion abnormalities and mild aortic stenosis.  Patient presents today for follow-up. She continues to have palpitations but they are stable overall. Patient's bigger concern today is fatigue and dyspnea on exertion when she is walking down the halls at work. He reports shortness of breath with activity for about 4-5 months. She also reports some lower extremity edema over this same time frame. She also reports some weight gain at home (although weight essential stable from last visit in 11/2021 on office scales). She denies any shortness of breath at rest. No orthopnea or PND. No chest pain. She reports occasional mild lightheadedness if she gets really short of breath when ambulating. No syncope. She also reports intermittent leg cramps which she has had for a long time - the only thing that seems to help is pickle juice.  Echo was done and was unremarkable.   Past Medical History:  Diagnosis Date   ADD (attention deficit disorder)    Allergy    rhinitis   Anemia    Arthritis    osteo   Breast cancer (HCC) 3329,5188   Breast , skin - basal    Complication of anesthesia 2004   gastric bypass [facility] ,  acetyl succ-, hard time awaking, placed back on vent for a couiple hour   Dyspnea    GERD (gastroesophageal reflux disease)    History of hiatal hernia    History of kidney stones    passed   Hypothyroid    Internal hemorrhoids  Obesity    Pneumonia    Tubular adenoma of colon     Past Surgical History:  Procedure Laterality Date   BREAST LUMPECTOMY Right    CESAREAN SECTION     x 2   CHOLECYSTECTOMY     COLONOSCOPY  07/2013   ESOPHAGOGASTRODUODENOSCOPY     GASTRIC BYPASS     JOINT REPLACEMENT     KNEE ARTHROSCOPY Right    x 3   LAPAROSCOPIC APPENDECTOMY N/A 04/03/2015   Procedure: APPENDECTOMY LAPAROSCOPIC;  Surgeon: Chevis Pretty III, MD;  Location: [facility];  Service: General;  Laterality: N/A;   LITHOTRIPSY     x3   MASTECTOMY  Bilateral    with lymph node dissection  bil reconstruction 2009   TOTAL KNEE ARTHROPLASTY Right 12/18/2013   Procedure: TOTAL KNEE ARTHROPLASTY;  Surgeon: Nestor Lewandowsky, MD;  Location: [facility] OR;  Service: Orthopedics;  Laterality: Right;   TOTAL KNEE ARTHROPLASTY Left 06/29/2018   TOTAL KNEE ARTHROPLASTY Left 06/29/2018   Procedure: LEFT TOTAL KNEE ARTHROPLASTY;  Surgeon: Gean Birchwood, MD;  Location: [facility] OR;  Service: Orthopedics;  Laterality: Left;   VAGINAL HYSTERECTOMY      Current Medications: No outpatient medications have been marked as taking for the 09/20/23 encounter (Appointment) with [place], Kevona Lupinacci M, MD.     Allergies:   Morphine and codeine, Levaquin [levofloxacin in d5w], and Tylenol [acetaminophen]   Social History   Socioeconomic History   Marital status: Divorced    Spouse name: Not on file   Number of children: 2   Years of education: Not on file   Highest education level: Not on file  Occupational History   Occupation: PA behavioral health    Employer: [employer]  Tobacco Use   Smoking status: Never   Smokeless tobacco: Never  Vaping Use   Vaping status: Never Used  Substance and Sexual Activity   Alcohol use: Yes    Alcohol/week: 7.0 standard drinks of alcohol    Types: 7 Glasses of wine per week   Drug use: No   Sexual activity: Yes    Birth control/protection: Surgical  Other Topics Concern   Not on file  Social History Narrative   Not on file   Social Determinants of Health   Financial Resource Strain: Not on file  Food Insecurity: Not on file  Transportation Needs: Not on file  Physical Activity: Not on file  Stress: Not on file  Social Connections: Not on file     Family History: The patient's family history includes Atrial fibrillation in her mother; Breast cancer in her paternal aunt; COPD in her father; Colon polyps in her mother. There is no history of Colon cancer, Esophageal cancer, Rectal cancer, or Stomach  cancer.  ROS:   Please see the history of present illness.     EKGs/Labs/Other Studies Reviewed:    The following studies were reviewed:  Echocardiogram 11/20/2020:  1. Technically difficult study. Left ventricular ejection fraction, by  estimation, is 55 to 60%. The left ventricle has normal function. The left  ventricle has no regional wall motion abnormalities. Left ventricular  diastolic parameters are  indeterminate.   2. Right ventricular systolic function is normal. The right ventricular  size is normal. Tricuspid regurgitation signal is inadequate for assessing  PA pressure.   3. The mitral valve is abnormal. No evidence of mitral valve  regurgitation. Mild mitral stenosis. Moderate mitral annular  calcification. MG 5 mmHg at HR 102 bpm.  4. The aortic valve is tricuspid. Aortic valve regurgitation is not  visualized. No aortic stenosis is present.   5. Aortic dilatation noted. 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   6. The inferior vena cava is normal in size with greater than 50%  respiratory variability, suggesting right atrial pressure of 3 mmHg.   Echo 07/22/23: IMPRESSIONS     1. Left ventricular ejection fraction, by estimation, is 60 to 65%. The  left ventricle has normal function. The left ventricle has no regional  wall motion abnormalities. There is mild left ventricular hypertrophy.  Left ventricular diastolic parameters  are consistent with Grade I diastolic dysfunction (impaired relaxation).  Elevated left ventricular end-diastolic pressure.   2. Right ventricular systolic function is normal. The right ventricular  size is normal. There is normal pulmonary artery systolic pressure.   3. The mitral valve is normal in structure. No evidence of mitral valve  regurgitation. No evidence of mitral stenosis.   4. The aortic valve is tricuspid. Aortic valve regurgitation is not  visualized. No aortic stenosis is present.   5. The  inferior vena cava is normal in size with greater than 50%  respiratory variability, suggesting right atrial pressure of 3 mmHg.   EKG:  EKG ordered today. EKG personally reviewed and demonstrates normal sinus rhythm, rate 89 bpm, with no acute ST/T changes. Left axis deviation. Normal PR and QRS intervals. QTc 472 ms.  Recent Labs: 10/22/2022: ALT 19; Hemoglobin 13.7; Platelet Count 153; TSH 3.594 03/18/2023: BNP 126.1; Magnesium 2.3 03/25/2023: BUN 12; Creatinine, Ser 0.80; Potassium 3.8; Sodium 137  Recent Lipid Panel    Component Value Date/Time   CHOL 154 10/22/2022 0855   CHOL 121 10/14/2017 1541   TRIG 55 10/22/2022 0855   TRIG 51 10/14/2017 1541   HDL 55 10/22/2022 0855   HDL 49 10/14/2017 1541   CHOLHDL 2.8 10/22/2022 0855   VLDL 11 10/22/2022 0855   LDLCALC 88 10/22/2022 0855   LDLCALC 62 10/14/2017 1541    Physical Exam:    Vital Signs: There were no vitals taken for this visit.    Wt Readings from Last 3 Encounters:  06/10/23 191 lb 6.4 oz (86.8 kg)  03/18/23 193 lb (87.5 kg)  10/22/22 188 lb 3.2 oz (85.4 kg)     General: 67 y.o. Caucasian female in no acute distress. HEENT: Normocephalic and atraumatic. Sclera clear. EOMs intact. Neck: Supple. No JVD. Heart: RRR. Distinct S1 and S2. No murmurs, gallops, or rubs. Radial and distal pedal pulses 2+ and equal bilaterally. Lungs: No increased work of breathing. Clear to ausculation bilaterally. No wheezes, rhonchi, or rales.  Abdomen: Soft, non-distended, and non-tender to palpation.  Extremities: Trace lower extremity edema bilaterally. Skin: Warm and dry. Neuro: Alert and oriented x3. No focal deficits. Psych: Normal affect. Responds appropriately.   Assessment:    No diagnosis found.   Plan:    Dyspnea on Exertion Lower Extremity Edema Patient reports dyspnea on exertion and lower extremity edema for the last couple of months as well as some weight gain. However, no dyspnea at rest, orthopnea, or  PND. - Trace edema on exam but otherwise appears euvolemic. - Will check BNP and BMET.  - Will update Echo. - Will go ahead and start low dose HCTZ for additional BP control. If BNP significantly elevated, will also likely add short course of Lasix. - If BNP and Echo are unremarkable, will likely order coronary CTA to rule out  dyspnea and fatigue as an anginal equivalent.  Palpitations Paroxysmal SVT Zio monitor in 02/2020 showed one 4 beat run of NSVT. 85 episodes of SVT longest 8.2 seconds. Initiated with PAC.  Felt most likely to be PAT.  - She continues to have palpitations but overall stable. - Continue Diltiazem to 240mg  daily. - Of note, she is on Adderral / Vyvanse which she has been on many years and has trouble functioning without. However, palpitations have been well controlled on Diltiazem so she has been able to remain on these.   Mild Mitral Stenosis Note on Echo in 10/2020. - Can reassess on repeat Echo.  Hypertension  BP elevated at 152/84. Systolic BP usually in the 140s to 150s.  - Continue Diltiazem 240mg  daily. - Will add HCTZ 12.5mg  daily. - Will repeat BMET in 1 weeks after starting HCTZ.  Fatigue Patient also reports significant fatigue lately. TSH normal in 09/2022.  - Will check Echo as above. If normal, will likely order coronary CTA to rule out fatigue and dyspnea as an anginal equivalent.  Leg Cramps She reports intermittent leg cramps which she has had for a long time. She states she only thing that helps is pickle juice. - Will check Magnesium and BMET today.  Pre-Op Evaluation Patient reports she is planning on having a lipoma on her left wrist removed under local anesthesia at the end of April. She is also planning on having a few dental extractions in the coming months but this has not been scheduled yet. Echo is being ordered as above for further evaluation of new dyspnea on exertion. Further pre-op recommendations to follow Echo  results.   Disposition: Follow up in 6 months.   Medication Adjustments/Labs and Tests Ordered: Current medicines are reviewed at length with the patient today.  Concerns regarding medicines are outlined above.  No orders of the defined types were placed in this encounter.  No orders of the defined types were placed in this encounter.   There are no Patient Instructions on file for this visit.   Signed, Ayat Drenning [place], MD  09/14/2023 1:07 PM    [facility]

## 2023-09-20 ENCOUNTER — Ambulatory Visit: Payer: PRIVATE HEALTH INSURANCE | Admitting: Cardiology

## 2023-10-12 NOTE — Progress Notes (Unsigned)
Cardiology Clinic Note   Patient Name: Christy [name] Date of Encounter: 10/14/2023  Primary Care Provider:  Ronnald Nian, MD Primary Cardiologist:  Peter Swaziland, MD  Patient Profile    Christy [name] 67 year old female presents the clinic today for follow-up evaluation of her dyspnea.  Past Medical History    Past Medical History:  Diagnosis Date   ADD (attention deficit disorder)    Allergy    rhinitis   Anemia    Arthritis    osteo   Breast cancer (HCC) [id number]   Breast , skin - basal    Complication of anesthesia 2004   gastric bypass [facility] ,  acetyl succ-, hard time awaking, placed back on vent for a couiple hour   Dyspnea    GERD (gastroesophageal reflux disease)    History of hiatal hernia    History of kidney stones    passed   Hypothyroid    Internal hemorrhoids    Obesity    Pneumonia    Tubular adenoma of colon    Past Surgical History:  Procedure Laterality Date   BREAST LUMPECTOMY Right    CESAREAN SECTION     x 2   CHOLECYSTECTOMY     COLONOSCOPY  07/2013   ESOPHAGOGASTRODUODENOSCOPY     GASTRIC BYPASS     JOINT REPLACEMENT     KNEE ARTHROSCOPY Right    x 3   LAPAROSCOPIC APPENDECTOMY N/A 04/03/2015   Procedure: APPENDECTOMY LAPAROSCOPIC;  Surgeon: Chevis Pretty III, MD;  Location: [facility];  Service: General;  Laterality: N/A;   LITHOTRIPSY     x3   MASTECTOMY Bilateral    with lymph node dissection  bil reconstruction 2009   TOTAL KNEE ARTHROPLASTY Right 12/18/2013   Procedure: TOTAL KNEE ARTHROPLASTY;  Surgeon: Nestor Lewandowsky, MD;  Location: [facility] OR;  Service: Orthopedics;  Laterality: Right;   TOTAL KNEE ARTHROPLASTY Left 06/29/2018   TOTAL KNEE ARTHROPLASTY Left 06/29/2018   Procedure: LEFT TOTAL KNEE ARTHROPLASTY;  Surgeon: Gean Birchwood, MD;  Location: [facility] OR;  Service: Orthopedics;  Laterality: Left;   VAGINAL HYSTERECTOMY      Allergies  Allergies  Allergen Reactions   Morphine And Codeine Itching, Swelling and  Other (See Comments)    SWELLING REACTION UNSPECIFIED    Levaquin [Levofloxacin In D5w] Itching   Tylenol [Acetaminophen] Other (See Comments)    Pt instructed NOT to have tylenol after extensive gastric bypass    History of Present Illness    Christy [name] has a PMH of palpitations, paroxysmal SVT, DOE, mitral valve stenosis, HTN, fatigue, lower extremity cramping, status post gastric bypass, iron deficiency anemia, and ADD on Adderall/Vyvanse.  Her PMH also includes breast cancer and she is status post radiation and chemotherapy as well as bilateral mastectomy.  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  She was seen in the emergency department at [facility] 3/21 with palpitations.  She was noted to be in SVT.  She converted to sinus rhythm after multiple vagal maneuvers and administration of 2 L of IV fluids.  Her TSH and electrolytes were noted to be normal.  Echocardiogram 2009 showed normal EF.  She followed up with Dr. Swaziland 4/21.  She reported several episodes of tachycardia since the summer 2020.  Her symptoms were typically less than 10 minutes and would resolve without intervention.  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  She had been on Adderall for many years and had trouble functioning without it.  It was felt that her paroxysmal atrial tachycardia was exacerbated by caffeine and Adderall/Vyvanse.  She was advised to eliminate caffeine and diltiazem was added to her medication regimen.  Her ADD medications were continued.  Follow-up was planned for 3 months.  Her echo 11/21 showed LVEF of 55-60%, mild aortic stenosis and no regional wall motion abnormalities.  She was seen in follow-up by Dr. Swaziland 12/22 and was doing well.  Her palpitations have been much better controlled with diltiazem.  She was seen in follow-up by Marjie Skiff, PA-C 03/18/2023.  She continued to have palpitations which were overall stable.  Her  bigger concern was with fatigue and DOE.  She noted increased DOE with walking halls at work.  She reported symptoms for about 4-5 months.  She did note sodium lower extremity edema over the same timeframe.  She did note some weight gain.  However, her weight was stable from her previous visit.  She denied shortness of breath at rest.  She did note occasional mild lightheadedness.  She denied syncope.  She reported intermittent leg cramps which were chronic.  Pickle juice was the only thing that seemed to help with her lower extremity cramping.  BMP x 2 was ordered and showed stable renal function BMP showed slightly elevated value of 126.1.  Magnesium was normal.  Echocardiogram 07/22/2023 showed normal LVEF, G1 DD, and no significant valvular abnormalities.  She was started on HCTZ 12.5 mg daily.  She presents to the clinic today for follow-up evaluation and states she has been taking her hydrochlorothiazide as needed.  Her blood pressure today is 128/88.  She continues to notice occasional episodes of palpitations that are short-lived lasting for around 10 minutes.  Episodes happen 1-2 times per week.  We reviewed her EKG.  We discussed her echocardiogram.  She does report a period of palpitations/episode of palpitations today.  She feels this was related to increased stress with coming to the appointment and holding her breath.    I will continue her current medication regimen and refill her hydrochlorothiazide as well as her diltiazem.  Will plan follow-up in 12 months.  She is now working at [employer] in the emergency department 2 shifts per month.  Today she denies chest pain, shortness of breath, lower extremity edema, fatigue, melena, hematuria, hemoptysis, diaphoresis, weakness, presyncope, syncope, orthopnea, and PND.   Home Medications    Prior to Admission medications   Medication Sig Start Date End Date Taking? Authorizing Provider  alendronate (FOSAMAX) 70 MG tablet Take 1 tablet (70 mg  total) by mouth once a week. Take with a full glass of water on an empty stomach. 04/16/22   Serena Croissant, MD  amphetamine-dextroamphetamine (ADDERALL) 30 MG tablet Take 1 tablet by mouth 3 (three) times daily. 08/26/23   Ronnald Nian, MD  B Complex-C (B-COMPLEX WITH VITAMIN C) tablet Take 1 tablet by mouth daily.    [provider]  cetirizine (ZYRTEC) 10 MG tablet  04/14/19   [provider]  Cholecalciferol (VITAMIN D3) 5000 units CAPS Take 5,000 Units by mouth daily.     [provider]  clobetasol cream (TEMOVATE) 0.05 % Apply 1 application topically 2 (two) times daily. 06/04/22   Ronnald Nian, MD  cyclobenzaprine (FLEXERIL) 10 MG tablet Take 10 mg by mouth 3 (three) times daily as needed for muscle spasms.    [provider]  cycloSPORINE (RESTASIS) 0.05 % ophthalmic emulsion Place 2 drops into both eyes daily.    [provider]  diltiazem (CARDIZEM CD) 240 MG 24 hr capsule Take 1 capsule (240 mg total) by mouth daily. 03/18/23 03/12/24  Corrin Parker, PA-C  fexofenadine (ALLEGRA) 180 MG tablet Take 180 mg by mouth daily as needed for allergies or rhinitis.    [provider]  hydrochlorothiazide (MICROZIDE) 12.5 MG capsule Take 1 capsule (12.5 mg total) by mouth daily. 06/10/23 09/08/23  Ronnald Nian, MD  ibuprofen (ADVIL) 600 MG tablet  04/14/19   [provider]  levothyroxine (SYNTHROID) 50 MCG tablet Take 1 tablet (50 mcg total) by mouth daily. 06/10/23   Ronnald Nian, MD  Multiple Vitamin (MULTIVITAMIN) capsule Take 3 capsules by mouth daily.     [provider]  valACYclovir (VALTREX) 500 MG tablet Take 1 tablet (500 mg total) by mouth daily. 02/03/23   Ronnald Nian, MD  venlafaxine XR (EFFEXOR-XR) 150 MG 24 hr capsule Take 1 capsule (150 mg total) by mouth daily with breakfast. 06/10/23   Ronnald Nian, MD    Family History    Family History  Problem Relation Age of Onset   COPD Father    Colon  polyps Mother    Atrial fibrillation Mother    Breast cancer Paternal Aunt    Colon cancer Neg Hx    Esophageal cancer Neg Hx    Rectal cancer Neg Hx    Stomach cancer Neg Hx    She indicated that her mother is alive. She indicated that her father is deceased. She indicated that her brother is alive. She indicated that the status of her paternal aunt is unknown. She indicated that the status of her neg hx is unknown.  Social History    Social History   Socioeconomic History   Marital status: Divorced    Spouse name: Not on file   Number of children: 2   Years of education: Not on file   Highest education level: Not on file  Occupational History   Occupation: PA behavioral health    Employer: [employer]  Tobacco Use   Smoking status: Never   Smokeless tobacco: Never  Vaping Use   Vaping status: Never Used  Substance and Sexual Activity   Alcohol use: Yes    Alcohol/week: 7.0 standard drinks of alcohol    Types: 7 Glasses of wine per week   Drug use: No   Sexual activity: Yes    Birth control/protection: Surgical  Other Topics Concern   Not on file  Social History Narrative   Not on file   Social Determinants of Health   Financial Resource Strain: Not on file  Food Insecurity: Not on file  Transportation Needs: Not on file  Physical Activity: Not on file  Stress: Not on file  Social Connections: Not on file  Intimate Partner Violence: Not on file     Review of Systems    General:  No chills, fever, night sweats or weight changes.  Cardiovascular:  No chest pain, dyspnea on exertion, edema, orthopnea, palpitations, paroxysmal nocturnal dyspnea. Dermatological: No rash, lesions/masses Respiratory: No cough, dyspnea Urologic: No hematuria, dysuria Abdominal:   No nausea, vomiting, diarrhea, bright red blood per rectum, melena, or hematemesis Neurologic:  No visual changes, wkns, changes in mental status. All other systems reviewed and are  otherwise negative except as noted above.  Physical Exam    VS:  BP  128/88 (BP Location: Left Arm, Patient Position: Sitting, Cuff Size: Normal)   Pulse 100   Ht 5\' 6"  (1.676 m)   Wt 187 lb (84.8 kg)   SpO2 95%   BMI 30.18 kg/m  , BMI Body mass index is 30.18 kg/m. GEN: Well nourished, well developed, in no acute distress. HEENT: normal. Neck: Supple, no JVD, carotid bruits, or masses. Cardiac: RRR, no murmurs, rubs, or gallops. No clubbing, cyanosis, edema.  Radials/DP/PT 2+ and equal bilaterally.  Respiratory:  Respirations regular and unlabored, clear to auscultation bilaterally. GI: Soft, nontender, nondistended, BS + x 4. MS: no deformity or atrophy. Skin: warm and dry, no rash. Neuro:  Strength and sensation are intact. Psych: Normal affect.  Accessory Clinical Findings    Recent Labs: 10/22/2022: ALT 19; Hemoglobin 13.7; Platelet Count 153; TSH 3.594 03/18/2023: BNP 126.1; Magnesium 2.3 03/25/2023: BUN 12; Creatinine, Ser 0.80; Potassium 3.8; Sodium 137   Recent Lipid Panel    Component Value Date/Time   CHOL 154 10/22/2022 0855   CHOL 121 10/14/2017 1541   TRIG 55 10/22/2022 0855   TRIG 51 10/14/2017 1541   HDL 55 10/22/2022 0855   HDL 49 10/14/2017 1541   CHOLHDL 2.8 10/22/2022 0855   VLDL 11 10/22/2022 0855   LDLCALC 88 10/22/2022 0855   LDLCALC 62 10/14/2017 1541         ECG personally reviewed by me today- EKG Interpretation Date/Time:  Thursday October 14 2023 15:41:11 EDT Ventricular Rate:  100 PR Interval:  138 QRS Duration:  88 QT Interval:  358 QTC Calculation: 461 R Axis:   68  Text Interpretation: Sinus rhythm with Premature atrial complexes When compared with ECG of 01-Jun-2018 12:28, Premature atrial complexes are now Present Questionable change in QRS axis Confirmed by Edd Fabian [phone]) on 10/14/2023 3:43:23 PM   IMPRESSIONS     1. Left ventricular ejection fraction, by estimation, is 60 to 65%. The  left ventricle has normal  function. The left ventricle has no regional  wall motion abnormalities. There is mild left ventricular hypertrophy.  Left ventricular diastolic parameters  are consistent with Grade I diastolic dysfunction (impaired relaxation).  Elevated left ventricular end-diastolic pressure.   2. Right ventricular systolic function is normal. The right ventricular  size is normal. There is normal pulmonary artery systolic pressure.   3. The mitral valve is normal in structure. No evidence of mitral valve  regurgitation. No evidence of mitral stenosis.   4. The aortic valve is tricuspid. Aortic valve regurgitation is not  visualized. No aortic stenosis is present.   5. The inferior vena cava is normal in size with greater than 50%  respiratory variability, suggesting right atrial pressure of 3 mmHg.   FINDINGS   Left Ventricle: Left ventricular ejection fraction, by estimation, is 60  to 65%. The left ventricle has normal function. The left ventricle has no  regional wall motion abnormalities. The left ventricular internal cavity  size was normal in size. There is   mild left ventricular hypertrophy. Left ventricular diastolic parameters  are consistent with Grade I diastolic dysfunction (impaired relaxation).  Elevated left ventricular end-diastolic pressure.   Right Ventricle: The right ventricular size is normal. No increase in  right ventricular wall thickness. Right ventricular systolic function is  normal. There is normal pulmonary artery systolic pressure. The tricuspid  regurgitant velocity is 1.81 m/s, and   with an assumed right atrial pressure of 3 mmHg, the estimated right  ventricular systolic pressure is 16.1 mmHg.  Left Atrium: Left atrial size was normal in size.   Right Atrium: Right atrial size was normal in size.   Pericardium: There is no evidence of pericardial effusion.   Mitral Valve: The mitral valve is normal in structure. Mild mitral annular  calcification. No  evidence of mitral valve regurgitation. No evidence of  mitral valve stenosis.   Tricuspid Valve: The tricuspid valve is normal in structure. Tricuspid  valve regurgitation is trivial. No evidence of tricuspid stenosis.   Aortic Valve: The aortic valve is tricuspid. Aortic valve regurgitation is  not visualized. No aortic stenosis is present.   Pulmonic Valve: The pulmonic valve was normal in structure. Pulmonic valve  regurgitation is not visualized. No evidence of pulmonic stenosis.   Aorta: The aortic root is normal in size and structure.   Venous: The inferior vena cava is normal in size with greater than 50%  respiratory variability, suggesting right atrial pressure of 3 mmHg.   IAS/Shunts: No atrial level shunt detected by color flow Doppler.       Assessment & Plan   1.  DOE-stable.  Weight today 187.6.  Maintaining physical activity.  Echocardiogram reassuring.  Details above. Increase physical activity as tolerated Continue hydrochlorothiazide  Palpitations, paroxysmal SVT-stable.  Continue to be overall well-controlled with medical therapy.  Continues to avoid caffeine. Continue diltiazem Avoid triggers caffeine, chocolate, EtOH, dehydration etc.  History of mitral valve stenosis-echocardiogram showed no evidence of mitral valve regurgitation or stenosis.  Mild mitral annular valve calcification.  EF reassuring. Increase physical activity as tolerated Repeat echocardiogram when clinically indicated  Lower extremity cramping-2+ PT and DP pulses.  Magnesium noted to be normal.  Does note relief with pickle juice.  Appears to be muscular in nature.  Disposition: Follow-up with Dr. Swaziland or me in 9-12 months.   Thomasene Ripple. Tomer Chalmers NP-C     10/14/2023, 4:01 PM [facility] [address] Office [phone] Fax [phone]    I spent 14 minutes examining this patient, reviewing medications, and using patient centered shared  decision making involving her cardiac care.   I spent greater than 20 minutes reviewing her past medical history,  medications, and prior cardiac tests.

## 2023-10-14 ENCOUNTER — Ambulatory Visit: Payer: Medicare Other | Attending: Cardiology | Admitting: General Practice

## 2023-10-14 ENCOUNTER — Encounter: Payer: Self-pay | Admitting: General Practice

## 2023-10-14 ENCOUNTER — Encounter: Payer: Self-pay | Admitting: Hematology and Oncology

## 2023-10-14 VITALS — BP 128/88 | HR 100 | Ht 66.0 in | Wt 187.0 lb

## 2023-10-14 DIAGNOSIS — T451X5A Adverse effect of antineoplastic and immunosuppressive drugs, initial encounter: Secondary | ICD-10-CM | POA: Diagnosis present

## 2023-10-14 DIAGNOSIS — R252 Cramp and spasm: Secondary | ICD-10-CM | POA: Insufficient documentation

## 2023-10-14 DIAGNOSIS — R232 Flushing: Secondary | ICD-10-CM | POA: Diagnosis present

## 2023-10-14 DIAGNOSIS — R002 Palpitations: Secondary | ICD-10-CM | POA: Diagnosis present

## 2023-10-14 DIAGNOSIS — R0609 Other forms of dyspnea: Secondary | ICD-10-CM | POA: Insufficient documentation

## 2023-10-14 DIAGNOSIS — T451X5D Adverse effect of antineoplastic and immunosuppressive drugs, subsequent encounter: Secondary | ICD-10-CM

## 2023-10-14 DIAGNOSIS — I050 Rheumatic mitral stenosis: Secondary | ICD-10-CM | POA: Insufficient documentation

## 2023-10-14 MED ORDER — HYDROCHLOROTHIAZIDE 12.5 MG PO CAPS: 12.5000 mg | ORAL_CAPSULE | Freq: Every day | ORAL | 3 refills | Status: AC

## 2023-10-14 MED ORDER — DILTIAZEM HCL ER COATED BEADS 240 MG PO CP24: 240.0000 mg | ORAL_CAPSULE | Freq: Every day | ORAL | 3 refills | Status: DC

## 2023-10-14 NOTE — Patient Instructions (Signed)
Medication Instructions:  The current medical regimen is effective;  continue present plan and medications as directed. Please refer to the Current Medication list given to you today.  *If you need a refill on your cardiac medications before your next appointment, please call your pharmacy*  Lab Work: NONE  Other Instructions MAINTAIN HYDRATION INCREASE PHYSICAL ACTIVITY  Please try to avoid these triggers: Do not use any products that have nicotine or tobacco in them. These include cigarettes, e-cigarettes, and chewing tobacco. If you need help quitting, ask your doctor. Eat heart-healthy foods. Talk with your doctor about the right eating plan for you. Exercise regularly as told by your doctor. Stay hydrated Do not drink alcohol, Caffeine or chocolate. Lose weight if you are overweight. Do not use drugs, including cannabis    Follow-Up: At [facility], you and your health needs are our priority.  As part of our continuing mission to provide you with exceptional heart care, we have created designated Provider Care Teams.  These Care Teams include your primary Cardiologist (physician) and Advanced Practice Providers (APPs -  Physician Assistants and Nurse Practitioners) who all work together to provide you with the care you need, when you need it.  Your next appointment:   12 month(s)  Provider:   Peter Swaziland, MD  or Edd Fabian, FNP or Marjie Skiff, PA-C

## 2023-10-25 ENCOUNTER — Inpatient Hospital Stay: Payer: Medicare Other | Attending: Hematology and Oncology

## 2023-10-25 ENCOUNTER — Other Ambulatory Visit: Payer: PRIVATE HEALTH INSURANCE

## 2023-10-25 ENCOUNTER — Inpatient Hospital Stay (HOSPITAL_BASED_OUTPATIENT_CLINIC_OR_DEPARTMENT_OTHER): Payer: Medicare Other | Admitting: Hematology and Oncology

## 2023-10-25 ENCOUNTER — Other Ambulatory Visit: Payer: Self-pay | Admitting: Hematology and Oncology

## 2023-10-25 ENCOUNTER — Encounter: Payer: Self-pay | Admitting: Hematology and Oncology

## 2023-10-25 ENCOUNTER — Other Ambulatory Visit: Payer: Self-pay | Admitting: *Deleted

## 2023-10-25 ENCOUNTER — Ambulatory Visit: Payer: PRIVATE HEALTH INSURANCE | Admitting: Hematology and Oncology

## 2023-10-25 VITALS — BP 150/87 | HR 91 | Temp 97.5°F | Resp 18 | Ht 66.0 in | Wt 190.0 lb

## 2023-10-25 DIAGNOSIS — Z853 Personal history of malignant neoplasm of breast: Secondary | ICD-10-CM | POA: Insufficient documentation

## 2023-10-25 DIAGNOSIS — Z90722 Acquired absence of ovaries, bilateral: Secondary | ICD-10-CM | POA: Diagnosis not present

## 2023-10-25 DIAGNOSIS — M8580 Other specified disorders of bone density and structure, unspecified site: Secondary | ICD-10-CM | POA: Insufficient documentation

## 2023-10-25 DIAGNOSIS — Z9013 Acquired absence of bilateral breasts and nipples: Secondary | ICD-10-CM | POA: Diagnosis not present

## 2023-10-25 DIAGNOSIS — Z170 Estrogen receptor positive status [ER+]: Secondary | ICD-10-CM | POA: Diagnosis not present

## 2023-10-25 DIAGNOSIS — Z79899 Other long term (current) drug therapy: Secondary | ICD-10-CM | POA: Insufficient documentation

## 2023-10-25 DIAGNOSIS — D509 Iron deficiency anemia, unspecified: Secondary | ICD-10-CM

## 2023-10-25 DIAGNOSIS — Z90710 Acquired absence of both cervix and uterus: Secondary | ICD-10-CM | POA: Diagnosis not present

## 2023-10-25 DIAGNOSIS — C50411 Malignant neoplasm of upper-outer quadrant of right female breast: Secondary | ICD-10-CM | POA: Diagnosis not present

## 2023-10-25 LAB — CMP (CANCER CENTER ONLY)
ALT: 21 U/L (ref 0–44)
AST: 24 U/L (ref 15–41)
Albumin: 3.7 g/dL (ref 3.5–5.0)
Alkaline Phosphatase: 87 U/L (ref 38–126)
Anion gap: 6 (ref 5–15)
BUN: 8 mg/dL (ref 8–23)
CO2: 28 mmol/L (ref 22–32)
Calcium: 9.1 mg/dL (ref 8.9–10.3)
Chloride: 106 mmol/L (ref 98–111)
Creatinine: 0.64 mg/dL (ref 0.44–1.00)
GFR, Estimated: >60 mL/min (ref >60)
Glucose, Bld: 101 mg/dL — ABNORMAL HIGH (ref 70–99)
Potassium: 4.1 mmol/L (ref 3.5–5.1)
Sodium: 140 mmol/L (ref 135–145)
Total Bilirubin: 0.4 mg/dL (ref 0.3–1.2)
Total Protein: 6.5 g/dL (ref 6.5–8.1)

## 2023-10-25 LAB — LIPID PANEL
Cholesterol: 173 mg/dL (ref 0–200)
HDL: 58 mg/dL (ref >40)
LDL Cholesterol: 101 mg/dL — ABNORMAL HIGH (ref 0–99)
Total CHOL/HDL Ratio: 3 {ratio}
Triglycerides: 69 mg/dL (ref <150)
VLDL: 14 mg/dL (ref 0–40)

## 2023-10-25 LAB — CBC WITH DIFFERENTIAL (CANCER CENTER ONLY)
Abs Immature Granulocytes: 0.01 10*3/uL (ref 0.00–0.07)
Basophils Absolute: 0.1 10*3/uL (ref 0.0–0.1)
Basophils Relative: 1 %
Eosinophils Absolute: 0.2 10*3/uL (ref 0.0–0.5)
Eosinophils Relative: 4 %
HCT: 43.4 % (ref 36.0–46.0)
Hemoglobin: 13.9 g/dL (ref 12.0–15.0)
Immature Granulocytes: 0 %
Lymphocytes Relative: 25 %
Lymphs Abs: 1.2 10*3/uL (ref 0.7–4.0)
MCH: 29.9 pg (ref 26.0–34.0)
MCHC: 32 g/dL (ref 30.0–36.0)
MCV: 93.3 fL (ref 80.0–100.0)
Monocytes Absolute: 0.4 10*3/uL (ref 0.1–1.0)
Monocytes Relative: 9 %
Neutro Abs: 2.8 10*3/uL (ref 1.7–7.7)
Neutrophils Relative %: 61 %
Platelet Count: 165 10*3/uL (ref 150–400)
RBC: 4.65 MIL/uL (ref 3.87–5.11)
RDW: 13.2 % (ref 11.5–15.5)
WBC Count: 4.6 10*3/uL (ref 4.0–10.5)
nRBC: 0 % (ref 0.0–0.2)

## 2023-10-25 LAB — IRON AND IRON BINDING CAPACITY (CC-WL,HP ONLY)
Iron: 75 ug/dL (ref 28–170)
Saturation Ratios: 21 % (ref 10.4–31.8)
TIBC: 365 ug/dL (ref 250–450)
UIBC: 290 ug/dL (ref 148–442)

## 2023-10-25 LAB — TSH: TSH: 7.607 u[IU]/mL — ABNORMAL HIGH (ref 0.350–4.500)

## 2023-10-25 LAB — FOLATE: Folate: >40 ng/mL (ref >5.9)

## 2023-10-25 NOTE — Assessment & Plan Note (Addendum)
History of 2 primary right breast cancers: on surveillance since she completed 7 years [facility] 10-2014, clinically doing well.    Osteopenia: 01/10/2020: Bone density: T score -2.5: Osteoporosis, recommended calcium vitamin D and bisphosphonate therapy   Breast cancer surveillance:  Hysterectomy with oophorectomy 2008 prophylactic left mastectomy, bilateral breast reconstructions No role of imaging since she had bilateral mastectomies   Cloudiness in the head and decreased memory: She will continue to work on exercises.  She got herself a puppy which is helping her.

## 2023-10-25 NOTE — Assessment & Plan Note (Signed)
Iron deficiency anemia Iron deficiency related to prior gastric bypass surgery 10/19/2018: Iron saturation 10%, hemoglobin 11.9, MCV 84.5, ferritin 8 Prior treatment: IV iron: 2014 and 2016, November 2020 Lab review  10/22/2021: Hemoglobin 13.6, iron saturation 40%, ferritin 41, CMP normal 10/22/2022: Hemoglobin 13.7, ANC 2.3, platelets 153   Return to clinic in 1 year with labs and follow-up

## 2023-10-25 NOTE — Progress Notes (Signed)
Patient Care Team: Ronnald Nian, MD as PCP - General (Family Medicine) Swaziland, Peter M, MD as PCP - Cardiology (Cardiology)  DIAGNOSIS:  Encounter Diagnosis  Name Primary?   Malignant neoplasm of upper-outer quadrant of right breast in female, estrogen receptor positive (HCC) Yes    SUMMARY OF ONCOLOGIC HISTORY: Oncology History  Malignant neoplasm of upper-outer quadrant of right breast in female, estrogen receptor positive (HCC)  1993 Initial Diagnosis   Breast cancer (HCC): right breast, stage 2, 3/41 nodes involved, ER/PR negative, HER-2 not tested.  , treated on NSABP 525 with Adriamycin, Cytoxan   06/2007 Relapse/Recurrence   Right breast cancer.  Bilateral mastectomies with reconstruction: Right: IDC, 0.6cm, grade 1, 3 SLN negative, T1b, N0, ER+(84%), PR+(38%), Ki-67 14%, HER-2 negative.   Left: negative   06/2007 Surgery   TAH/BSO   06/2007 Genetic Testing   Negative genetic testing   07/2007 - 10/2014 Anti-estrogen oral therapy   Letrozole daily     CHIEF COMPLIANT: Surveillance of breast cancer  Discussed the use of AI scribe software for clinical note transcription with the patient, who gave verbal consent to proceed.  History of Present Illness   The patient, with a history of two bouts of cancer, the first in 1993 and the second in 2008, presents with ongoing memory issues. She has tried puzzles, reading books, and exercises to improve her memory, but reports no improvement. She has recently retired and reports that the reduction in stress has been beneficial. However, she also notes that she has become deconditioned due to a lack of physical activity since retirement. She has recently acquired a puppy, which has increased her level of physical activity.  The patient also reports occasional episodes of tachycardia, but denies any chest pain or discomfort. She believes her physical condition could be contributing to these episodes. She also mentions that she has  been crafting, which she finds enjoyable and helps to reduce stress.      ALLERGIES:  is allergic to morphine and codeine, levaquin [levofloxacin in d5w], and tylenol [acetaminophen].  MEDICATIONS:  Current Outpatient Medications  Medication Sig Dispense Refill   amoxicillin (AMOXIL) 500 MG capsule Take 500 mg by mouth 3 (three) times daily.     amphetamine-dextroamphetamine (ADDERALL) 30 MG tablet Take 1 tablet by mouth 3 (three) times daily. 90 tablet 0   B Complex-C (B-COMPLEX WITH VITAMIN C) tablet Take 1 tablet by mouth daily.     cetirizine (ZYRTEC) 10 MG tablet      Cholecalciferol (VITAMIN D3) 5000 units CAPS Take 5,000 Units by mouth daily.      clobetasol cream (TEMOVATE) 0.05 % Apply 1 application topically 2 (two) times daily. 45 g 2   cyclobenzaprine (FLEXERIL) 10 MG tablet Take 10 mg by mouth 3 (three) times daily as needed for muscle spasms.     cycloSPORINE (RESTASIS) 0.05 % ophthalmic emulsion Place 2 drops into both eyes daily.     diltiazem (CARDIZEM CD) 240 MG 24 hr capsule Take 1 capsule (240 mg total) by mouth daily. 90 capsule 3   fexofenadine (ALLEGRA) 180 MG tablet Take 180 mg by mouth daily as needed for allergies or rhinitis.     hydrochlorothiazide (MICROZIDE) 12.5 MG capsule Take 1 capsule (12.5 mg total) by mouth daily. 90 capsule 3   ibuprofen (ADVIL) 600 MG tablet      levothyroxine (SYNTHROID) 50 MCG tablet Take 1 tablet (50 mcg total) by mouth daily. 90 tablet 0   Multiple Vitamin (MULTIVITAMIN)  capsule Take 3 capsules by mouth daily.      valACYclovir (VALTREX) 500 MG tablet Take 1 tablet (500 mg total) by mouth daily. 30 tablet 0   venlafaxine XR (EFFEXOR-XR) 150 MG 24 hr capsule Take 1 capsule (150 mg total) by mouth daily with breakfast. 90 capsule 3   No current facility-administered medications for this visit.    PHYSICAL EXAMINATION: ECOG PERFORMANCE STATUS: 1 - Symptomatic but completely ambulatory  Vitals:   10/25/23 0827  BP: (!) 150/87   Pulse: 91  Resp: 18  Temp: (!) 97.5 F (36.4 C)  SpO2: 99%   Filed Weights   10/25/23 0827  Weight: 190 lb (86.2 kg)    Physical Exam          (exam performed in the presence of a chaperone)  LABORATORY DATA:  I have reviewed the data as listed    Latest Ref Rng & Units 03/25/2023    3:30 PM 03/18/2023    8:54 AM 10/22/2022    8:45 AM  CMP  Glucose 70 - 99 mg/dL 84  295  80   BUN 8 - 27 mg/dL 12  5  7    Creatinine 0.57 - 1.00 mg/dL 2.84  1.32  4.40   Sodium 134 - 144 mmol/L 137  139  142   Potassium 3.5 - 5.2 mmol/L 3.8  4.3  4.1   Chloride 96 - 106 mmol/L 95  102  109   CO2 20 - 29 mmol/L 26  24  27    Calcium 8.7 - 10.3 mg/dL 9.6  9.0  8.7   Total Protein 6.5 - 8.1 g/dL   6.5   Total Bilirubin 0.3 - 1.2 mg/dL   0.4   Alkaline Phos 38 - 126 U/L   89   AST 15 - 41 U/L   21   ALT 0 - 44 U/L   19     Lab Results  Component Value Date   WBC 4.6 10/25/2023   HGB 13.9 10/25/2023   HCT 43.4 10/25/2023   MCV 93.3 10/25/2023   PLT 165 10/25/2023   NEUTROABS 2.8 10/25/2023    ASSESSMENT & PLAN:  Malignant neoplasm of upper-outer quadrant of right breast in female, estrogen receptor positive (HCC) History of 2 primary right breast cancers: on surveillance since she completed 7 years [facility] 10-2014, clinically doing well.    Osteopenia: 01/10/2020: Bone density: T score -2.5: Osteoporosis, recommended calcium vitamin D and bisphosphonate therapy   Breast cancer surveillance:  Hysterectomy with oophorectomy 2008 prophylactic left mastectomy, bilateral breast reconstructions No role of imaging since she had bilateral mastectomies   Cloudiness in the head and decreased memory: She will continue to work on exercises.  She got herself a puppy which is helping her.     No orders of the defined types were placed in this encounter.  The patient has a good understanding of the overall plan. she agrees with it. she will call with any problems that may develop before the next  visit here. Total time spent: 30 mins including face to face time and time spent for planning, charting and co-ordination of care   Tamsen Meek, MD 10/25/23

## 2023-10-26 ENCOUNTER — Encounter: Payer: Self-pay | Admitting: Family Medicine

## 2023-11-02 ENCOUNTER — Ambulatory Visit: Payer: PRIVATE HEALTH INSURANCE | Admitting: Cardiology

## 2023-11-04 ENCOUNTER — Encounter: Payer: Self-pay | Admitting: Hematology and Oncology

## 2023-11-29 ENCOUNTER — Encounter: Payer: Self-pay | Admitting: Hematology and Oncology

## 2023-12-15 ENCOUNTER — Ambulatory Visit: Payer: Medicare Other | Admitting: Family Medicine

## 2023-12-15 VITALS — BP 132/84 | HR 84 | Ht 65.0 in | Wt 192.0 lb

## 2023-12-15 DIAGNOSIS — F900 Attention-deficit hyperactivity disorder, predominantly inattentive type: Secondary | ICD-10-CM

## 2023-12-15 DIAGNOSIS — E038 Other specified hypothyroidism: Secondary | ICD-10-CM | POA: Diagnosis not present

## 2023-12-15 DIAGNOSIS — F341 Dysthymic disorder: Secondary | ICD-10-CM

## 2023-12-15 MED ORDER — VENLAFAXINE HCL 100 MG PO TABS: 100.0000 mg | ORAL_TABLET | Freq: Two times a day (BID) | ORAL | 1 refills | Status: DC

## 2023-12-15 NOTE — Progress Notes (Signed)
She is here for consult concerning multiple issues.  Subjective:    Patient ID: Christy [name], female    DOB: August 07, 1956, 67 y.o.   MRN: [id number]  HPI She is here for consult concerning multiple issues.  She stopped taking her thyroid because she has heard that it could cause bone issues.  She also is now semiretired and taking Effexor but feels as if it is not necessarily working because she has really no motivation to get anything done.  She is interested in potentially going a bit higher with the Effexor.  She continues on her ADD medication which seems to be working fairly well for her.   Review of Systems     Objective:    Physical Exam Alert and in no distress.  Cardiac exam shows regular rhythm without murmurs ago.  Lungs are clear to auscultation.  DTRs are normal. TSH on October 28 was 7.6.      Assessment & Plan:  Dysthymia - Plan: venlafaxine (EFFEXOR) 100 MG tablet  Other specified hypothyroidism  Attention deficit hyperactivity disorder (ADHD), predominantly inattentive type She is to take Effexor twice per day but I also indicated that her lack of motivation could also be her low thyroid as her previous TSH was high indicating hypothyroid.  Recommend she start taking the thyroid medication again and we will check her thyroid function and see how well she is doing on her Effexor in 2 months.

## 2023-12-30 ENCOUNTER — Ambulatory Visit
Admission: RE | Admit: 2023-12-30 | Discharge: 2023-12-30 | Disposition: A | Discharge status: Home / Self Care | Payer: Medicare Other | Source: Ambulatory Visit | Attending: Family Medicine | Admitting: Family Medicine

## 2024-01-13 ENCOUNTER — Ambulatory Visit: Payer: Medicare Other | Admitting: Family Medicine

## 2024-01-13 ENCOUNTER — Encounter: Payer: Self-pay | Admitting: Family Medicine

## 2024-01-13 VITALS — BP 138/92 | HR 106 | Ht 65.0 in | Wt 191.2 lb

## 2024-01-13 DIAGNOSIS — F900 Attention-deficit hyperactivity disorder, predominantly inattentive type: Secondary | ICD-10-CM

## 2024-01-13 DIAGNOSIS — M810 Age-related osteoporosis without current pathological fracture: Secondary | ICD-10-CM

## 2024-01-13 MED ORDER — AMPHETAMINE-DEXTROAMPHETAMINE 30 MG PO TABS: 30.0000 mg | ORAL_TABLET | Freq: Three times a day (TID) | ORAL | 0 refills | Status: DC

## 2024-01-13 NOTE — Progress Notes (Signed)
   Subjective:    Patient ID: Christy [name], female    DOB: 09/17/1956, 68 y.o.   MRN: [id number]  HPI She is here for consult concerning recent DEXA scan which did show osteoporosis with -3.1 reading.  Prior to that she was osteopenic and was using multivitamin and vitamin D.  She also needs a refill on her Adderall.   Review of Systems     Objective:    Physical Exam Alert and in no distress otherwise not examined       Assessment & Plan:  Age-related osteoporosis without current pathological fracture  Attention deficit hyperactivity disorder (ADHD), predominantly inattentive type - Plan: amphetamine-dextroamphetamine (ADDERALL) 30 MG tablet  I discussed the diagnosis of osteoporosis and possible treatment options with her.  We discussed Prolia as well as Evenity.  Reviewed the mechanism of action as well as possible side effects and dosing regimen.  After discussing all of this with her she would like to be placed on Prolia.  We will work on getting prior approval for this.

## 2024-01-17 ENCOUNTER — Other Ambulatory Visit: Payer: Self-pay | Admitting: Internal Medicine

## 2024-01-17 DIAGNOSIS — M810 Age-related osteoporosis without current pathological fracture: Secondary | ICD-10-CM

## 2024-01-17 MED ORDER — DENOSUMAB 60 MG/ML ~~LOC~~ SOSY
60.0000 mg | PREFILLED_SYRINGE | Freq: Once | SUBCUTANEOUS | Status: AC
  Administered 2024-03-22: 60 mg via SUBCUTANEOUS

## 2024-01-17 NOTE — Progress Notes (Signed)
NEW START FOR PATIENT.   ProlIa  Dexa - 3.1

## 2024-02-02 ENCOUNTER — Telehealth: Payer: Self-pay | Admitting: Family Medicine

## 2024-02-02 NOTE — Telephone Encounter (Signed)
 Pt called and is currently in [facility]. She is asking for a refill on effexor  by 1 pm today due to her starting to feel nauseas and she also will be going on a boat ride today. She provided a pharmacy [facility] [phone] - [place], [place] - [address]. She states she takes two in the morning and two in the evening.

## 2024-02-02 NOTE — Telephone Encounter (Signed)
Called patient and she states this has already been taken care of

## 2024-02-09 ENCOUNTER — Ambulatory Visit: Payer: Medicare Other | Admitting: Family Medicine

## 2024-02-15 ENCOUNTER — Encounter: Payer: Self-pay | Admitting: Hematology and Oncology

## 2024-02-15 ENCOUNTER — Telehealth: Payer: Self-pay

## 2024-02-15 ENCOUNTER — Other Ambulatory Visit (HOSPITAL_COMMUNITY): Payer: Self-pay

## 2024-02-15 NOTE — Telephone Encounter (Signed)
 Prolia VOB initiated via [email or web address]

## 2024-02-16 ENCOUNTER — Other Ambulatory Visit: Payer: Self-pay | Admitting: Family Medicine

## 2024-02-16 MED ORDER — CYCLOBENZAPRINE HCL 10 MG PO TABS: 10.0000 mg | ORAL_TABLET | Freq: Three times a day (TID) | ORAL | 1 refills | Status: AC | PRN

## 2024-02-22 ENCOUNTER — Encounter: Payer: Self-pay | Admitting: Family Medicine

## 2024-02-22 ENCOUNTER — Encounter: Payer: Self-pay | Admitting: Internal Medicine

## 2024-02-23 ENCOUNTER — Other Ambulatory Visit (HOSPITAL_COMMUNITY): Payer: Self-pay

## 2024-02-23 NOTE — Telephone Encounter (Addendum)
    Resubmitted in [employer] with Traditional Medicare information.

## 2024-02-24 ENCOUNTER — Other Ambulatory Visit (HOSPITAL_COMMUNITY): Payer: Self-pay

## 2024-02-24 NOTE — Telephone Encounter (Signed)
 Pt ready for scheduling for PROLIA on or after : 02/24/24  Option# 1: Buy/Bill (Office supplied medication)  Out-of-pocket cost due at time of clinic visit: $494.75  Number of injection/visits approved: ---  Primary: MEDICARE Prolia co-insurance: 20% Admin fee co-insurance: 20%  Secondary: --- Prolia co-insurance:  Admin fee co-insurance:   Medical Benefit Details: Date Benefits were checked: 02/23/24 Deductible: $119.25 Met of $257 Required/ Coinsurance: 20%/ Admin Fee: 20%  Prior Auth: N/A PA# Expiration Date:   # of doses approved: ----------------------------------------------------------------------- Option# 2- Med Obtained from pharmacy:  Pharmacy benefit: Copay $1125.90 (Paid to pharmacy) Admin Fee: 20% (Pay at clinic)  Prior Auth: N/A PA# Expiration Date:   # of doses approved:   If patient wants fill through the pharmacy benefit please send prescription to: [facility], and include estimated need by date in rx notes. Pharmacy will ship medication directly to the office.  Patient NOT eligible for Prolia Copay Card. Copay Card can make patient's cost as little as $25. Link to apply: [email or web address]://[email or web address]  ** This summary of benefits is an estimation of the patient's out-of-pocket cost. Exact cost may very based on individual plan coverage.

## 2024-02-24 NOTE — Telephone Encounter (Signed)
 Christy [name]

## 2024-02-29 ENCOUNTER — Encounter: Payer: Self-pay | Admitting: Hematology and Oncology

## 2024-03-07 ENCOUNTER — Other Ambulatory Visit (HOSPITAL_COMMUNITY): Payer: Self-pay

## 2024-03-08 ENCOUNTER — Other Ambulatory Visit: Payer: Self-pay | Admitting: Family Medicine

## 2024-03-08 DIAGNOSIS — F341 Dysthymic disorder: Secondary | ICD-10-CM

## 2024-03-09 NOTE — Telephone Encounter (Signed)
 Is this okay to refill?

## 2024-03-21 ENCOUNTER — Other Ambulatory Visit

## 2024-03-22 ENCOUNTER — Other Ambulatory Visit (INDEPENDENT_AMBULATORY_CARE_PROVIDER_SITE_OTHER)

## 2024-03-22 ENCOUNTER — Other Ambulatory Visit: Payer: Self-pay | Admitting: *Deleted

## 2024-03-22 DIAGNOSIS — E039 Hypothyroidism, unspecified: Secondary | ICD-10-CM

## 2024-03-22 DIAGNOSIS — M810 Age-related osteoporosis without current pathological fracture: Secondary | ICD-10-CM | POA: Diagnosis not present

## 2024-03-22 MED ORDER — LEVOTHYROXINE SODIUM 50 MCG PO TABS: 50.0000 ug | ORAL_TABLET | Freq: Every day | ORAL | 1 refills | Status: DC

## 2024-04-17 ENCOUNTER — Encounter: Payer: Self-pay | Admitting: Family Medicine

## 2024-04-18 MED ORDER — ONDANSETRON 4 MG PO TBDP
4.0000 mg | ORAL_TABLET | Freq: Three times a day (TID) | ORAL | 0 refills | Status: AC | PRN
Start: 2024-04-18 — End: ?

## 2024-04-18 MED ORDER — FLUCONAZOLE 150 MG PO TABS: 150.0000 mg | ORAL_TABLET | Freq: Once | ORAL | 0 refills | Status: AC

## 2024-04-28 ENCOUNTER — Other Ambulatory Visit: Payer: Self-pay | Admitting: Internal Medicine

## 2024-04-28 NOTE — Progress Notes (Signed)
 Ordered prolia  and sent to [facility] going forward

## 2024-05-18 ENCOUNTER — Ambulatory Visit: Payer: Self-pay

## 2024-05-18 NOTE — Telephone Encounter (Signed)
 Copied from CRM [id number]. Topic: Clinical - Red Word Triage >> May 18, 2024 10:17 AM Jenice Mitts wrote: Red Word that prompted transfer to Nurse Triage: fever, short of breath  Patient is calling because she thinks she may have pneumonia. She had fever, body aches and chills,   Chief Complaint: cough x 2 week Symptoms: mild shortness of breath, body aches, and chills Frequency: constant Pertinent Negatives: Patient denies chest pain Disposition: [] ED /[] Urgent Care (no appt availability in office) / [] Appointment(In office/virtual)/ []  [facility]/ [] Home Care/ [x] Refused Recommended Disposition /[] [facility]/ [x]  Follow-up with PCP Additional Notes: Pt reports symptoms times 2 weeks, getting worse. No avail appts until 5/29, RN advising urgent care. Pt requested virtual visit, RN advised that a virtual is not appropriate for her symptoms. Pt states that she would prefer to see Dr. Robina Chol. Will route message to clinic for f/u and scheduling Reason for Disposition  [1] Continuous (nonstop) coughing interferes with work or school AND [2] no improvement using cough treatment per Care Advice  Answer Assessment - Initial Assessment Questions 1. ONSET: "When did the cough begin?"      2 weeks  2. SEVERITY: "How bad is the cough today?"      Getting worse  3. SPUTUM: "Describe the color of your sputum" (none, dry cough; clear, white, yellow, green)     Unsure  4. HEMOPTYSIS: "Are you coughing up any blood?" If so ask: "How much?" (flecks, streaks, tablespoons, etc.)     No  5. DIFFICULTY BREATHING: "Are you having difficulty breathing?" If Yes, ask: "How bad is it?" (e.g., mild, moderate, severe)    - MILD: No SOB at rest, mild SOB with walking, speaks normally in sentences, can lie down, no retractions, pulse < 100.    - MODERATE: SOB at rest, SOB with minimal exertion and prefers to sit, cannot lie down flat, speaks in phrases, mild retractions, audible wheezing,  pulse 100-120.    - SEVERE: Very SOB at rest, speaks in single words, struggling to breathe, sitting hunched forward, retractions, pulse > 120      Mild shortness of breath, reports feeling winded when talking  6. FEVER: "Do you have a fever?" If Yes, ask: "What is your temperature, how was it measured, and when did it start?"     Unsure of temperature, reports feeling hot and cold  7. CARDIAC HISTORY: "Do you have any history of heart disease?" (e.g., heart attack, congestive heart failure)      Tachy arrhthymias  8. LUNG HISTORY: "Do you have any history of lung disease?"  (e.g., pulmonary embolus, asthma, emphysema)     No  9. PE RISK FACTORS: "Do you have a history of blood clots?" (or: recent major surgery, recent prolonged travel, bedridden)     .  10. OTHER SYMPTOMS: "Do you have any other symptoms?" (e.g., runny nose, wheezing, chest pain)       Body aches, loss of appetite  11. PREGNANCY: "Is there any chance you are pregnant?" "When was your last menstrual period?"       No  12. TRAVEL: "Have you traveled out of the country in the last month?" (e.g., travel history, exposures)       Was in a waiting room 2 weeks, pt was in a room full of sick people  Protocols used: Cough - Acute Productive-A-AH

## 2024-05-19 ENCOUNTER — Encounter: Payer: Self-pay | Admitting: Nurse Practitioner

## 2024-05-19 ENCOUNTER — Ambulatory Visit (INDEPENDENT_AMBULATORY_CARE_PROVIDER_SITE_OTHER): Admitting: Nurse Practitioner

## 2024-05-19 VITALS — BP 138/90 | HR 79 | Wt 189.6 lb

## 2024-05-19 DIAGNOSIS — I471 Supraventricular tachycardia, unspecified: Secondary | ICD-10-CM | POA: Diagnosis not present

## 2024-05-19 DIAGNOSIS — J189 Pneumonia, unspecified organism: Secondary | ICD-10-CM | POA: Diagnosis not present

## 2024-05-19 DIAGNOSIS — B379 Candidiasis, unspecified: Secondary | ICD-10-CM

## 2024-05-19 MED ORDER — FLUCONAZOLE 150 MG PO TABS: 150.0000 mg | ORAL_TABLET | Freq: Once | ORAL | 2 refills | Status: AC

## 2024-05-19 MED ORDER — AZITHROMYCIN 250 MG PO TABS: ORAL_TABLET | ORAL | 0 refills | Status: AC

## 2024-05-19 MED ORDER — BENZONATATE 200 MG PO CAPS: 200.0000 mg | ORAL_CAPSULE | Freq: Two times a day (BID) | ORAL | 2 refills | Status: DC | PRN

## 2024-05-19 NOTE — Progress Notes (Signed)
 Christy Kief, DNP, AGNP-c [facility] [address] [place], [place] [address] [phone]   ACUTE VISIT- ESTABLISHED PATIENT  Blood pressure (!) 138/90, pulse 79, weight 189 lb 9.6 oz (86 kg), SpO2 98%.  Subjective:  HPI Christy [name] is a 68 y.o. female presents to day for evaluation of acute concern(s).   History of Present Illness Christy [name] is a 68 year old female who presents with persistent cough, fever, and body aches following recent travel.  Two weeks ago, she returned from a trip to [facility] and developed symptoms including fever, sweats, chills, headaches, body aches, and a persistent cough. These symptoms began within 24 hours after she choked on food and was exposed to coughing individuals in a waiting room.  She has experienced significant fatigue, stating she has not left her house for two weeks. She initially lost eight pounds, which she attributes to her illness.  Her cough is productive, with yellowish-green sputum, and worsens with activity, causing shortness of breath and severe coughing fits that can last until she breaks a sweat. No chest pain but shortness of breath with minimal exertion.  She experiences ear popping and snapping, particularly in the right ear. No current sore throat but had it initially.  Her current medications include diltiazem  for tachyarrhythmia. She uses a 'NyQuil cocktail' for symptom relief and has previously used Diflucan  for yeast infections.  She recalls a previous episode of pneumonia following the flu, describing a rapid onset of symptoms similar to her current experience. She does not tolerate albuterol or steroids due to significant agitation and a feeling of increased anxiety/energy.  ROS negative except for what is listed in HPI. History, Medications, Surgery, SDOH, and Family History reviewed and updated as appropriate.  Objective:  Physical Exam Vitals and nursing note reviewed.  Constitutional:       General: She is not in acute distress. HENT:     Head: Normocephalic.     Right Ear: A middle ear effusion is present.     Left Ear: A middle ear effusion is present.     Nose: Congestion present.     Mouth/Throat:     Mouth: Mucous membranes are moist.     Pharynx: Oropharynx is clear. Posterior oropharyngeal erythema present.  Cardiovascular:     Rate and Rhythm: Normal rate and regular rhythm.     Pulses: Normal pulses.     Heart sounds: Normal heart sounds.  Pulmonary:     Breath sounds: Wheezing and rhonchi present.     Comments: Right middle and lower lobe affected Musculoskeletal:     Cervical back: No tenderness.  Lymphadenopathy:     Cervical: Cervical adenopathy present.  Skin:    General: Skin is warm and dry.     Capillary Refill: Capillary refill takes less than 2 seconds.  Neurological:     General: No focal deficit present.     Mental Status: She is alert and oriented to person, place, and time.  Psychiatric:        Mood and Affect: Mood normal.        Assessment & Plan:   Problem List Items Addressed This Visit     SVT (supraventricular tachycardia) (HCC)   Tachyarrhythmia managed with diltiazem . She reports adverse reactions to albuterol, including feeling 'jacked up' and concerns about exacerbating tachyarrhythmia. - Avoid prescribing albuterol due to adverse reactions and potential exacerbation of tachyarrhythmia.      Community acquired pneumonia of right lower lobe of lung -  Primary   Suspected community-acquired pneumonia based on symptoms of fever, sweats, chills, headaches, body aches, and productive cough with yellowish-green sputum. Auscultation reveals crackles in the right lower lobe. Symptoms have persisted for two weeks. Oxygenation is good. No chest x-ray is deemed necessary as it would not change the management plan, however this is recommended if symptoms fail to improve with treatment. She prefers to avoid unnecessary radiation exposure.  Advised to rest and avoid working in the ED to prevent further exposure to infections. - Prescribe azithromycin : two tablets on the first day, followed by one tablet daily for four days. - Advise rest and increased fluid intake. - Recommend Mucinex for mucus clearance. - Prescribe Tessalon  for cough management. - Prescribe Diflucan  to prevent yeast infection due to antibiotic use. - Instruct to contact the clinic if symptoms do not improve after completing the antibiotic course.      Relevant Medications   azithromycin  (ZITHROMAX ) 250 MG tablet   benzonatate  (TESSALON ) 200 MG capsule   fluconazole  (DIFLUCAN ) 150 MG tablet   Other Visit Diagnoses       Yeast infection       Relevant Medications   azithromycin  (ZITHROMAX ) 250 MG tablet   fluconazole  (DIFLUCAN ) 150 MG tablet         Christy Kief, DNP, AGNP-c

## 2024-05-19 NOTE — Assessment & Plan Note (Signed)
 Tachyarrhythmia managed with diltiazem . She reports adverse reactions to albuterol, including feeling 'jacked up' and concerns about exacerbating tachyarrhythmia. - Avoid prescribing albuterol due to adverse reactions and potential exacerbation of tachyarrhythmia.

## 2024-05-19 NOTE — Assessment & Plan Note (Addendum)
 Suspected community-acquired pneumonia based on symptoms of fever, sweats, chills, headaches, body aches, and productive cough with yellowish-green sputum. Auscultation reveals crackles in the right lower lobe. Symptoms have persisted for two weeks. Oxygenation is good. No chest x-ray is deemed necessary as it would not change the management plan, however this is recommended if symptoms fail to improve with treatment. She prefers to avoid unnecessary radiation exposure. Advised to rest and avoid working in the ED to prevent further exposure to infections. - Prescribe azithromycin : two tablets on the first day, followed by one tablet daily for four days. - Advise rest and increased fluid intake. - Recommend Mucinex for mucus clearance. - Prescribe Tessalon  for cough management. - Prescribe Diflucan  to prevent yeast infection due to antibiotic use. - Instruct to contact the clinic if symptoms do not improve after completing the antibiotic course.

## 2024-05-19 NOTE — Patient Instructions (Addendum)
 I have sent in the Azithromycin  for the suspected pneumonia.  I would like you to rest as much as possible and stay hydrated.   I recommend mucinex to help with the mucous and to clear out the lungs.  You can take the tessalon  to help with the cough.   If you are not better in the next 6-8 days, please let us  know and we will get a chest x-ray.

## 2024-05-30 ENCOUNTER — Encounter: Payer: Self-pay | Admitting: Family Medicine

## 2024-05-30 ENCOUNTER — Other Ambulatory Visit: Payer: Self-pay | Admitting: *Deleted

## 2024-05-30 DIAGNOSIS — R2689 Other abnormalities of gait and mobility: Secondary | ICD-10-CM

## 2024-06-05 NOTE — Therapy (Unsigned)
 OUTPATIENT PHYSICAL THERAPY LOWER EXTREMITY EVALUATION   Patient Name: Christy [name] MRN: [id number] DOB:17-Feb-1956, 68 y.o., female Today's Date: 06/05/2024  END OF SESSION:   Past Medical History:  Diagnosis Date   ADD (attention deficit disorder)    Allergy    rhinitis   Anemia    Arthritis    osteo   Breast cancer (HCC) 7253,6644   Breast , skin - basal    Complication of anesthesia 2004   gastric bypass [facility] ,  acetyl succ-, hard time awaking, placed back on vent for a couiple hour   Dyspnea    GERD (gastroesophageal reflux disease)    History of hiatal hernia    History of kidney stones    passed   Hypothyroid    Internal hemorrhoids    Obesity    Pneumonia    Tubular adenoma of colon    Past Surgical History:  Procedure Laterality Date   BREAST LUMPECTOMY Right    CESAREAN SECTION     x 2   CHOLECYSTECTOMY     COLONOSCOPY  07/2013   ESOPHAGOGASTRODUODENOSCOPY     GASTRIC BYPASS     JOINT REPLACEMENT     KNEE ARTHROSCOPY Right    x 3   LAPAROSCOPIC APPENDECTOMY N/A 04/03/2015   Procedure: APPENDECTOMY LAPAROSCOPIC;  Surgeon: Lillette Reid III, MD;  Location: [facility];  Service: General;  Laterality: N/A;   LITHOTRIPSY     x3   MASTECTOMY Bilateral    with lymph node dissection  bil reconstruction 2009   TOTAL KNEE ARTHROPLASTY Right 12/18/2013   Procedure: TOTAL KNEE ARTHROPLASTY;  Surgeon: Ilean Mall, MD;  Location: [facility];  Service: Orthopedics;  Laterality: Right;   TOTAL KNEE ARTHROPLASTY Left 06/29/2018   TOTAL KNEE ARTHROPLASTY Left 06/29/2018   Procedure: LEFT TOTAL KNEE ARTHROPLASTY;  Surgeon: Wendolyn Hamburger, MD;  Location: [facility];  Service: Orthopedics;  Laterality: Left;   VAGINAL HYSTERECTOMY     Patient Active Problem List   Diagnosis Date Noted   Community acquired pneumonia of right lower lobe of lung 05/19/2024   Osteoporosis 08/18/2021   SVT (supraventricular tachycardia) (HCC) 08/18/2021   Leg cramps 08/18/2021   Status post  cataract extraction 08/18/2021   Hearing loss 09/14/2019   Tinnitus of both ears 09/14/2019   Degenerative arthritis of left knee 06/07/2018   S/P TKR (total knee replacement) using cement 03/12/2016   Hypothyroidism 03/12/2016   Solar lentigo 03/12/2016   Attention deficit hyperactivity disorder (ADHD), predominantly inattentive type 07/04/2013   Iron  deficiency anemia 04/05/2013   H/O gastric bypass 03/06/2012   Malignant neoplasm of upper-outer quadrant of right breast in female, estrogen receptor positive (HCC) 03/04/2012    PCP: Dr Watson Hacking  REFERRING PROVIDER: Dr Watson Hacking  REFERRING DIAG: Balance problem   THERAPY DIAG:  No diagnosis found.  Rationale for Evaluation and Treatment: Rehabilitation  ONSET DATE: ***  SUBJECTIVE:   SUBJECTIVE STATEMENT: Patient reports that she has fallen 4 times in the past few months. She feels that she is weak and that is why she has fallen several times in the last few months. Her most recent fall was 06/01/24 when she bent forward to pick something up and lost her balance, falling forward striking her face sustaining abrasions of nose, lips, forehead.   PERTINENT HISTORY: Seen for PT 7/19 for L TKA; Rt knee injury 1974 - several surgerires on Rt knee creating stress on the Lt; Rt TKA 2014; denies any  other medical problems; osteopenia; bilat mastectomy 2009  PAIN:  Are you having pain?  Yes: NPRS scale: *** Pain location: *** Pain description: *** Aggravating factors: *** Relieving factors: ***  PRECAUTIONS: Other: frequent falls   RED FLAGS: None   WEIGHT BEARING RESTRICTIONS: No  FALLS:  Has patient fallen in last 6 months? Yes. Number of falls 4  LIVING ENVIRONMENT: Lives with: lives with their spouse Lives in: House/apartment Stairs: 1 step into the house; multilevel home bedroom upstairs - railings  Has following equipment at home: {Assistive devices:23999}  OCCUPATION: ***  PLOF: Independent  PATIENT  GOALS: ***  NEXT MD VISIT: none scheduled   OBJECTIVE:  Note: Objective measures were completed at Evaluation unless otherwise noted.  DIAGNOSTIC FINDINGS: bone d1 step into the house; multilevel home bedroom upstairs - railings enisty 1/25:   PATIENT SURVEYS:  {rehab surveys:24030}  COGNITION: Overall cognitive status: Within functional limits for tasks assessed     SENSATION: WFL  EDEMA:  {edema:24020}  MUSCLE LENGTH: Hamstrings: Right *** deg; Left *** deg Andy Bannister test: Right *** deg; Left *** deg  POSTURE: {posture:25561}  PALPATION: ***  LOWER EXTREMITY ROM:  Active ROM Right eval Left eval  Hip flexion    Hip extension    Hip abduction    Hip adduction    Hip internal rotation    Hip external rotation    Knee flexion    Knee extension    Ankle dorsiflexion    Ankle plantarflexion    Ankle inversion    Ankle eversion     (Blank rows = not tested)  LOWER EXTREMITY MMT:  MMT Right eval Left eval  Hip flexion    Hip extension    Hip abduction    Hip adduction    Hip internal rotation    Hip external rotation    Knee flexion    Knee extension    Ankle dorsiflexion    Ankle plantarflexion    Ankle inversion    Ankle eversion     (Blank rows = not tested)  LOWER EXTREMITY SPECIAL TESTS:  {LEspecialtests:26242}  FUNCTIONAL TESTS:  5 times sit to stand: ***  GAIT: Distance walked: *** Assistive device utilized: {Assistive devices:23999} Level of assistance: {Levels of assistance:24026} Comments: ***                                                                                                                                TREATMENT DATE:  Review clinical assessment;  Revew a    PATIENT EDUCATION:  Education details: POC; HEP  Person educated: Patient Education method: Explanation, Demonstration, Tactile cues, and Verbal cues Education comprehension: verbalized understanding, returned demonstration, verbal cues required, tactile  cues required, and needs further education  HOME EXERCISE PROGRAM: ***  ASSESSMENT:  CLINICAL IMPRESSION: Patient is a 68 y.o. female who was seen today for physical therapy evaluation and treatment for decreased balance and frequent falls.   OBJECTIVE IMPAIRMENTS: {  opptimpairments:25111}.   ACTIVITY LIMITATIONS: {activitylimitations:27494}  PARTICIPATION LIMITATIONS: {participationrestrictions:25113}  PERSONAL FACTORS: {Personal factors:25162} are also affecting patient's functional outcome.   REHAB POTENTIAL: Good  CLINICAL DECISION MAKING: Evolving/moderate complexity  EVALUATION COMPLEXITY: Moderate   GOALS: Goals reviewed with patient? Yes  SHORT TERM GOALS: Target date: *** Independent in initial HEP  Baseline: Goal status: INITIAL  2.  *** Baseline:  Goal status: INITIAL  3.  *** Baseline:  Goal status: INITIAL    LONG TERM GOALS: Target date: ***  *** Baseline:  Goal status: INITIAL  2.  *** Baseline:  Goal status: INITIAL  3.  *** Baseline:  Goal status: INITIAL  4.  *** Baseline:  Goal status: INITIAL  5.  *** Baseline:  Goal status: INITIAL  6.  *** Baseline:  Goal status: INITIAL   PLAN:  PT FREQUENCY: 2x/week  PT DURATION: 8 weeks  PLANNED INTERVENTIONS: 97164- PT Re-evaluation, 97110-Therapeutic exercises, 97530- Therapeutic activity, 97112- Neuromuscular re-education, 97535- Self Care, 19147- Manual therapy, [id number]- Gait training, [phone]- Aquatic Therapy, Patient/Family education, Balance training, Stair training, Taping, Joint mobilization, Cryotherapy, and Moist heat  PLAN FOR NEXT SESSION:review and progress exercises; continue balance assessment and treatment as indicated    Marsheila Alejo Hadley Leu, PT 06/05/2024, 2:25 PM

## 2024-06-07 ENCOUNTER — Encounter: Payer: Self-pay | Admitting: Hematology and Oncology

## 2024-06-07 ENCOUNTER — Other Ambulatory Visit: Payer: Self-pay

## 2024-06-07 ENCOUNTER — Encounter: Payer: Self-pay | Admitting: Family Medicine

## 2024-06-07 ENCOUNTER — Ambulatory Visit
Payer: PRIVATE HEALTH INSURANCE | Attending: Family Medicine | Admitting: Rehabilitative and Restorative Service Providers"

## 2024-06-07 ENCOUNTER — Encounter: Payer: Self-pay | Admitting: Rehabilitative and Restorative Service Providers"

## 2024-06-07 DIAGNOSIS — R29898 Other symptoms and signs involving the musculoskeletal system: Secondary | ICD-10-CM | POA: Diagnosis present

## 2024-06-07 DIAGNOSIS — M6281 Muscle weakness (generalized): Secondary | ICD-10-CM | POA: Diagnosis present

## 2024-06-07 DIAGNOSIS — R2689 Other abnormalities of gait and mobility: Secondary | ICD-10-CM | POA: Diagnosis present

## 2024-06-19 ENCOUNTER — Telehealth: Payer: Self-pay | Admitting: Pharmacy Technician

## 2024-06-19 ENCOUNTER — Encounter: Payer: Self-pay | Admitting: Hematology and Oncology

## 2024-06-19 ENCOUNTER — Encounter: Payer: Self-pay | Admitting: Family Medicine

## 2024-06-19 NOTE — Telephone Encounter (Signed)
 Auth Submission: NO AUTH NEEDED Site of care: Site of care: [facility] Payer: MEDICARE A/B Medication & CPT/J Code(s) submitted: Prolia  (Denosumab ) [id number] Diagnosis Code:  Route of submission (phone, fax, portal):  Phone # Fax # Auth type: Buy/Bill PB Units/visits requested: 60MG  Q6M X 2 DOSES Reference number:  Approval from: 06/19/24 to 01/27/25

## 2024-07-17 ENCOUNTER — Other Ambulatory Visit: Payer: Self-pay | Admitting: Family Medicine

## 2024-07-17 DIAGNOSIS — F341 Dysthymic disorder: Secondary | ICD-10-CM

## 2024-08-01 ENCOUNTER — Encounter: Admitting: Family Medicine

## 2024-08-01 DIAGNOSIS — Z1159 Encounter for screening for other viral diseases: Secondary | ICD-10-CM

## 2024-08-01 DIAGNOSIS — C50411 Malignant neoplasm of upper-outer quadrant of right female breast: Secondary | ICD-10-CM

## 2024-08-01 DIAGNOSIS — M810 Age-related osteoporosis without current pathological fracture: Secondary | ICD-10-CM

## 2024-08-01 DIAGNOSIS — F900 Attention-deficit hyperactivity disorder, predominantly inattentive type: Secondary | ICD-10-CM

## 2024-08-01 DIAGNOSIS — Z9849 Cataract extraction status, unspecified eye: Secondary | ICD-10-CM

## 2024-08-01 DIAGNOSIS — I471 Supraventricular tachycardia, unspecified: Secondary | ICD-10-CM

## 2024-08-01 DIAGNOSIS — Z96651 Presence of right artificial knee joint: Secondary | ICD-10-CM

## 2024-08-01 DIAGNOSIS — Z13220 Encounter for screening for lipoid disorders: Secondary | ICD-10-CM

## 2024-08-04 ENCOUNTER — Telehealth: Payer: Self-pay | Admitting: Hematology and Oncology

## 2024-08-04 NOTE — Telephone Encounter (Signed)
 Called to reschedule patient appointment to due provider pal  request. I talked  to patient and they are aware of the changes that was made to the upcoming appointment

## 2024-08-09 ENCOUNTER — Encounter: Admitting: Family Medicine

## 2024-08-09 DIAGNOSIS — F900 Attention-deficit hyperactivity disorder, predominantly inattentive type: Secondary | ICD-10-CM

## 2024-08-09 DIAGNOSIS — H833X3 Noise effects on inner ear, bilateral: Secondary | ICD-10-CM

## 2024-08-09 DIAGNOSIS — Z96651 Presence of right artificial knee joint: Secondary | ICD-10-CM

## 2024-08-09 DIAGNOSIS — M8000XA Age-related osteoporosis with current pathological fracture, unspecified site, initial encounter for fracture: Secondary | ICD-10-CM

## 2024-08-09 DIAGNOSIS — Z9884 Bariatric surgery status: Secondary | ICD-10-CM

## 2024-08-09 DIAGNOSIS — Z9849 Cataract extraction status, unspecified eye: Secondary | ICD-10-CM

## 2024-08-09 DIAGNOSIS — D508 Other iron deficiency anemias: Secondary | ICD-10-CM

## 2024-08-09 DIAGNOSIS — E039 Hypothyroidism, unspecified: Secondary | ICD-10-CM

## 2024-08-09 DIAGNOSIS — R252 Cramp and spasm: Secondary | ICD-10-CM

## 2024-08-09 DIAGNOSIS — M1712 Unilateral primary osteoarthritis, left knee: Secondary | ICD-10-CM

## 2024-08-09 DIAGNOSIS — I471 Supraventricular tachycardia, unspecified: Secondary | ICD-10-CM

## 2024-08-09 DIAGNOSIS — Z170 Estrogen receptor positive status [ER+]: Secondary | ICD-10-CM

## 2024-08-09 DIAGNOSIS — Z13220 Encounter for screening for lipoid disorders: Secondary | ICD-10-CM

## 2024-08-13 DIAGNOSIS — Z860101 Personal history of adenomatous and serrated colon polyps: Secondary | ICD-10-CM | POA: Insufficient documentation

## 2024-08-15 ENCOUNTER — Ambulatory Visit (INDEPENDENT_AMBULATORY_CARE_PROVIDER_SITE_OTHER): Admitting: Family Medicine

## 2024-08-15 ENCOUNTER — Encounter: Payer: Self-pay | Admitting: Family Medicine

## 2024-08-15 VITALS — BP 148/88 | HR 102 | Ht 65.5 in | Wt 194.0 lb

## 2024-08-15 DIAGNOSIS — Z96653 Presence of artificial knee joint, bilateral: Secondary | ICD-10-CM

## 2024-08-15 DIAGNOSIS — H9313 Tinnitus, bilateral: Secondary | ICD-10-CM

## 2024-08-15 DIAGNOSIS — Z1159 Encounter for screening for other viral diseases: Secondary | ICD-10-CM

## 2024-08-15 DIAGNOSIS — F900 Attention-deficit hyperactivity disorder, predominantly inattentive type: Secondary | ICD-10-CM | POA: Diagnosis not present

## 2024-08-15 DIAGNOSIS — Z170 Estrogen receptor positive status [ER+]: Secondary | ICD-10-CM

## 2024-08-15 DIAGNOSIS — E038 Other specified hypothyroidism: Secondary | ICD-10-CM | POA: Diagnosis not present

## 2024-08-15 DIAGNOSIS — F341 Dysthymic disorder: Secondary | ICD-10-CM

## 2024-08-15 DIAGNOSIS — Z13220 Encounter for screening for lipoid disorders: Secondary | ICD-10-CM

## 2024-08-15 DIAGNOSIS — H900 Conductive hearing loss, bilateral: Secondary | ICD-10-CM

## 2024-08-15 DIAGNOSIS — S01511A Laceration without foreign body of lip, initial encounter: Secondary | ICD-10-CM

## 2024-08-15 DIAGNOSIS — Z96651 Presence of right artificial knee joint: Secondary | ICD-10-CM

## 2024-08-15 DIAGNOSIS — Z9849 Cataract extraction status, unspecified eye: Secondary | ICD-10-CM

## 2024-08-15 DIAGNOSIS — Z9884 Bariatric surgery status: Secondary | ICD-10-CM | POA: Diagnosis not present

## 2024-08-15 DIAGNOSIS — M818 Other osteoporosis without current pathological fracture: Secondary | ICD-10-CM

## 2024-08-15 DIAGNOSIS — C50411 Malignant neoplasm of upper-outer quadrant of right female breast: Secondary | ICD-10-CM

## 2024-08-15 DIAGNOSIS — Z860101 Personal history of adenomatous and serrated colon polyps: Secondary | ICD-10-CM

## 2024-08-15 DIAGNOSIS — D508 Other iron deficiency anemias: Secondary | ICD-10-CM

## 2024-08-15 DIAGNOSIS — Z136 Encounter for screening for cardiovascular disorders: Secondary | ICD-10-CM

## 2024-08-15 DIAGNOSIS — M1712 Unilateral primary osteoarthritis, left knee: Secondary | ICD-10-CM

## 2024-08-15 DIAGNOSIS — L309 Dermatitis, unspecified: Secondary | ICD-10-CM

## 2024-08-15 DIAGNOSIS — I471 Supraventricular tachycardia, unspecified: Secondary | ICD-10-CM

## 2024-08-15 LAB — LIPID PANEL

## 2024-08-15 MED ORDER — LISDEXAMFETAMINE DIMESYLATE 50 MG PO CAPS: 50.0000 mg | ORAL_CAPSULE | Freq: Every day | ORAL | 0 refills | Status: AC

## 2024-08-15 MED ORDER — VENLAFAXINE HCL ER 150 MG PO CP24: 150.0000 mg | ORAL_CAPSULE | Freq: Every day | ORAL | 3 refills | Status: AC

## 2024-08-15 MED ORDER — CLOBETASOL PROPIONATE 0.05 % EX CREA: 1.0000 | TOPICAL_CREAM | Freq: Two times a day (BID) | CUTANEOUS | 2 refills | Status: AC

## 2024-08-15 NOTE — Progress Notes (Signed)
 Subjective:    Patient ID: Christy [name], female    DOB: 1956-11-27, 68 y.o.   MRN: [id number]  Discussed the use of AI scribe software for clinical note transcription with the patient, who gave verbal consent to proceed.  History of Present Illness   Christy [name] is a 68 year old female who presents for a medication check and follow-up on multiple medical issues.  She is currently taking 30 mg of Adderall once daily for ADHD but finds it insufficient throughout the day. She has difficulty remembering to take it more than once daily and is interested in exploring Vyvanse  as an alternative.  She is concerned about the cost.  She has a history of bilateral knee replacements and reports no current issues with her knees.  She underwent gastric bypass surgery and uses hearing aids as needed, particularly in quieter environments. Her hearing aids fall out easily and are expensive to replace.  She has had both breasts removed and does not undergo mammograms, but sees her oncologist annually. She is on Prolia  for osteoporosis and inquired about receiving her next injection at her current location instead of the infusion center. She inquires about receiving it at her current location instead of the infusion center.  She has a history of supraventricular tachycardia (SVT) and reports one significant episode in the past year where she took an extra dose of Cardizem . She reports one significant episode of SVT in the past year where she took an extra dose of Cardizem .  She is on thyroid  medication and prefers to have her thyroid  levels checked during her oncology visit in October; she reported missing a few doses over the last week. She prefers to have her thyroid  levels checked during her oncology visit in October.  She has a history of cataract surgery.  She mentions a mid-lip laceration at the vermilion border from a fall on June 10th, which has resulted in scar tissue. She has been using  silicone gel and tape to manage the scar.  She is on Effexor  XR for mood management and requests to switch from 200 mg to 150 mg extended release due to difficulty obtaining the 200 mg dose and no noticeable difference in effect.  She uses Clobetasol  cream for general pruritus and reports being a 'fine dining for mosquitoes'.  She mentions having had a colonoscopy in 2020 with findings of tubular adenomas and is planning to schedule another one.           Review of Systems     Objective:    Physical Exam Physical Exam   CHEST: Lungs clear to auscultation.            Assessment & Plan:  Assessment and Plan    Attention Deficit Disorder Adderall 30 mg insufficient. Considering Vyvanse  for cost and efficacy. - Check Vyvanse  pricing through [employer]	[place]. - Prescribe Vyvanse  50 mg for trial. - Monitor response to Vyvanse  and adjust dosage as needed.  Dysthymia Effexor  XR reduced to 150 mg due to availability and lack of benefit at 200 mg. - Prescribe Effexor  XR 150 mg. - Discontinue Effexor  XR 200 mg.  Pruritus and Eczema Significant pruritus, possibly from mosquito bites. Clobetasol  provides relief. - Continue clobetasol  for pruritus.  Midline Lower Lip Laceration with Scar at [facility] Scar from June 10 laceration. Previous ENT evaluation unsatisfactory. Considering plastic surgery. - Refer to plastic surgery for evaluation and potential scar revision.  Supraventricular Tachycardia Occasional episodes managed with extra Cardizem . -  Continue current management with Cardizem  as needed.  Hypothyroidism Well-managed on thyroid  medication. Missed doses recently due to illness. - Defer TSH check to oncologist in October.  Osteoporosis Managed with Prolia  injections. Next injection due soon. - Administer Prolia  injection at the infusion center.  Tubular Adenomas of Colon Last colonoscopy in 2020 showed tubular adenomas. Due for follow-up. - Schedule  follow-up colonoscopy.

## 2024-08-15 NOTE — Addendum Note (Signed)
 Addended by: JOHNNYE JON SQUIBB on: 08/15/2024 02:57 PM   Modules accepted: Orders

## 2024-08-15 NOTE — Addendum Note (Signed)
 Addended by: JOHNNYE JON SQUIBB on: 08/15/2024 02:39 PM   Modules accepted: Orders

## 2024-08-16 ENCOUNTER — Ambulatory Visit: Payer: Self-pay | Admitting: Family Medicine

## 2024-08-16 LAB — COMPREHENSIVE METABOLIC PANEL WITH GFR
ALT: 18 IU/L (ref 0–32)
AST: 22 IU/L (ref 0–40)
Albumin: 3.9 g/dL (ref 3.9–4.9)
Alkaline Phosphatase: 140 IU/L — AB (ref 44–121)
BUN/Creatinine Ratio: 15 (ref 12–28)
BUN: 10 mg/dL (ref 8–27)
Bilirubin Total: 0.3 mg/dL (ref 0.0–1.2)
CO2: 24 mmol/L (ref 20–29)
Calcium: 9.5 mg/dL (ref 8.7–10.3)
Chloride: 99 mmol/L (ref 96–106)
Creatinine, Ser: 0.67 mg/dL (ref 0.57–1.00)
Globulin, Total: 2.6 g/dL (ref 1.5–4.5)
Glucose: 104 mg/dL — AB (ref 70–99)
Potassium: 4.4 mmol/L (ref 3.5–5.2)
Sodium: 137 mmol/L (ref 134–144)
Total Protein: 6.5 g/dL (ref 6.0–8.5)
eGFR: 95 mL/min/1.73 (ref >59)

## 2024-08-16 LAB — CBC WITH DIFFERENTIAL/PLATELET
Basophils Absolute: 0.1 x10E3/uL (ref 0.0–0.2)
Basos: 1 %
EOS (ABSOLUTE): 0.3 x10E3/uL (ref 0.0–0.4)
Eos: 4 %
Hematocrit: 45.8 % (ref 34.0–46.6)
Hemoglobin: 14.3 g/dL (ref 11.1–15.9)
Immature Grans (Abs): 0 x10E3/uL (ref 0.0–0.1)
Immature Granulocytes: 0 %
Lymphocytes Absolute: 1.7 x10E3/uL (ref 0.7–3.1)
Lymphs: 20 %
MCH: 29.3 pg (ref 26.6–33.0)
MCHC: 31.2 g/dL — ABNORMAL LOW (ref 31.5–35.7)
MCV: 94 fL (ref 79–97)
Monocytes Absolute: 0.7 x10E3/uL (ref 0.1–0.9)
Monocytes: 8 %
Neutrophils Absolute: 5.6 x10E3/uL (ref 1.4–7.0)
Neutrophils: 66 %
Platelets: 212 x10E3/uL (ref 150–450)
RBC: 4.88 x10E6/uL (ref 3.77–5.28)
RDW: 12.8 % (ref 11.7–15.4)
WBC: 8.4 x10E3/uL (ref 3.4–10.8)

## 2024-08-16 LAB — LIPID PANEL
Cholesterol, Total: 171 mg/dL (ref 100–199)
HDL: 51 mg/dL (ref >39)
LDL CALC COMMENT:: 3.4 ratio (ref 0.0–4.4)
LDL Chol Calc (NIH): 100 mg/dL — AB (ref 0–99)
Triglycerides: 110 mg/dL (ref 0–149)
VLDL Cholesterol Cal: 20 mg/dL (ref 5–40)

## 2024-08-16 LAB — HEPATITIS C ANTIBODY: Hep C Virus Ab: NONREACTIVE

## 2024-08-22 ENCOUNTER — Telehealth: Payer: Self-pay | Admitting: Family Medicine

## 2024-08-22 NOTE — Telephone Encounter (Signed)
P.A. VYVANSE  °

## 2024-08-23 ENCOUNTER — Other Ambulatory Visit: Payer: Self-pay | Admitting: Family Medicine

## 2024-08-23 DIAGNOSIS — F341 Dysthymic disorder: Secondary | ICD-10-CM

## 2024-08-24 ENCOUNTER — Ambulatory Visit (INDEPENDENT_AMBULATORY_CARE_PROVIDER_SITE_OTHER): Payer: Self-pay

## 2024-08-24 VITALS — BP 173/124 | HR 120 | Ht 65.5 in | Wt 196.4 lb

## 2024-08-24 DIAGNOSIS — K130 Diseases of lips: Secondary | ICD-10-CM

## 2024-08-24 NOTE — Progress Notes (Signed)
 CC: Upper lip revision due to scar at [facility] border and filtrum column after trauma.    HPI: Christy [name] is a 68 y.o. female with PMH and PSH as stated below who is a patient of Dr. Joyce who presents for evaluation of upper lip revision due to scar at [facility] border and filtrum column after trauma.    The trauma and lip laceration happened June 10th and patient was told by ER that there was no role for suturing the wounds, the wounds healed by secondary intention leaving a upper lip scar that is causing a deformity of the vermillion border and filtrum column. Previous ENT evaluation per patient was unsatisfactory and she is here for a second opinion.   Patient states this upper lip deformity is affecting her daily living, as granddaughter points at her upper lip frequently, and she is self conscious about it.   Patient is normocephalic, without any other obvious abnormality.  She is not currently smoking.    Past Medical History:  Past Medical History:  Diagnosis Date   ADD (attention deficit disorder)    Allergy    rhinitis   Anemia    Arthritis    osteo   Breast cancer (HCC) 8006,7991   Breast , skin - basal    Complication of anesthesia 2004   gastric bypass [facility] ,  acetyl succ-, hard time awaking, placed back on vent for a couiple hour   Dyspnea    GERD (gastroesophageal reflux disease)    History of hiatal hernia    History of kidney stones    passed   Hypothyroid    Internal hemorrhoids    Obesity    Pneumonia    Tubular adenoma of colon     Past Surgical History:  Past Surgical History:  Procedure Laterality Date   BREAST LUMPECTOMY Right    CESAREAN SECTION     x 2   CHOLECYSTECTOMY     COLONOSCOPY  07/2013   ESOPHAGOGASTRODUODENOSCOPY     GASTRIC BYPASS     JOINT REPLACEMENT     KNEE ARTHROSCOPY Right    x 3   LAPAROSCOPIC APPENDECTOMY N/A 04/03/2015   Procedure: APPENDECTOMY LAPAROSCOPIC;  Surgeon: Deward Null III, MD;  Location:  [facility];  Service: General;  Laterality: N/A;   LITHOTRIPSY     x3   MASTECTOMY Bilateral    with lymph node dissection  bil reconstruction 2009   TOTAL KNEE ARTHROPLASTY Right 12/18/2013   Procedure: TOTAL KNEE ARTHROPLASTY;  Surgeon: Dempsey JINNY Sensor, MD;  Location: [facility] OR;  Service: Orthopedics;  Laterality: Right;   TOTAL KNEE ARTHROPLASTY Left 06/29/2018   TOTAL KNEE ARTHROPLASTY Left 06/29/2018   Procedure: LEFT TOTAL KNEE ARTHROPLASTY;  Surgeon: Sensor Dempsey, MD;  Location: [facility] OR;  Service: Orthopedics;  Laterality: Left;   VAGINAL HYSTERECTOMY      Allergies:  Allergies  Allergen Reactions   Morphine  And Codeine Itching, Swelling and Other (See Comments)    SWELLING REACTION UNSPECIFIED    Levaquin  [Levofloxacin  In D5w] Itching   Tylenol  [Acetaminophen ] Other (See Comments)    Pt instructed NOT to have tylenol  after extensive gastric bypass     Medications:   Current Outpatient Medications:    amphetamine -dextroamphetamine  (ADDERALL) 30 MG tablet, Take 1 tablet by mouth 3 (three) times daily. (Patient taking differently: Take 30 mg by mouth daily.), Disp: 90 tablet, Rfl: 0   B Complex-C (B-COMPLEX WITH VITAMIN C) tablet, Take 1 tablet by mouth daily., Disp: ,  Rfl:    celecoxib  (CELEBREX ) 200 MG capsule, Take 200 mg by mouth daily as needed., Disp: , Rfl:    cetirizine (ZYRTEC) 10 MG tablet, , Disp: , Rfl:    Cholecalciferol  (VITAMIN D3) 5000 units CAPS, Take 5,000 Units by mouth daily. , Disp: , Rfl:    clobetasol  cream (TEMOVATE ) 0.05 %, Apply 1 Application topically 2 (two) times daily., Disp: 45 g, Rfl: 2   cyclobenzaprine  (FLEXERIL ) 10 MG tablet, Take 1 tablet (10 mg total) by mouth 3 (three) times daily as needed for muscle spasms., Disp: 30 tablet, Rfl: 1   cycloSPORINE  (RESTASIS ) 0.05 % ophthalmic emulsion, Place 2 drops into both eyes daily., Disp: , Rfl:    diltiazem  (CARDIZEM  CD) 240 MG 24 hr capsule, Take 1 capsule (240 mg total) by mouth daily., Disp: 90 capsule, Rfl:  3   fexofenadine (ALLEGRA) 180 MG tablet, Take 180 mg by mouth daily as needed for allergies or rhinitis., Disp: , Rfl:    hydrochlorothiazide  (MICROZIDE ) 12.5 MG capsule, Take 1 capsule (12.5 mg total) by mouth daily. (Patient taking differently: Take 12.5 mg by mouth as needed (taken with edema).), Disp: 90 capsule, Rfl: 3   levothyroxine  (SYNTHROID ) 50 MCG tablet, Take 1 tablet (50 mcg total) by mouth daily., Disp: 90 tablet, Rfl: 1   lisdexamfetamine (VYVANSE ) 50 MG capsule, Take 1 capsule (50 mg total) by mouth daily., Disp: 30 capsule, Rfl: 0   Multiple Vitamin (MULTIVITAMIN) capsule, Take 3 capsules by mouth daily. , Disp: , Rfl:    ondansetron  (ZOFRAN -ODT) 4 MG disintegrating tablet, Take 1 tablet (4 mg total) by mouth every 8 (eight) hours as needed for nausea or vomiting., Disp: 12 tablet, Rfl: 0   valACYclovir  (VALTREX ) 500 MG tablet, Take 1 tablet (500 mg total) by mouth daily. (Patient taking differently: Take 500 mg by mouth as needed.), Disp: 30 tablet, Rfl: 0   venlafaxine  XR (EFFEXOR  XR) 150 MG 24 hr capsule, Take 1 capsule (150 mg total) by mouth daily with breakfast., Disp: 90 capsule, Rfl: 3  Social History:  Social History   Socioeconomic History   Marital status: Divorced    Spouse name: Not on file   Number of children: 2   Years of education: Not on file   Highest education level: Not on file  Occupational History   Occupation: PA behavioral health    Employer: [employer]  Tobacco Use   Smoking status: Never   Smokeless tobacco: Never  Vaping Use   Vaping status: Never Used  Substance and Sexual Activity   Alcohol use: Yes    Alcohol/week: 7.0 standard drinks of alcohol    Types: 7 Glasses of wine per week   Drug use: No   Sexual activity: Yes    Birth control/protection: Surgical  Other Topics Concern   Not on file  Social History Narrative   Not on file   Social Drivers of Health   Financial Resource Strain: Low Risk   (08/15/2024)   Overall Financial Resource Strain (CARDIA)    Difficulty of Paying Living Expenses: Not hard at all  Food Insecurity: No Food Insecurity (08/15/2024)   Hunger Vital Sign    Worried About Running Out of Food in the Last Year: Never true    Ran Out of Food in the Last Year: Never true  Transportation Needs: No Transportation Needs (08/15/2024)   PRAPARE - Administrator, Civil Service (Medical): No    Lack of Transportation (Non-Medical):  No  Physical Activity: Inactive (08/15/2024)   Exercise Vital Sign    Days of Exercise per Week: 2 days    Minutes of Exercise per Session: 0 min  Stress: Stress Concern Present (08/15/2024)   [employer] of Occupational Health - Occupational Stress Questionnaire    Feeling of Stress: Rather much  Social Connections: Moderately Isolated (08/15/2024)   Social Connection and Isolation Panel    Frequency of Communication with Friends and Family: Three times a week    Frequency of Social Gatherings with Friends and Family: Once a week    Attends Religious Services: Never    Database administrator or Organizations: Yes    Attends Engineer, structural: More than 4 times per year    Marital Status: Divorced  Intimate Partner Violence: Not At Risk (08/15/2024)   Humiliation, Afraid, Rape, and Kick questionnaire    Fear of Current or Ex-Partner: No    Emotionally Abused: No    Physically Abused: No    Sexually Abused: No    Family History  Family History  Problem Relation Age of Onset   COPD Father    Colon polyps Mother    Atrial fibrillation Mother    Breast cancer Paternal Aunt    Colon cancer Neg Hx    Esophageal cancer Neg Hx    Rectal cancer Neg Hx    Stomach cancer Neg Hx     REVIEW OF SYSTEMS:  Review of Systems - Negative except lip scar. 10 systems evaluated.   Physical Examination  Vitals:   08/24/24 1023  BP: (!) 173/124  Pulse: (!) 120  SpO2: 99%  Weight: 196 lb 6.4 oz (89.1 kg)  Height: 5'  5.5 (1.664 m)   Patient in no distress on exam  Hemodynamically stable.  Breathing comfortably. Upper lip with scar causing a deformity of the left vermillion border (scar is pulling the vermillion border cephalad on the left upper lip) and hypertrophic scar of the left filtrum column.    Assessment and Plan:  DENESSA CAVAN is a 68 y.o. female who is a patient of Dr. Joyce who presents for evaluation of upper lip revision due to scar at vermillion border and filtrum column after trauma.    We discussed treatment algorithm. We discussed the risks, benefits and alternatives to TRL laser, lip repair and possible tissue rearrangement. We discussed the alternatives which include continued observation. We then discussed the benefits of surgical repair which include TRL to smoothen the hypertrophic filtrum column scar and  lip revision/repair with possible tissue rearrangement to align the vermillion border.. We discussed the risks of repair which include infection, bleeding, damage to surrounding healthy tissue unpleasant aesthetic results and need for further surgery. We also discussed the risks of wound separation/hypertrophy, scars and recurrence of the deformity. We discussed the scar patterns after surgery. We discussed the risks of anesthesia which will be further elaborated on by our anesthesia colleagues. The patient have a good understanding of all the risks and benefits, postoperative course and care. We obtained pictures. All questions were answered. We decided to proceed with TRL laser of filtrum column scar followed left upper lip vermillion scar revision. by Patient expressed understanding and agrees with the plan.   Nihar Klus M. Malika Demario, MD [facility]

## 2024-09-04 ENCOUNTER — Other Ambulatory Visit

## 2024-09-06 ENCOUNTER — Ambulatory Visit (INDEPENDENT_AMBULATORY_CARE_PROVIDER_SITE_OTHER): Payer: Self-pay

## 2024-09-06 VITALS — BP 154/88 | HR 80

## 2024-09-06 DIAGNOSIS — L905 Scar conditions and fibrosis of skin: Secondary | ICD-10-CM

## 2024-09-06 MED ORDER — VALACYCLOVIR HCL 1 G PO TABS: 1000.0000 mg | ORAL_TABLET | Freq: Two times a day (BID) | ORAL | 0 refills | Status: AC

## 2024-09-06 NOTE — Progress Notes (Signed)
 Preoperative Dx: Hypertrophic scar of the upper lip  Postoperative Dx:  same  Procedure: laser to upper lip   Anesthesia: none  Description of Procedure:  Risks and complications were explained to the patient. Consent was confirmed and signed. Eye protection was placed. Time out was called and all information was confirmed to be correct. The area  area was prepped with alcohol and wiped dry. Local anesthesia was injected (3 cc of lidocaine  mixed with marcaine ). The TRL laser was set at 40 J/cm2. The upper lip was lasered. The patient tolerated the procedure well and there were no complications. The patient is to follow up in 4 weeks.  Averi Kilty M. Devetta Hagenow, MD [facility]

## 2024-09-06 NOTE — Addendum Note (Signed)
 Addended by: EUSTACIO POUR on: 09/06/2024 04:26 PM   Modules accepted: Orders

## 2024-09-21 ENCOUNTER — Encounter: Payer: Self-pay | Admitting: Family Medicine

## 2024-09-21 ENCOUNTER — Ambulatory Visit (INDEPENDENT_AMBULATORY_CARE_PROVIDER_SITE_OTHER): Admitting: Family Medicine

## 2024-09-21 ENCOUNTER — Ambulatory Visit: Admitting: Family Medicine

## 2024-09-21 VITALS — BP 124/70 | HR 84 | Ht 65.0 in | Wt 200.0 lb

## 2024-09-21 DIAGNOSIS — E66811 Obesity, class 1: Secondary | ICD-10-CM

## 2024-09-21 DIAGNOSIS — F900 Attention-deficit hyperactivity disorder, predominantly inattentive type: Secondary | ICD-10-CM | POA: Diagnosis not present

## 2024-09-21 DIAGNOSIS — Z23 Encounter for immunization: Secondary | ICD-10-CM | POA: Diagnosis not present

## 2024-09-21 DIAGNOSIS — Z9884 Bariatric surgery status: Secondary | ICD-10-CM | POA: Diagnosis not present

## 2024-09-21 NOTE — Progress Notes (Signed)
   Subjective:    Patient ID: Christy [name], female    DOB: 03/08/1956, 68 y.o.   MRN: [id number]  HPI She is here for consult concerning her weight and possibly getting on a GLP-1.  She has had previous gastric bypass surgery she is on Medicare.  She does not have diabetes.  She also has underlying ADHD and seems to be doing well on her present dosing of Vyvanse .   Review of Systems     Objective:    Physical Exam Alert and in no distress otherwise not examined      Assessment & Plan:  Need for influenza vaccination - Plan: Flu vaccine HIGH DOSE PF(Fluzone Trivalent)  Attention deficit hyperactivity disorder (ADHD), predominantly inattentive type  History of Roux-en-Y gastric bypass  Obesity (BMI 30.0-34.9) I discussed options with her concerning getting on a GLP-1.  Recommend that she contact [email or web address] to see if she would qualify without radicular program which seems to be under $200 per month.  She will keep me informed.

## 2024-09-23 ENCOUNTER — Other Ambulatory Visit: Payer: Self-pay | Admitting: Family Medicine

## 2024-09-23 DIAGNOSIS — E039 Hypothyroidism, unspecified: Secondary | ICD-10-CM

## 2024-09-27 ENCOUNTER — Ambulatory Visit

## 2024-09-27 MED ORDER — DENOSUMAB 60 MG/ML ~~LOC~~ SOSY: 60.0000 mg | PREFILLED_SYRINGE | Freq: Once | SUBCUTANEOUS | Status: DC

## 2024-09-28 ENCOUNTER — Encounter: Payer: Self-pay | Admitting: Hematology and Oncology

## 2024-09-28 ENCOUNTER — Encounter: Payer: Self-pay | Admitting: Family Medicine

## 2024-10-03 ENCOUNTER — Encounter: Payer: Self-pay | Admitting: Surgical

## 2024-10-03 ENCOUNTER — Ambulatory Visit (INDEPENDENT_AMBULATORY_CARE_PROVIDER_SITE_OTHER): Admitting: Surgical

## 2024-10-03 VITALS — BP 145/83 | HR 91 | Wt 199.0 lb

## 2024-10-03 DIAGNOSIS — K130 Diseases of lips: Secondary | ICD-10-CM | POA: Diagnosis not present

## 2024-10-03 DIAGNOSIS — Z9884 Bariatric surgery status: Secondary | ICD-10-CM

## 2024-10-03 DIAGNOSIS — D509 Iron deficiency anemia, unspecified: Secondary | ICD-10-CM

## 2024-10-03 DIAGNOSIS — S0993XD Unspecified injury of face, subsequent encounter: Secondary | ICD-10-CM | POA: Diagnosis not present

## 2024-10-03 DIAGNOSIS — L905 Scar conditions and fibrosis of skin: Secondary | ICD-10-CM | POA: Diagnosis not present

## 2024-10-03 NOTE — Progress Notes (Signed)
 Patient ID: Christy [name], female    DOB: Jan 04, 1956, 68 y.o.   MRN: [id number]  Chief Complaint  Patient presents with   Pre-op Exam      ICD-10-CM   1. 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 Lip deformity, acquired  K13.0     3. Iron  deficiency anemia, unspecified iron  deficiency anemia type  D50.9     4. History of Roux-en-Y gastric bypass  Z98.84      History of Present Illness: Christy [name] is a 68 y.o.  female  with a history of lip trauma with resulting scar.  She presents for preoperative evaluation for upcoming procedure, Lip scar revision and possible tissue rearrangement, scheduled for 10/23/24 with Dr. Montorfano.  Patient reports 1 episode of complications with anesthesia, per EMR review, had a hard time waking up from anesthesia after her gastric bypass at [facility] in 2004.  Which required her to be placed back on the vent for a few hours.  Reportedly issues with acetyl succinylcholine .  Patient reports she has had anesthesia since that procedure and did not have any issues or complications. No history of DVT/PE.  No family history of DVT/PE.  No family or personal history of bleeding or clotting disorders.  Patient is not currently taking any blood thinners.  No history of CVA/MI.  Patient does not have any history of greater than 3 miscarriages, no history of Crohn's or ulcerative colitis.  She is not on any hormone medications.  She is not a smoker.  She has not had any recent changes in her health in the past month.  No history of COPD or asthma.  Patient does not report any history of varicose veins or swelling in her legs normally.  She does have a history of breast cancer in 1993 and 2004.  Has had double mastectomies.  She did recently start on Tirzepatide -she doses this on Sundays.  Summary of Previous Visit: Patient with history of scar at vermilion border and philtrum column after trauma.  This occurred June 10.  She  had an incisional laceration that healed by secondary intention leaving a lip scar with a resulting deformity.  Patient underwent TRL laser on 09/06/2024.  Job: Advice worker, PRN. No forms requested  PMH Significant for: Anemia, breast cancer, history of anesthesia complication, GERD, hypothyroid, tubular adenoma of colon.  Patient has been feeling well lately with no recent changes to her health.  She feels well prepared for surgery.  She does report that she gets anxiety in the perioperative period and is requesting an anxiolytic if possible.   Patient request that she does not have any IVs or blood pressure cuffs placed on her right arm due to previous mastectomy and lymph node removal.   Past Medical History: Allergies: Allergies  Allergen Reactions   Morphine  And Codeine Itching, Swelling and Other (See Comments)    SWELLING REACTION UNSPECIFIED    Levaquin  [Levofloxacin  In D5w] Itching   Tylenol  [Acetaminophen ] Other (See Comments)    Pt instructed NOT to have tylenol  after extensive gastric bypass    Current Medications:  Current Outpatient Medications:    B Complex-C (B-COMPLEX WITH VITAMIN C) tablet, Take 1 tablet by mouth daily., Disp: , Rfl:    celecoxib  (CELEBREX ) 200 MG capsule, Take 200 mg by mouth daily as needed., Disp: , Rfl:    cetirizine (ZYRTEC) 10 MG tablet, , Disp: , Rfl:  Cholecalciferol  (VITAMIN D3) 5000 units CAPS, Take 5,000 Units by mouth daily. , Disp: , Rfl:    clobetasol  cream (TEMOVATE ) 0.05 %, Apply 1 Application topically 2 (two) times daily., Disp: 45 g, Rfl: 2   cyclobenzaprine  (FLEXERIL ) 10 MG tablet, Take 1 tablet (10 mg total) by mouth 3 (three) times daily as needed for muscle spasms., Disp: 30 tablet, Rfl: 1   cycloSPORINE  (RESTASIS ) 0.05 % ophthalmic emulsion, Place 2 drops into both eyes daily., Disp: , Rfl:    diltiazem  (CARDIZEM  CD) 240 MG 24 hr capsule, Take 1 capsule (240 mg total) by mouth daily., Disp: 90 capsule, Rfl: 3    fexofenadine (ALLEGRA) 180 MG tablet, Take 180 mg by mouth daily as needed for allergies or rhinitis., Disp: , Rfl:    levothyroxine  (SYNTHROID ) 50 MCG tablet, TAKE 1 TABLET(50 MCG) BY MOUTH DAILY, Disp: 90 tablet, Rfl: 1   lisdexamfetamine (VYVANSE ) 50 MG capsule, Take 1 capsule (50 mg total) by mouth daily., Disp: 30 capsule, Rfl: 0   Multiple Vitamin (MULTIVITAMIN) capsule, Take 3 capsules by mouth daily. , Disp: , Rfl:    ondansetron  (ZOFRAN -ODT) 4 MG disintegrating tablet, Take 1 tablet (4 mg total) by mouth every 8 (eight) hours as needed for nausea or vomiting., Disp: 12 tablet, Rfl: 0   valACYclovir  (VALTREX ) 500 MG tablet, Take 1 tablet (500 mg total) by mouth daily. (Patient taking differently: Take 500 mg by mouth as needed.), Disp: 30 tablet, Rfl: 0   venlafaxine  XR (EFFEXOR  XR) 150 MG 24 hr capsule, Take 1 capsule (150 mg total) by mouth daily with breakfast., Disp: 90 capsule, Rfl: 3   amphetamine -dextroamphetamine  (ADDERALL) 30 MG tablet, Take 1 tablet by mouth 3 (three) times daily. (Patient not taking: Reported on 10/03/2024), Disp: 90 tablet, Rfl: 0   hydrochlorothiazide  (MICROZIDE ) 12.5 MG capsule, Take 1 capsule (12.5 mg total) by mouth daily. (Patient taking differently: Take 12.5 mg by mouth as needed (taken with edema).), Disp: 90 capsule, Rfl: 3  Past Medical Problems: Past Medical History:  Diagnosis Date   ADD (attention deficit disorder)    Allergy    rhinitis   Anemia    Arthritis    osteo   Breast cancer (HCC) [id number]   Breast , skin - basal    Complication of anesthesia 2004   gastric bypass [facility] ,  acetyl succ-, hard time awaking, placed back on vent for a couiple hour   Dyspnea    GERD (gastroesophageal reflux disease)    History of hiatal hernia    History of kidney stones    passed   Hypothyroid    Internal hemorrhoids    Obesity    Pneumonia    Tubular adenoma of colon     Past Surgical History: Past Surgical History:  Procedure  Laterality Date   BREAST LUMPECTOMY Right    CESAREAN SECTION     x 2   CHOLECYSTECTOMY     COLONOSCOPY  07/2013   ESOPHAGOGASTRODUODENOSCOPY     GASTRIC BYPASS     JOINT REPLACEMENT     KNEE ARTHROSCOPY Right    x 3   LAPAROSCOPIC APPENDECTOMY N/A 04/03/2015   Procedure: APPENDECTOMY LAPAROSCOPIC;  Surgeon: Deward Null III, MD;  Location: [facility] ORS;  Service: General;  Laterality: N/A;   LITHOTRIPSY     x3   MASTECTOMY Bilateral    with lymph node dissection  bil reconstruction 2009   TOTAL KNEE ARTHROPLASTY Right 12/18/2013   Procedure: TOTAL KNEE ARTHROPLASTY;  Surgeon: Dempsey JINNY Sensor, MD;  Location[facility];  Service: Orthopedics;  Laterality: Right;   TOTAL KNEE ARTHROPLASTY Left 06/29/2018   TOTAL KNEE ARTHROPLASTY Left 06/29/2018   Procedure: LEFT TOTAL KNEE ARTHROPLASTY;  Surgeon: Sensor Dempsey, MD;  Location: [facility] OR;  Service: Orthopedics;  Laterality: Left;   VAGINAL HYSTERECTOMY      Social History: Social History   Socioeconomic History   Marital status: Divorced    Spouse name: Not on file   Number of children: 2   Years of education: Not on file   Highest education level: Not on file  Occupational History   Occupation: PA behavioral health    Employer: [employer]  Tobacco Use   Smoking status: Never   Smokeless tobacco: Never  Vaping Use   Vaping status: Never Used  Substance and Sexual Activity   Alcohol use: Yes    Alcohol/week: 7.0 standard drinks of alcohol    Types: 7 Glasses of wine per week   Drug use: No   Sexual activity: Yes    Birth control/protection: Surgical  Other Topics Concern   Not on file  Social History Narrative   Not on file   Social Drivers of Health   Financial Resource Strain: Low Risk  (08/15/2024)   Overall Financial Resource Strain (CARDIA)    Difficulty of Paying Living Expenses: Not hard at all  Food Insecurity: No Food Insecurity (08/15/2024)   Hunger Vital Sign    Worried About Running Out of Food in  the Last Year: Never true    Ran Out of Food in the Last Year: Never true  Transportation Needs: No Transportation Needs (08/15/2024)   PRAPARE - Administrator, Civil Service (Medical): No    Lack of Transportation (Non-Medical): No  Physical Activity: Inactive (08/15/2024)   Exercise Vital Sign    Days of Exercise per Week: 2 days    Minutes of Exercise per Session: 0 min  Stress: Stress Concern Present (08/15/2024)   [employer] of Occupational Health - Occupational Stress Questionnaire    Feeling of Stress: Rather much  Social Connections: Moderately Isolated (08/15/2024)   Social Connection and Isolation Panel    Frequency of Communication with Friends and Family: Three times a week    Frequency of Social Gatherings with Friends and Family: Once a week    Attends Religious Services: Never    Database administrator or Organizations: Yes    Attends Engineer, structural: More than 4 times per year    Marital Status: Divorced  Intimate Partner Violence: Not At Risk (08/15/2024)   Humiliation, Afraid, Rape, and Kick questionnaire    Fear of Current or Ex-Partner: No    Emotionally Abused: No    Physically Abused: No    Sexually Abused: No    Family History: Family History  Problem Relation Age of Onset   COPD Father    Colon polyps Mother    Atrial fibrillation Mother    Breast cancer Paternal Aunt    Colon cancer Neg Hx    Esophageal cancer Neg Hx    Rectal cancer Neg Hx    Stomach cancer Neg Hx     Review of Systems: Review of Systems  Constitutional: Negative.   Respiratory: Negative.    Cardiovascular: Negative.   Gastrointestinal: Negative.   Neurological: Negative.     Physical Exam: Vital Signs BP (!) 145/83 (BP Location: Left Arm, Patient Position: Sitting, Cuff Size: Large)  Pulse 91   Wt 199 lb (90.3 kg)   SpO2 95%   BMI 33.12 kg/m   Physical Exam Constitutional:      General: Not in acute distress.    Appearance: Normal  appearance. Not ill-appearing.  HENT:     Head: Normocephalic and atraumatic.  Lip scar noted. Eyes:     Pupils: Pupils are equal, round Neck:     Musculoskeletal: Normal range of motion.  Cardiovascular:     Rate and Rhythm: Normal rate    Pulses: Normal pulses.  Pulmonary:     Effort: Pulmonary effort is normal. No respiratory distress.  Abdominal:     General: Abdomen is flat. There is no distension.  Musculoskeletal: Normal range of motion.  Skin:    General: Skin is warm and dry.     Findings: No erythema or rash.  Neurological:     General: No focal deficit present.     Mental Status: Alert and oriented to person, place, and time. Mental status is at baseline.     Motor: No weakness.  Psychiatric:        Mood and Affect: Mood normal.        Behavior: Behavior normal.    Assessment/Plan: The patient is scheduled for lip scar revision and possible tissue rearrangement with Dr. Montorfano.  Risks, benefits, and alternatives of procedure discussed, questions answered and consent obtained.    Smoking Status: Non-smoker; Counseling Given?  N/A  Caprini Score: 7; Risk Factors include: Age, history of breast cancer, BMI > 25, and length of planned surgery. Recommendation for mechanical prophylaxis. Encourage early ambulation.   Pictures obtained: @consult   Post-op Rx sent to pharmacy: Tramadol, Zofran   Patient was provided with the General Surgical Risk consent document and Pain Medication Agreement prior to their appointment.  They had adequate time to read through the risk consent documents and Pain Medication Agreement. We also discussed them in person together during this preop appointment. All of their questions were answered to their satisfaction.  Recommended calling if they have any further questions.  Risk consent form and Pain Medication Agreement to be scanned into patient's chart.  Discussed with patient specific risks in regards to lip scar revision including but  not limited to sensory changes, damage to surrounding structures such as blood vessels, nerves or muscles resulting in long-term or short-term sensory and functional changes.  Discussed risks of bruising, swelling, bleeding, infection.  We reviewed the consent form today and all of her questions were answered to her content.  Discussed with patient to hold Tirzepatide at a minimum 1 week prior to surgery.  She typically doses on Sundays and so she is going to stop on 10/15/2024 and restart after surgery.  We also discussed holding multivitamins and vitamin D  1 week prior to surgery to decrease risk of bleeding.  Patient is medically optimized from a plastic surgery standpoint and cleared to proceed with surgery.   Electronically signed by: Donnice PARAS Shonda Mandarino, PA-C 10/03/2024 10:57 AM

## 2024-10-03 NOTE — H&P (View-Only) (Signed)
 Patient ID: Christy [name], female    DOB: Jan 04, 1956, 68 y.o.   MRN: [id number]  Chief Complaint  Patient presents with   Pre-op Exam      ICD-10-CM   1. 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 Lip deformity, acquired  K13.0     3. Iron  deficiency anemia, unspecified iron  deficiency anemia type  D50.9     4. History of Roux-en-Y gastric bypass  Z98.84      History of Present Illness: Christy [name] is a 68 y.o.  female  with a history of lip trauma with resulting scar.  She presents for preoperative evaluation for upcoming procedure, Lip scar revision and possible tissue rearrangement, scheduled for 10/23/24 with Dr. Montorfano.  Patient reports 1 episode of complications with anesthesia, per EMR review, had a hard time waking up from anesthesia after her gastric bypass at [facility] in 2004.  Which required her to be placed back on the vent for a few hours.  Reportedly issues with acetyl succinylcholine .  Patient reports she has had anesthesia since that procedure and did not have any issues or complications. No history of DVT/PE.  No family history of DVT/PE.  No family or personal history of bleeding or clotting disorders.  Patient is not currently taking any blood thinners.  No history of CVA/MI.  Patient does not have any history of greater than 3 miscarriages, no history of Crohn's or ulcerative colitis.  She is not on any hormone medications.  She is not a smoker.  She has not had any recent changes in her health in the past month.  No history of COPD or asthma.  Patient does not report any history of varicose veins or swelling in her legs normally.  She does have a history of breast cancer in 1993 and 2004.  Has had double mastectomies.  She did recently start on Tirzepatide -she doses this on Sundays.  Summary of Previous Visit: Patient with history of scar at vermilion border and philtrum column after trauma.  This occurred June 10.  She  had an incisional laceration that healed by secondary intention leaving a lip scar with a resulting deformity.  Patient underwent TRL laser on 09/06/2024.  Job: Advice worker, PRN. No forms requested  PMH Significant for: Anemia, breast cancer, history of anesthesia complication, GERD, hypothyroid, tubular adenoma of colon.  Patient has been feeling well lately with no recent changes to her health.  She feels well prepared for surgery.  She does report that she gets anxiety in the perioperative period and is requesting an anxiolytic if possible.   Patient request that she does not have any IVs or blood pressure cuffs placed on her right arm due to previous mastectomy and lymph node removal.   Past Medical History: Allergies: Allergies  Allergen Reactions   Morphine  And Codeine Itching, Swelling and Other (See Comments)    SWELLING REACTION UNSPECIFIED    Levaquin  [Levofloxacin  In D5w] Itching   Tylenol  [Acetaminophen ] Other (See Comments)    Pt instructed NOT to have tylenol  after extensive gastric bypass    Current Medications:  Current Outpatient Medications:    B Complex-C (B-COMPLEX WITH VITAMIN C) tablet, Take 1 tablet by mouth daily., Disp: , Rfl:    celecoxib  (CELEBREX ) 200 MG capsule, Take 200 mg by mouth daily as needed., Disp: , Rfl:    cetirizine (ZYRTEC) 10 MG tablet, , Disp: , Rfl:  Cholecalciferol  (VITAMIN D3) 5000 units CAPS, Take 5,000 Units by mouth daily. , Disp: , Rfl:    clobetasol  cream (TEMOVATE ) 0.05 %, Apply 1 Application topically 2 (two) times daily., Disp: 45 g, Rfl: 2   cyclobenzaprine  (FLEXERIL ) 10 MG tablet, Take 1 tablet (10 mg total) by mouth 3 (three) times daily as needed for muscle spasms., Disp: 30 tablet, Rfl: 1   cycloSPORINE  (RESTASIS ) 0.05 % ophthalmic emulsion, Place 2 drops into both eyes daily., Disp: , Rfl:    diltiazem  (CARDIZEM  CD) 240 MG 24 hr capsule, Take 1 capsule (240 mg total) by mouth daily., Disp: 90 capsule, Rfl: 3    fexofenadine (ALLEGRA) 180 MG tablet, Take 180 mg by mouth daily as needed for allergies or rhinitis., Disp: , Rfl:    levothyroxine  (SYNTHROID ) 50 MCG tablet, TAKE 1 TABLET(50 MCG) BY MOUTH DAILY, Disp: 90 tablet, Rfl: 1   lisdexamfetamine (VYVANSE ) 50 MG capsule, Take 1 capsule (50 mg total) by mouth daily., Disp: 30 capsule, Rfl: 0   Multiple Vitamin (MULTIVITAMIN) capsule, Take 3 capsules by mouth daily. , Disp: , Rfl:    ondansetron  (ZOFRAN -ODT) 4 MG disintegrating tablet, Take 1 tablet (4 mg total) by mouth every 8 (eight) hours as needed for nausea or vomiting., Disp: 12 tablet, Rfl: 0   valACYclovir  (VALTREX ) 500 MG tablet, Take 1 tablet (500 mg total) by mouth daily. (Patient taking differently: Take 500 mg by mouth as needed.), Disp: 30 tablet, Rfl: 0   venlafaxine  XR (EFFEXOR  XR) 150 MG 24 hr capsule, Take 1 capsule (150 mg total) by mouth daily with breakfast., Disp: 90 capsule, Rfl: 3   amphetamine -dextroamphetamine  (ADDERALL) 30 MG tablet, Take 1 tablet by mouth 3 (three) times daily. (Patient not taking: Reported on 10/03/2024), Disp: 90 tablet, Rfl: 0   hydrochlorothiazide  (MICROZIDE ) 12.5 MG capsule, Take 1 capsule (12.5 mg total) by mouth daily. (Patient taking differently: Take 12.5 mg by mouth as needed (taken with edema).), Disp: 90 capsule, Rfl: 3  Past Medical Problems: Past Medical History:  Diagnosis Date   ADD (attention deficit disorder)    Allergy    rhinitis   Anemia    Arthritis    osteo   Breast cancer (HCC) [phone]   Breast , skin - basal    Complication of anesthesia 2004   gastric bypass [facility] ,  acetyl succ-, hard time awaking, placed back on vent for a couiple hour   Dyspnea    GERD (gastroesophageal reflux disease)    History of hiatal hernia    History of kidney stones    passed   Hypothyroid    Internal hemorrhoids    Obesity    Pneumonia    Tubular adenoma of colon     Past Surgical History: Past Surgical History:  Procedure  Laterality Date   BREAST LUMPECTOMY Right    CESAREAN SECTION     x 2   CHOLECYSTECTOMY     COLONOSCOPY  07/2013   ESOPHAGOGASTRODUODENOSCOPY     GASTRIC BYPASS     JOINT REPLACEMENT     KNEE ARTHROSCOPY Right    x 3   LAPAROSCOPIC APPENDECTOMY N/A 04/03/2015   Procedure: APPENDECTOMY LAPAROSCOPIC;  Surgeon: Deward Null III, MD;  Location: [facility] ORS;  Service: General;  Laterality: N/A;   LITHOTRIPSY     x3   MASTECTOMY Bilateral    with lymph node dissection  bil reconstruction 2009   TOTAL KNEE ARTHROPLASTY Right 12/18/2013   Procedure: TOTAL KNEE ARTHROPLASTY;  Surgeon: Dempsey JINNY Sensor, MD;  Location[facility];  Service: Orthopedics;  Laterality: Right;   TOTAL KNEE ARTHROPLASTY Left 06/29/2018   TOTAL KNEE ARTHROPLASTY Left 06/29/2018   Procedure: LEFT TOTAL KNEE ARTHROPLASTY;  Surgeon: Sensor Dempsey, MD;  Location: [facility] OR;  Service: Orthopedics;  Laterality: Left;   VAGINAL HYSTERECTOMY      Social History: Social History   Socioeconomic History   Marital status: Divorced    Spouse name: Not on file   Number of children: 2   Years of education: Not on file   Highest education level: Not on file  Occupational History   Occupation: PA behavioral health    Employer: [employer]  Tobacco Use   Smoking status: Never   Smokeless tobacco: Never  Vaping Use   Vaping status: Never Used  Substance and Sexual Activity   Alcohol use: Yes    Alcohol/week: 7.0 standard drinks of alcohol    Types: 7 Glasses of wine per week   Drug use: No   Sexual activity: Yes    Birth control/protection: Surgical  Other Topics Concern   Not on file  Social History Narrative   Not on file   Social Drivers of Health   Financial Resource Strain: Low Risk  (08/15/2024)   Overall Financial Resource Strain (CARDIA)    Difficulty of Paying Living Expenses: Not hard at all  Food Insecurity: No Food Insecurity (08/15/2024)   Hunger Vital Sign    Worried About Running Out of Food in  the Last Year: Never true    Ran Out of Food in the Last Year: Never true  Transportation Needs: No Transportation Needs (08/15/2024)   PRAPARE - Administrator, Civil Service (Medical): No    Lack of Transportation (Non-Medical): No  Physical Activity: Inactive (08/15/2024)   Exercise Vital Sign    Days of Exercise per Week: 2 days    Minutes of Exercise per Session: 0 min  Stress: Stress Concern Present (08/15/2024)   [employer] of Occupational Health - Occupational Stress Questionnaire    Feeling of Stress: Rather much  Social Connections: Moderately Isolated (08/15/2024)   Social Connection and Isolation Panel    Frequency of Communication with Friends and Family: Three times a week    Frequency of Social Gatherings with Friends and Family: Once a week    Attends Religious Services: Never    Database administrator or Organizations: Yes    Attends Engineer, structural: More than 4 times per year    Marital Status: Divorced  Intimate Partner Violence: Not At Risk (08/15/2024)   Humiliation, Afraid, Rape, and Kick questionnaire    Fear of Current or Ex-Partner: No    Emotionally Abused: No    Physically Abused: No    Sexually Abused: No    Family History: Family History  Problem Relation Age of Onset   COPD Father    Colon polyps Mother    Atrial fibrillation Mother    Breast cancer Paternal Aunt    Colon cancer Neg Hx    Esophageal cancer Neg Hx    Rectal cancer Neg Hx    Stomach cancer Neg Hx     Review of Systems: Review of Systems  Constitutional: Negative.   Respiratory: Negative.    Cardiovascular: Negative.   Gastrointestinal: Negative.   Neurological: Negative.     Physical Exam: Vital Signs BP (!) 145/83 (BP Location: Left Arm, Patient Position: Sitting, Cuff Size: Large)  Pulse 91   Wt 199 lb (90.3 kg)   SpO2 95%   BMI 33.12 kg/m   Physical Exam Constitutional:      General: Not in acute distress.    Appearance: Normal  appearance. Not ill-appearing.  HENT:     Head: Normocephalic and atraumatic.  Lip scar noted. Eyes:     Pupils: Pupils are equal, round Neck:     Musculoskeletal: Normal range of motion.  Cardiovascular:     Rate and Rhythm: Normal rate    Pulses: Normal pulses.  Pulmonary:     Effort: Pulmonary effort is normal. No respiratory distress.  Abdominal:     General: Abdomen is flat. There is no distension.  Musculoskeletal: Normal range of motion.  Skin:    General: Skin is warm and dry.     Findings: No erythema or rash.  Neurological:     General: No focal deficit present.     Mental Status: Alert and oriented to person, place, and time. Mental status is at baseline.     Motor: No weakness.  Psychiatric:        Mood and Affect: Mood normal.        Behavior: Behavior normal.    Assessment/Plan: The patient is scheduled for lip scar revision and possible tissue rearrangement with Dr. Montorfano.  Risks, benefits, and alternatives of procedure discussed, questions answered and consent obtained.    Smoking Status: Non-smoker; Counseling Given?  N/A  Caprini Score: 7; Risk Factors include: Age, history of breast cancer, BMI > 25, and length of planned surgery. Recommendation for mechanical prophylaxis. Encourage early ambulation.   Pictures obtained: @consult   Post-op Rx sent to pharmacy: Tramadol, Zofran   Patient was provided with the General Surgical Risk consent document and Pain Medication Agreement prior to their appointment.  They had adequate time to read through the risk consent documents and Pain Medication Agreement. We also discussed them in person together during this preop appointment. All of their questions were answered to their satisfaction.  Recommended calling if they have any further questions.  Risk consent form and Pain Medication Agreement to be scanned into patient's chart.  Discussed with patient specific risks in regards to lip scar revision including but  not limited to sensory changes, damage to surrounding structures such as blood vessels, nerves or muscles resulting in long-term or short-term sensory and functional changes.  Discussed risks of bruising, swelling, bleeding, infection.  We reviewed the consent form today and all of her questions were answered to her content.  Discussed with patient to hold Tirzepatide at a minimum 1 week prior to surgery.  She typically doses on Sundays and so she is going to stop on 10/15/2024 and restart after surgery.  We also discussed holding multivitamins and vitamin D  1 week prior to surgery to decrease risk of bleeding.  Patient is medically optimized from a plastic surgery standpoint and cleared to proceed with surgery.   Electronically signed by: Donnice PARAS Shonda Mandarino, PA-C 10/03/2024 10:57 AM

## 2024-10-09 NOTE — Telephone Encounter (Signed)
 Discussed this previously with Dr. Joyce that P.A. denied not on formulary,  preferred drugs are IR dexmethylphenidate (Focalin)cost $8 & Atomoxetine (Straterra)cost [phone], he will discuss with pt and also advised Vyvanse  generic with [facility] cost [phone]

## 2024-10-10 ENCOUNTER — Encounter

## 2024-10-10 ENCOUNTER — Telehealth: Payer: Self-pay | Admitting: Surgical

## 2024-10-10 NOTE — Telephone Encounter (Signed)
 needs to be r/s provider in sx called [employer]mail 10-10-24

## 2024-10-11 ENCOUNTER — Telehealth: Payer: Self-pay

## 2024-10-11 NOTE — Telephone Encounter (Signed)
 Patient no showed pre visit on 11/10/24.  Visit was not rescheduled, so pre visit and colonoscopy were cancelled and letter sent in My Chart and in the mail.

## 2024-10-18 ENCOUNTER — Encounter (HOSPITAL_BASED_OUTPATIENT_CLINIC_OR_DEPARTMENT_OTHER): Payer: Self-pay

## 2024-10-18 ENCOUNTER — Other Ambulatory Visit: Payer: Self-pay

## 2024-10-19 ENCOUNTER — Encounter (HOSPITAL_BASED_OUTPATIENT_CLINIC_OR_DEPARTMENT_OTHER)
Admission: RE | Admit: 2024-10-19 | Discharge: 2024-10-19 | Disposition: A | Discharge status: Home / Self Care | Source: Ambulatory Visit

## 2024-10-19 DIAGNOSIS — Z01818 Encounter for other preprocedural examination: Secondary | ICD-10-CM | POA: Diagnosis present

## 2024-10-19 DIAGNOSIS — I471 Supraventricular tachycardia, unspecified: Secondary | ICD-10-CM | POA: Insufficient documentation

## 2024-10-19 LAB — BASIC METABOLIC PANEL WITH GFR
Anion gap: 8 (ref 5–15)
BUN: 8 mg/dL (ref 8–23)
CO2: 25 mmol/L (ref 22–32)
Calcium: 9.1 mg/dL (ref 8.9–10.3)
Chloride: 107 mmol/L (ref 98–111)
Creatinine, Ser: 0.9 mg/dL (ref 0.44–1.00)
GFR, Estimated: >60 mL/min (ref >60)
Glucose, Bld: 102 mg/dL — ABNORMAL HIGH (ref 70–99)
Potassium: 4.5 mmol/L (ref 3.5–5.1)
Sodium: 140 mmol/L (ref 135–145)

## 2024-10-20 NOTE — Anesthesia Preprocedure Evaluation (Addendum)
 Anesthesia Evaluation  Patient identified by MRN, date of birth, ID band Patient awake    Reviewed: Allergy & Precautions, NPO status , Patient's Chart, lab work & pertinent test results  History of Anesthesia Complications (+) history of anesthetic complications (required reintubation after gastric bypass 2004- has had roc and succ since then)  Airway Mallampati: IV  TM Distance: >3 FB Neck ROM: Full    Dental  (+) Dental Advisory Given, Edentulous Upper, Edentulous Lower   Pulmonary neg pulmonary ROS   Pulmonary exam normal breath sounds clear to auscultation       Cardiovascular hypertension (BP 15768 preop), Normal cardiovascular exam+ dysrhythmias (PSVT managed w/ cardizem ,) Supra Ventricular Tachycardia  Rhythm:Regular Rate:Normal     Neuro/Psych negative neurological ROS  negative psych ROS   GI/Hepatic Neg liver ROS, hiatal hernia,GERD  Controlled,,S/p gastric bypass 2004   Endo/Other  Hypothyroidism  BMI 32  Renal/GU negative Renal ROS  negative genitourinary   Musculoskeletal  (+) Arthritis , Osteoarthritis,    Abdominal  (+) + obese  Peds  Hematology negative hematology ROS (+)   Anesthesia Other Findings Zepbound LD: 10/19  Reproductive/Obstetrics negative OB ROS                              Anesthesia Physical Anesthesia Plan  ASA: 2  Anesthesia Plan: General   Post-op Pain Management: Tylenol  PO (pre-op)*   Induction: Intravenous  PONV Risk Score and Plan: 3 and Ondansetron , Dexamethasone , Midazolam  and Treatment may vary due to age or medical condition  Airway Management Planned: Oral ETT and Video Laryngoscope Planned  Additional Equipment: None  Intra-op Plan:   Post-operative Plan: Extubation in OR  Informed Consent: I have reviewed the patients History and Physical, chart, labs and discussed the procedure including the risks, benefits and alternatives for  the proposed anesthesia with the patient or authorized representative who has indicated his/her understanding and acceptance.     Dental advisory given  Plan Discussed with: CRNA  Anesthesia Plan Comments: (Last airway note (from before her facial trauma): Laryngoscope Size: Mac and 4 Grade View: Grade I Tube type: Oral Tube size: 7.5 mm Number of attempts: 1  Now Mall4, small mouth opening. will remove dentures and glidescope)         Anesthesia Quick Evaluation

## 2024-10-21 ENCOUNTER — Other Ambulatory Visit: Payer: Self-pay | Admitting: General Practice

## 2024-10-23 ENCOUNTER — Ambulatory Visit (HOSPITAL_BASED_OUTPATIENT_CLINIC_OR_DEPARTMENT_OTHER): Payer: Self-pay | Admitting: Anesthesiology

## 2024-10-23 ENCOUNTER — Encounter (HOSPITAL_BASED_OUTPATIENT_CLINIC_OR_DEPARTMENT_OTHER): Payer: Self-pay

## 2024-10-23 ENCOUNTER — Ambulatory Visit (HOSPITAL_BASED_OUTPATIENT_CLINIC_OR_DEPARTMENT_OTHER): Admission: RE | Admit: 2024-10-23 | Discharge: 2024-10-23 | Disposition: A | Discharge status: Home / Self Care

## 2024-10-23 ENCOUNTER — Encounter (HOSPITAL_BASED_OUTPATIENT_CLINIC_OR_DEPARTMENT_OTHER): Admission: RE | Disposition: A | Discharge status: Home / Self Care | Payer: Self-pay | Source: Home / Self Care

## 2024-10-23 ENCOUNTER — Other Ambulatory Visit: Payer: Self-pay

## 2024-10-23 DIAGNOSIS — Z9013 Acquired absence of bilateral breasts and nipples: Secondary | ICD-10-CM | POA: Diagnosis not present

## 2024-10-23 DIAGNOSIS — I10 Essential (primary) hypertension: Secondary | ICD-10-CM | POA: Insufficient documentation

## 2024-10-23 DIAGNOSIS — E039 Hypothyroidism, unspecified: Secondary | ICD-10-CM | POA: Diagnosis not present

## 2024-10-23 DIAGNOSIS — Z9884 Bariatric surgery status: Secondary | ICD-10-CM | POA: Diagnosis not present

## 2024-10-23 DIAGNOSIS — Z853 Personal history of malignant neoplasm of breast: Secondary | ICD-10-CM | POA: Diagnosis not present

## 2024-10-23 DIAGNOSIS — I4719 Other supraventricular tachycardia: Secondary | ICD-10-CM | POA: Insufficient documentation

## 2024-10-23 DIAGNOSIS — Z79899 Other long term (current) drug therapy: Secondary | ICD-10-CM | POA: Diagnosis not present

## 2024-10-23 DIAGNOSIS — D509 Iron deficiency anemia, unspecified: Secondary | ICD-10-CM

## 2024-10-23 DIAGNOSIS — I471 Supraventricular tachycardia, unspecified: Secondary | ICD-10-CM

## 2024-10-23 DIAGNOSIS — L905 Scar conditions and fibrosis of skin: Secondary | ICD-10-CM

## 2024-10-23 DIAGNOSIS — C50411 Malignant neoplasm of upper-outer quadrant of right female breast: Secondary | ICD-10-CM

## 2024-10-23 DIAGNOSIS — K130 Diseases of lips: Secondary | ICD-10-CM | POA: Diagnosis not present

## 2024-10-23 HISTORY — PX: ADJACENT TISSUE TRANSFER/TISSUE REARRANGEMENT: SHX6829

## 2024-10-23 HISTORY — DX: Supraventricular tachycardia, unspecified: I47.10

## 2024-10-23 HISTORY — PX: SCAR REVISION OF FACE: SHX6533

## 2024-10-23 HISTORY — DX: Cardiac arrhythmia, unspecified: I49.9

## 2024-10-23 SURGERY — REVISION, SCAR, FACE
Anesthesia: General

## 2024-10-23 MED ORDER — PHENYLEPHRINE HCL (PRESSORS) 10 MG/ML IV SOLN
INTRAVENOUS | Status: DC | PRN
Start: 2024-10-23 — End: 2024-10-23
  Administered 2024-10-23: 80 ug via INTRAVENOUS

## 2024-10-23 MED ORDER — MIDAZOLAM HCL (PF) 2 MG/2ML IJ SOLN
INTRAMUSCULAR | Status: DC | PRN
  Administered 2024-10-23: 2 mg via INTRAVENOUS

## 2024-10-23 MED ORDER — ACETAMINOPHEN 500 MG PO TABS
1000.0000 mg | ORAL_TABLET | Freq: Once | ORAL | Status: AC
  Administered 2024-10-23: 1000 mg via ORAL

## 2024-10-23 MED ORDER — FENTANYL CITRATE (PF) 100 MCG/2ML IJ SOLN: 25.0000 ug | INTRAMUSCULAR | Status: DC | PRN

## 2024-10-23 MED ORDER — AMISULPRIDE (ANTIEMETIC) 5 MG/2ML IV SOLN: 10.0000 mg | Freq: Once | INTRAVENOUS | Status: DC | PRN

## 2024-10-23 MED ORDER — CHLORHEXIDINE GLUCONATE CLOTH 2 % EX PADS: 6.0000 | MEDICATED_PAD | Freq: Once | CUTANEOUS | Status: DC

## 2024-10-23 MED ORDER — BACITRACIN 500 UNIT/GM EX OINT
TOPICAL_OINTMENT | CUTANEOUS | Status: DC | PRN
  Administered 2024-10-23: 1 via TOPICAL

## 2024-10-23 MED ORDER — OXYCODONE HCL 5 MG/5ML PO SOLN: 5.0000 mg | Freq: Once | ORAL | Status: DC | PRN

## 2024-10-23 MED ORDER — ONDANSETRON HCL 4 MG/2ML IJ SOLN
INTRAMUSCULAR | Status: AC
  Filled 2024-10-23: qty 4

## 2024-10-23 MED ORDER — GABAPENTIN 300 MG PO CAPS
ORAL_CAPSULE | ORAL | Status: AC
  Filled 2024-10-23: qty 1

## 2024-10-23 MED ORDER — LIDOCAINE-EPINEPHRINE 1 %-1:100000 IJ SOLN
INTRAMUSCULAR | Status: DC | PRN
  Administered 2024-10-23: .5 mL

## 2024-10-23 MED ORDER — FENTANYL CITRATE (PF) 100 MCG/2ML IJ SOLN
INTRAMUSCULAR | Status: DC | PRN
  Administered 2024-10-23: 100 ug via INTRAVENOUS

## 2024-10-23 MED ORDER — PROPOFOL 10 MG/ML IV BOLUS
INTRAVENOUS | Status: DC | PRN
  Administered 2024-10-23: 200 mg via INTRAVENOUS

## 2024-10-23 MED ORDER — OXYCODONE HCL 5 MG PO TABS: 5.0000 mg | ORAL_TABLET | Freq: Once | ORAL | Status: DC | PRN

## 2024-10-23 MED ORDER — 0.9 % SODIUM CHLORIDE (POUR BTL) OPTIME
TOPICAL | Status: DC | PRN
  Administered 2024-10-23: 1000 mL

## 2024-10-23 MED ORDER — ROCURONIUM BROMIDE 10 MG/ML (PF) SYRINGE
PREFILLED_SYRINGE | INTRAVENOUS | Status: DC | PRN
  Administered 2024-10-23: 100 mg via INTRAVENOUS

## 2024-10-23 MED ORDER — CEFAZOLIN SODIUM-DEXTROSE 2-4 GM/100ML-% IV SOLN
INTRAVENOUS | Status: AC
  Filled 2024-10-23: qty 100

## 2024-10-23 MED ORDER — ACETAMINOPHEN 500 MG PO TABS
ORAL_TABLET | ORAL | Status: AC
  Filled 2024-10-23: qty 2

## 2024-10-23 MED ORDER — LIDOCAINE 2% (20 MG/ML) 5 ML SYRINGE
INTRAMUSCULAR | Status: DC | PRN
  Administered 2024-10-23: 60 mg via INTRAVENOUS

## 2024-10-23 MED ORDER — GABAPENTIN 300 MG PO CAPS
300.0000 mg | ORAL_CAPSULE | ORAL | Status: AC
  Administered 2024-10-23: 300 mg via ORAL

## 2024-10-23 MED ORDER — FENTANYL CITRATE (PF) 100 MCG/2ML IJ SOLN
INTRAMUSCULAR | Status: AC
  Filled 2024-10-23: qty 2

## 2024-10-23 MED ORDER — ACETAMINOPHEN 500 MG PO TABS: 500.0000 mg | ORAL_TABLET | Freq: Four times a day (QID) | ORAL | 0 refills | Status: AC | PRN

## 2024-10-23 MED ORDER — DEXMEDETOMIDINE HCL IN NACL 80 MCG/20ML IV SOLN
INTRAVENOUS | Status: AC
  Filled 2024-10-23: qty 20

## 2024-10-23 MED ORDER — BUPIVACAINE HCL (PF) 0.25 % IJ SOLN
INTRAMUSCULAR | Status: DC | PRN
  Administered 2024-10-23: 1 mL

## 2024-10-23 MED ORDER — OXYMETAZOLINE HCL 0.05 % NA SOLN
NASAL | Status: AC
  Filled 2024-10-23: qty 30

## 2024-10-23 MED ORDER — CEFAZOLIN SODIUM-DEXTROSE 2-4 GM/100ML-% IV SOLN
2.0000 g | INTRAVENOUS | Status: AC
  Administered 2024-10-23: 2 g via INTRAVENOUS

## 2024-10-23 MED ORDER — MIDAZOLAM HCL 2 MG/2ML IJ SOLN
INTRAMUSCULAR | Status: AC
  Filled 2024-10-23: qty 2

## 2024-10-23 MED ORDER — EPHEDRINE SULFATE (PRESSORS) 25 MG/5ML IV SOSY
PREFILLED_SYRINGE | INTRAVENOUS | Status: DC | PRN
  Administered 2024-10-23 (×2): 5 mg via INTRAVENOUS

## 2024-10-23 MED ORDER — ONDANSETRON HCL 4 MG/2ML IJ SOLN: 4.0000 mg | Freq: Once | INTRAMUSCULAR | Status: DC | PRN

## 2024-10-23 MED ORDER — ONDANSETRON HCL 4 MG/2ML IJ SOLN
INTRAMUSCULAR | Status: DC | PRN
  Administered 2024-10-23: 4 mg via INTRAVENOUS

## 2024-10-23 MED ORDER — HYDROGEN PEROXIDE 3 % EX SOLN
CUTANEOUS | Status: DC | PRN
  Administered 2024-10-23: 1 via TOPICAL

## 2024-10-23 MED ORDER — DEXAMETHASONE SOD PHOSPHATE PF 10 MG/ML IJ SOLN
INTRAMUSCULAR | Status: DC | PRN
  Administered 2024-10-23: 10 mg via INTRAVENOUS

## 2024-10-23 MED ORDER — SUGAMMADEX SODIUM 200 MG/2ML IV SOLN
INTRAVENOUS | Status: DC | PRN
  Administered 2024-10-23: 200 mg via INTRAVENOUS

## 2024-10-23 MED ORDER — LACTATED RINGERS IV SOLN: INTRAVENOUS | Status: DC

## 2024-10-23 SURGICAL SUPPLY — 34 items
BLADE MINI RND TIP GREEN BEAV (BLADE) IMPLANT
BLADE SURG 15 STRL LF DISP TIS (BLADE) ×2 IMPLANT
CANISTER SUCT 1200ML W/VALVE (MISCELLANEOUS) IMPLANT
CORD BIPOLAR FORCEPS 12FT (ELECTRODE) IMPLANT
COVER BACK TABLE 60X90IN (DRAPES) IMPLANT
COVER MAYO STAND STRL (DRAPES) IMPLANT
DRAPE UTILITY XL STRL (DRAPES) ×2 IMPLANT
GLOVE BIO SURGEON STRL SZ8 (GLOVE) ×2 IMPLANT
GLOVE BIOGEL PI IND STRL 7.0 (GLOVE) IMPLANT
GLOVE BIOGEL PI IND STRL 8 (GLOVE) ×2 IMPLANT
GLOVE SURG SS PI 7.0 STRL IVOR (GLOVE) IMPLANT
GOWN STRL REUS W/ TWL LRG LVL3 (GOWN DISPOSABLE) IMPLANT
GOWN STRL REUS W/ TWL XL LVL3 (GOWN DISPOSABLE) IMPLANT
GOWN STRL REUS W/TWL XL LVL3 (GOWN DISPOSABLE) ×2 IMPLANT
NDL HYPO 25GX1X1/2 BEV (NEEDLE) ×2 IMPLANT
NEEDLE HYPO 25GX1X1/2 BEV (NEEDLE) ×2 IMPLANT
NS IRRIG 1000ML POUR BTL (IV SOLUTION) IMPLANT
PACK BASIN DAY SURGERY FS (CUSTOM PROCEDURE TRAY) ×2 IMPLANT
PACK UNIVERSAL I (CUSTOM PROCEDURE TRAY) ×2 IMPLANT
SHEET MEDIUM DRAPE 40X70 STRL (DRAPES) IMPLANT
SLEEVE SCD COMPRESS KNEE MED (STOCKING) IMPLANT
SOL PREP POV-IOD 4OZ 10% (MISCELLANEOUS) ×2 IMPLANT
SPONGE T-LAP 18X18 ~~LOC~~+RFID (SPONGE) ×2 IMPLANT
STAPLER SKIN PROX WIDE 3.9 (STAPLE) ×2 IMPLANT
STOCKINETTE 6 STRL (DRAPES) ×2 IMPLANT
SUCTION TUBE FRAZIER 10FR DISP (SUCTIONS) IMPLANT
SUT CHROMIC 6 0 G 1 (SUTURE) IMPLANT
SUT ETHILON 6 0 PS 3 18 (SUTURE) IMPLANT
SUT MON AB 5-0 P3 18 (SUTURE) IMPLANT
SYR BULB EAR ULCER 3OZ GRN STR (SYRINGE) ×4 IMPLANT
SYR CONTROL 10ML LL (SYRINGE) ×2 IMPLANT
TOWEL GREEN STERILE FF (TOWEL DISPOSABLE) ×2 IMPLANT
TRAY DSU PREP LF (CUSTOM PROCEDURE TRAY) IMPLANT
TUBE CONNECTING 20X1/4 (TUBING) ×2 IMPLANT

## 2024-10-23 NOTE — Anesthesia Procedure Notes (Signed)
 Procedure Name: Intubation Date/Time: 10/23/2024 12:20 PM  Performed by: Delayne Olam BIRCH, CRNAPre-anesthesia Checklist: Patient identified, Emergency Drugs available, Suction available and Patient being monitored Patient Re-evaluated:Patient Re-evaluated prior to induction Oxygen Delivery Method: Circle system utilized Preoxygenation: Pre-oxygenation with 100% oxygen Induction Type: IV induction Ventilation: Mask ventilation without difficulty Laryngoscope Size: Glidescope, Mac and 3 Grade View: Grade I Tube type: Oral Rae Tube size: 7.0 mm Number of attempts: 1 Airway Equipment and Method: Stylet and Oral airway Placement Confirmation: ETT inserted through vocal cords under direct vision, positive ETCO2 and breath sounds checked- equal and bilateral Secured at: 21 cm Tube secured with: Tape Dental Injury: Teeth and Oropharynx as per pre-operative assessment  Comments: Upper and lower dentures removed before induction and intubation.  Per surgeon request, upper denture put back in place once ETT secured

## 2024-10-23 NOTE — Anesthesia Postprocedure Evaluation (Signed)
 Anesthesia Post Note  Patient: Christy [name]  Procedure(s) Performed: UPPER LIP SCAR REVISION ADJACENT TISSUE TRANSFER LIP     Patient location during evaluation: PACU Anesthesia Type: General Level of consciousness: awake and alert, oriented and patient cooperative Pain management: pain level controlled Vital Signs Assessment: post-procedure vital signs reviewed and stable Respiratory status: spontaneous breathing, nonlabored ventilation and respiratory function stable Cardiovascular status: blood pressure returned to baseline and stable Postop Assessment: no apparent nausea or vomiting Anesthetic complications: no   No notable events documented.  Last Vitals:  Vitals:   10/23/24 1358 10/23/24 1415  BP: (!) 179/79 (!) 154/75  Pulse: 78 75  Resp: 15 15  Temp: (!) 36.3 C   SpO2: 94% 100%    Last Pain:  Vitals:   10/23/24 1358  TempSrc:   PainSc: Asleep                 [name]

## 2024-10-23 NOTE — Op Note (Signed)
 Operative Note   DATE OF OPERATION: 10/23/2024  LOCATION: [facility]  SURGICAL DEPARTMENT: Plastic Surgery  PREOPERATIVE DIAGNOSES:  Upper lip trauma with deformity due to secondary intention healing.  POSTOPERATIVE DIAGNOSES:  same  PROCEDURE:   Left philtrum column scar revision Adjacent tissue transfer for repair of white roll and vermillion border  SURGEON: Prestyn Mahn, MD  ASSISTANT: N/A  ANESTHESIA:  General.   COMPLICATIONS: None.   BLOOD LOSS < 5 cc  INDICATIONS FOR PROCEDURE:  The patient, Christy [name] is a 68 y.o. female born on October 01, 1956, is here for treatment of upper lip deformity due to trauma.   MRN: [id number]  CONSENT:  Informed consent was obtained directly from the patient. Risks, benefits and alternatives were fully discussed. Specific risks including but not limited to asymmetries, bleeding, infection, hematoma, seroma, scarring, pain, infection, contracture, asymmetry, wound healing problems, and need for further surgery were all discussed. The patient did have an ample opportunity to have questions answered to satisfaction.   DESCRIPTION OF PROCEDURE:  The patient was taken to the operating room. SCDs were placed and IV antibiotics were given. The patient's operative site was prepped and draped in a sterile fashion. A time out was performed and all information was confirmed to be correct. General anesthesia was administered.    Left philtrum collum scar revision (5 mm) Local anesthesia with 1% lidocaine  with epinephrine  was infiltrated into the left upper lip region (1 cc). Attention was directed to the left philtrum column, where an irregular, contracted scar was identified. This scar was previously lasered. The scar was excised in an elliptical fashion down to healthy tissue, carefully preserving the underlying orbicularis oris muscle. The wound edges were freshened to create viable dermal margins, and any fibrotic tissue was released to improve  contour. Hemostasis was achieved and edges were approximated with 6-0 Nylon sutures, following the line of the philtrum column.  2. Adjacent tissue transfer (romboid flap) for repair of white roll and vermillion border (4 mm x 6 mm)  A rhomboid flap was then designed in the adjacent upper lip tissue. The flap was incised and elevated in the subcutaneous plane, maintaining an adequate vascular pedicle from the superior labial artery branches and protecting the orbicularis oris. Hemostasis was achieved with bipolar. The flap was then rotated and advanced to correct the white roll and vermillion border deformities the patient presented with, carefully matching the vermilion and white roll to achieve seamless contour and color continuity. The upper lip was undermined bilaterally to reduce tension and the defect was closed with deep interrupted 5-0 Monocryl, and 6-0 nylon and 6-0 Chromic sutures were used for precise skin and vermilion border approximation. The repair demonstrated alignment of the philtral column and white roll with good tissue perfusion. Final inspection showed hemostasis and satisfactory aesthetic restoration. The wound was dressed with antibiotic ointment. Marcaine  0.25% was used for local lip block.  The patient tolerated the procedure well.  There were no complications. The patient was allowed to wake from anesthesia, extubated and taken to the recovery room in satisfactory condition.   Geovani Tootle M. Anchor Dwan, MD [facility]

## 2024-10-23 NOTE — Discharge Instructions (Addendum)
 Activity (include date of return to work if known) As tolerated: NO showers for 48 hours NO driving No heavy activities  Diet: Pureed Diet Restrictions: No straws, no hard objects in the mouth for 2 weeks.   Wound Care: Keep dressing clean & dry. Can clean it with wet Q-tip gently  Do not change dressings Special Instructions: Call Doctor if any unusual problems occur such as pain, excessive Bleeding, unrelieved Nausea/vomiting, Fever &/or chills When lying down, keep head elevated on 2-3 pillows or back-rest Finish your course of Valacyclovir .  Follow-up appointment: Please call the office.  The patient received discharge instruction from:___________________________________________   Patient signature ________________________________________ / Date___________    Signature of individual providing instructions/ Date________________                Post Anesthesia Home Care Instructions  Activity: Get plenty of rest for the remainder of the day. A responsible individual must stay with you for 24 hours following the procedure.  For the next 24 hours, DO NOT: -Drive a car -Advertising copywriter -Drink alcoholic beverages -Take any medication unless instructed by your physician -Make any legal decisions or sign important papers.  Meals: Start with liquid foods such as gelatin or soup. Progress to regular foods as tolerated. Avoid greasy, spicy, heavy foods. If nausea and/or vomiting occur, drink only clear liquids until the nausea and/or vomiting subsides. Call your physician if vomiting continues.  Special Instructions/Symptoms: Your throat may feel dry or sore from the anesthesia or the breathing tube placed in your throat during surgery. If this causes discomfort, gargle with warm salt water. The discomfort should disappear within 24 hours.  If you had a scopolamine patch placed behind your ear for the management of post- operative nausea and/or vomiting:  1. The medication in  the patch is effective for 72 hours, after which it should be removed.  Wrap patch in a tissue and discard in the trash. Wash hands thoroughly with soap and water. 2. You may remove the patch earlier than 72 hours if you experience unpleasant side effects which may include dry mouth, dizziness or visual disturbances. 3. Avoid touching the patch. Wash your hands with soap and water after contact with the patch.    Next dose of tylenol  4:49pm

## 2024-10-23 NOTE — Interval H&P Note (Signed)
 History and Physical Interval Note:  10/23/2024 11:27 AM  Christy [name]  has presented today for surgery, with the diagnosis of l90.5.  The various methods of treatment have been discussed with the patient and family. After consideration of risks, benefits and other options for treatment, the patient has consented to  Procedure(s) with comments: REVISION, SCAR, FACE (N/A) - Lip scar revision, possible tissue rearrangement as a surgical intervention.  The patient's history has been reviewed, patient examined, no change in status, stable for surgery.  I have reviewed the patient's chart and labs.  Questions were answered to the patient's satisfaction.     Amal Renbarger M Travion Ke

## 2024-10-23 NOTE — Transfer of Care (Signed)
 Immediate Anesthesia Transfer of Care Note  Patient: Christy [name]  Procedure(s) Performed: Procedure(s) (LRB): UPPER LIP SCAR REVISION (N/A) ADJACENT TISSUE TRANSFER LIP  Patient Location: PACU  Anesthesia Type: General  Level of Consciousness: awake, oriented, sedated and patient cooperative  Airway & Oxygen Therapy: Patient Spontanous Breathing and Patient connected to face mask oxygen  Post-op Assessment: Report given to PACU RN and Post -op Vital signs reviewed and stable  Post vital signs: Reviewed and stable  Complications: No apparent anesthesia complications Last Vitals:  Vitals Value Taken Time  BP 179/79 10/23/24 14:00  Temp 36.3 C 10/23/24 13:58  Pulse 75 10/23/24 14:06  Resp 15 10/23/24 14:06  SpO2 98 % 10/23/24 14:06  Vitals shown include unfiled device data.  Last Pain:  Vitals:   10/23/24 1036  TempSrc: Temporal  PainSc: 0-No pain         Complications: No notable events documented.

## 2024-10-23 NOTE — Brief Op Note (Signed)
 10/23/2024  1:52 PM  PATIENT:  Aureliano ONEIDA Rodriguez  68 y.o. female  PRE-OPERATIVE DIAGNOSIS:  l90.5  POST-OPERATIVE DIAGNOSIS:  l90.5  PROCEDURE:  Procedure(s) with comments: UPPER LIP SCAR REVISION (N/A) - Lip scar revision ADJACENT TISSUE TRANSFER LIP - ADJACENT TISSUE TRANSFER LIP  SURGEON:  Surgeons and Role:    * Orelia Brandstetter, Nieves HERO, MD - Primary  PHYSICIAN ASSISTANT:   ASSISTANTS: none   ANESTHESIA:   local and general  EBL:  <5 cc   BLOOD ADMINISTERED:none  DRAINS: none   LOCAL MEDICATIONS USED:  MARCAINE   0.25% and LIDOCAINE  1%  w/ epinephrine    SPECIMEN:  No Specimen  DISPOSITION OF SPECIMEN:  N/A  COUNTS:  YES  DICTATION: .Dragon Dictation  PLAN OF CARE: Discharge to home after PACU  PATIENT DISPOSITION:  PACU - hemodynamically stable.   Julena Barbour M. Lorane Cousar, MD [facility]

## 2024-10-24 ENCOUNTER — Inpatient Hospital Stay

## 2024-10-24 ENCOUNTER — Encounter (HOSPITAL_BASED_OUTPATIENT_CLINIC_OR_DEPARTMENT_OTHER): Payer: Self-pay

## 2024-10-24 ENCOUNTER — Inpatient Hospital Stay: Payer: Self-pay | Attending: Hematology and Oncology | Admitting: Hematology and Oncology

## 2024-10-24 VITALS — BP 146/71 | HR 70 | Temp 98.4°F | Resp 17 | Ht 66.5 in

## 2024-10-24 DIAGNOSIS — D509 Iron deficiency anemia, unspecified: Secondary | ICD-10-CM

## 2024-10-24 DIAGNOSIS — Z79899 Other long term (current) drug therapy: Secondary | ICD-10-CM | POA: Diagnosis not present

## 2024-10-24 DIAGNOSIS — C50411 Malignant neoplasm of upper-outer quadrant of right female breast: Secondary | ICD-10-CM

## 2024-10-24 DIAGNOSIS — M818 Other osteoporosis without current pathological fracture: Secondary | ICD-10-CM | POA: Diagnosis present

## 2024-10-24 DIAGNOSIS — R748 Abnormal levels of other serum enzymes: Secondary | ICD-10-CM | POA: Diagnosis not present

## 2024-10-24 DIAGNOSIS — Z853 Personal history of malignant neoplasm of breast: Secondary | ICD-10-CM | POA: Insufficient documentation

## 2024-10-24 DIAGNOSIS — Z170 Estrogen receptor positive status [ER+]: Secondary | ICD-10-CM

## 2024-10-24 DIAGNOSIS — E039 Hypothyroidism, unspecified: Secondary | ICD-10-CM

## 2024-10-24 LAB — CMP (CANCER CENTER ONLY)
ALT: 18 U/L (ref 0–44)
AST: 23 U/L (ref 15–41)
Albumin: 4 g/dL (ref 3.5–5.0)
Alkaline Phosphatase: 90 U/L (ref 38–126)
Anion gap: 6 (ref 5–15)
BUN: 8 mg/dL (ref 8–23)
CO2: 30 mmol/L (ref 22–32)
Calcium: 9.8 mg/dL (ref 8.9–10.3)
Chloride: 105 mmol/L (ref 98–111)
Creatinine: 0.76 mg/dL (ref 0.44–1.00)
GFR, Estimated: >60 mL/min (ref >60)
Glucose, Bld: 110 mg/dL — ABNORMAL HIGH (ref 70–99)
Potassium: 4.1 mmol/L (ref 3.5–5.1)
Sodium: 141 mmol/L (ref 135–145)
Total Bilirubin: 0.5 mg/dL (ref 0.0–1.2)
Total Protein: 7.1 g/dL (ref 6.5–8.1)

## 2024-10-24 LAB — CBC WITH DIFFERENTIAL (CANCER CENTER ONLY)
Abs Immature Granulocytes: 0.01 K/uL (ref 0.00–0.07)
Basophils Absolute: 0 K/uL (ref 0.0–0.1)
Basophils Relative: 0 %
Eosinophils Absolute: 0 K/uL (ref 0.0–0.5)
Eosinophils Relative: 0 %
HCT: 40 % (ref 36.0–46.0)
Hemoglobin: 13.1 g/dL (ref 12.0–15.0)
Immature Granulocytes: 0 %
Lymphocytes Relative: 18 %
Lymphs Abs: 1 K/uL (ref 0.7–4.0)
MCH: 29.5 pg (ref 26.0–34.0)
MCHC: 32.8 g/dL (ref 30.0–36.0)
MCV: 90.1 fL (ref 80.0–100.0)
Monocytes Absolute: 0.5 K/uL (ref 0.1–1.0)
Monocytes Relative: 8 %
Neutro Abs: 4.2 K/uL (ref 1.7–7.7)
Neutrophils Relative %: 74 %
Platelet Count: 171 K/uL (ref 150–400)
RBC: 4.44 MIL/uL (ref 3.87–5.11)
RDW: 12.3 % (ref 11.5–15.5)
WBC Count: 5.8 K/uL (ref 4.0–10.5)
nRBC: 0 % (ref 0.0–0.2)

## 2024-10-24 LAB — LIPID PANEL
Cholesterol: 176 mg/dL (ref 0–200)
HDL: 51 mg/dL (ref >40)
LDL Cholesterol: 112 mg/dL — ABNORMAL HIGH (ref 0–99)
Total CHOL/HDL Ratio: 3.4 ratio
Triglycerides: 64 mg/dL (ref <150)
VLDL: 13 mg/dL (ref 0–40)

## 2024-10-24 LAB — FERRITIN: Ferritin: 93 ng/mL (ref 11–307)

## 2024-10-24 LAB — IRON AND IRON BINDING CAPACITY (CC-WL,HP ONLY)
Iron: 62 ug/dL (ref 28–170)
Saturation Ratios: 17 % (ref 10.4–31.8)
TIBC: 365 ug/dL (ref 250–450)
UIBC: 303 ug/dL (ref 148–442)

## 2024-10-24 LAB — TSH: TSH: 1.83 u[IU]/mL (ref 0.350–4.500)

## 2024-10-24 MED ORDER — DENOSUMAB 60 MG/ML ~~LOC~~ SOSY
60.0000 mg | PREFILLED_SYRINGE | Freq: Once | SUBCUTANEOUS | Status: AC
  Administered 2024-10-24: 60 mg via SUBCUTANEOUS
  Filled 2024-10-24: qty 1

## 2024-10-24 NOTE — Progress Notes (Addendum)
 Patient Care Team: Joyce Norleen BROCKS, MD as PCP - General (Family Medicine) Jordan, Peter M, MD as PCP - Cardiology (Cardiology)  DIAGNOSIS:  Encounter Diagnosis  Name Primary?   Malignant neoplasm of upper-outer quadrant of right breast in female, estrogen receptor positive (HCC) Yes    SUMMARY OF ONCOLOGIC HISTORY: Oncology History  Malignant neoplasm of upper-outer quadrant of right breast in female, estrogen receptor positive (HCC)  1993 Initial Diagnosis   Breast cancer (HCC): right breast, stage 2, 3/41 nodes involved, ER/PR negative, HER-2 not tested.  , treated on NSABP 525 with Adriamycin, Cytoxan   06/2007 Relapse/Recurrence   Right breast cancer.  Bilateral mastectomies with reconstruction: Right: IDC, 0.6cm, grade 1, 3 SLN negative, T1b, N0, ER+(84%), PR+(38%), Ki-67 14%, HER-2 negative.   Left: negative   06/2007 Surgery   TAH/BSO   06/2007 Genetic Testing   Negative genetic testing   07/2007 - 10/2014 Anti-estrogen oral therapy   Letrozole  daily     CHIEF COMPLIANT: Follow-up to discuss Prolia  injection, surveillance for breast cancer  HISTORY OF PRESENT ILLNESS:  History of Present Illness Christy [name] is a 68 year old female who presents for follow-up regarding Prolia  injection and elevated alkaline phosphatase levels.  She is concerned about slightly elevated alkaline phosphatase levels, first noted in August, and is awaiting recent blood work results for further evaluation. She expresses concern about potential liver or bile duct issues, despite not having a gallbladder.  She is due for her Prolia  injection and prefers to receive it at her doctor's office due to anxiety about the infusion center environment. She has not responded to scheduling calls from the infusion center due to uncertainty about the injection location.  She is currently taking tricepazide, started in early October, and has experienced an eight-pound weight loss since beginning the  medication. She has discussed this medication with her primary care provider.     ALLERGIES:  is allergic to benzoin, dilaudid  [hydromorphone  hcl], and levaquin  [levofloxacin  in d5w].  MEDICATIONS:  Current Outpatient Medications  Medication Sig Dispense Refill   acetaminophen  (TYLENOL ) 500 MG tablet Take 1 tablet (500 mg total) by mouth every 6 (six) hours as needed. 30 tablet 0   B Complex-C (B-COMPLEX WITH VITAMIN C) tablet Take 1 tablet by mouth daily.     celecoxib  (CELEBREX ) 200 MG capsule Take 400 mg by mouth daily as needed.     cetirizine (ZYRTEC) 10 MG tablet      Cholecalciferol  (VITAMIN D3) 5000 units CAPS Take 5,000 Units by mouth daily.      clobetasol  cream (TEMOVATE ) 0.05 % Apply 1 Application topically 2 (two) times daily. 45 g 2   cyclobenzaprine  (FLEXERIL ) 10 MG tablet Take 1 tablet (10 mg total) by mouth 3 (three) times daily as needed for muscle spasms. 30 tablet 1   cycloSPORINE  (RESTASIS ) 0.05 % ophthalmic emulsion Place 2 drops into both eyes daily.     diltiazem  (CARDIZEM  CD) 240 MG 24 hr capsule Take 1 capsule (240 mg total) by mouth daily. 90 capsule 3   fexofenadine (ALLEGRA) 180 MG tablet Take 180 mg by mouth daily as needed for allergies or rhinitis.     hydrochlorothiazide  (MICROZIDE ) 12.5 MG capsule Take 1 capsule (12.5 mg total) by mouth daily. 90 capsule 3   levothyroxine  (SYNTHROID ) 50 MCG tablet TAKE 1 TABLET(50 MCG) BY MOUTH DAILY 90 tablet 1   lisdexamfetamine (VYVANSE ) 50 MG capsule Take 1 capsule (50 mg total) by mouth daily. 30 capsule 0  Multiple Vitamin (MULTIVITAMIN) capsule Take 3 capsules by mouth daily.      ondansetron  (ZOFRAN -ODT) 4 MG disintegrating tablet Take 1 tablet (4 mg total) by mouth every 8 (eight) hours as needed for nausea or vomiting. 12 tablet 0   tirzepatide (ZEPBOUND) 2.5 MG/0.5ML Pen Inject 2.5 mg into the skin once a week.     valACYclovir  (VALTREX ) 500 MG tablet Take 1 tablet (500 mg total) by mouth daily. 30 tablet 0    venlafaxine  XR (EFFEXOR  XR) 150 MG 24 hr capsule Take 1 capsule (150 mg total) by mouth daily with breakfast. 90 capsule 3   No current facility-administered medications for this visit.    PHYSICAL EXAMINATION: ECOG PERFORMANCE STATUS: 1 - Symptomatic but completely ambulatory  Vitals:   10/24/24 0859  BP: (!) 146/71  Pulse: 70  Resp: 17  Temp: 98.4 F (36.9 C)  SpO2: 100%   There were no vitals filed for this visit. Breast exam: Bilateral reconstructed breast without any palpable lumps or nodules  LABORATORY DATA:  I have reviewed the data as listed    Latest Ref Rng & Units 10/19/2024   11:41 AM 08/15/2024    3:02 PM 10/25/2023    8:02 AM  CMP  Glucose 70 - 99 mg/dL 897  895  898   BUN 8 - 23 mg/dL 8  10  8    Creatinine 0.44 - 1.00 mg/dL 9.09  9.32  9.35   Sodium 135 - 145 mmol/L 140  137  140   Potassium 3.5 - 5.1 mmol/L 4.5  4.4  4.1   Chloride 98 - 111 mmol/L 107  99  106   CO2 22 - 32 mmol/L 25  24  28    Calcium 8.9 - 10.3 mg/dL 9.1  9.5  9.1   Total Protein 6.0 - 8.5 g/dL  6.5  6.5   Total Bilirubin 0.0 - 1.2 mg/dL  0.3  0.4   Alkaline Phos 44 - 121 IU/L  140  87   AST 0 - 40 IU/L  22  24   ALT 0 - 32 IU/L  18  21     Lab Results  Component Value Date   WBC 5.8 10/24/2024   HGB 13.1 10/24/2024   HCT 40.0 10/24/2024   MCV 90.1 10/24/2024   PLT 171 10/24/2024   NEUTROABS 4.2 10/24/2024    ASSESSMENT & PLAN:  Malignant neoplasm of upper-outer quadrant of right breast in female, estrogen receptor positive (HCC) History of 2 primary right breast cancers: on surveillance since she completed 7 years [facility]  10-2014, clinically doing well.    Osteopenia: 01/10/2020: Bone density: T score -2.5: Osteoporosis, recommended calcium vitamin D  and bisphosphonate therapy   Breast cancer surveillance:  Hysterectomy with oophorectomy 2008 prophylactic left mastectomy, bilateral breast reconstructions No role of imaging since she had bilateral mastectomies Chest exam  10/24/2024: Benign   Patient has been receiving Prolia  injections at the infusion center.  She would like to switch over to the cancer center.  I put new orders for Prolia  injections and counseled her about osteonecrosis of the jaw a risk factor.  Return to clinic every 6 months for Prolia  injections with labs and I will see her back in 1 year.  No orders of the defined types were placed in this encounter.  The patient has a good understanding of the overall plan. she agrees with it. she will call with any problems that may develop before the next visit here.  I personally  spent a total of 30 minutes in the care of the patient today including preparing to see the patient, getting/reviewing separately obtained history, performing a medically appropriate exam/evaluation, counseling and educating, placing orders, referring and communicating with other health care professionals, documenting clinical information in the EHR, independently interpreting results, communicating results, and coordinating care.   Viinay K Jhanae Jaskowiak, MD 10/24/24

## 2024-10-24 NOTE — Assessment & Plan Note (Signed)
 History of 2 primary right breast cancers: on surveillance since she completed 7 years [facility]  10-2014, clinically doing well.    Osteopenia: 01/10/2020: Bone density: T score -2.5: Osteoporosis, recommended calcium vitamin D  and bisphosphonate therapy   Breast cancer surveillance:  Hysterectomy with oophorectomy 2008 prophylactic left mastectomy, bilateral breast reconstructions No role of imaging since she had bilateral mastectomies Chest exam 10/16/2024: Benign   Cloudiness in the head and decreased memory: She will continue to work on exercises.  She got herself a puppy which is helping her.

## 2024-10-25 ENCOUNTER — Ambulatory Visit: Payer: PRIVATE HEALTH INSURANCE | Admitting: Hematology and Oncology

## 2024-10-26 ENCOUNTER — Encounter: Admitting: Gastroenterology

## 2024-11-01 ENCOUNTER — Ambulatory Visit

## 2024-11-01 VITALS — BP 131/83 | HR 66 | Ht 66.0 in | Wt 195.0 lb

## 2024-11-01 DIAGNOSIS — Z09 Encounter for follow-up examination after completed treatment for conditions other than malignant neoplasm: Secondary | ICD-10-CM

## 2024-11-01 DIAGNOSIS — Z9889 Other specified postprocedural states: Secondary | ICD-10-CM

## 2024-11-01 NOTE — Progress Notes (Signed)
   Established Patient Office Visit  Subjective   Patient ID: Christy [name], female    DOB: June 18, 1956  Age: 68 y.o. MRN: [id number]  Chief Complaint  Patient presents with   Post-op Follow-up    postop:Lip scar revision/    HPI 68 year old female who underwent repair of lip deformity after trauma.  I performed an excision/revision of left philtral column scar as well as an adjacent tissue transfer of white roll and vermilion border.  Since today for removal of stitches.  Pain has been under control no issues with the repair.    Objective:     BP 131/83   Pulse 66   Ht 5' 6 (1.676 m)   Wt 195 lb (88.5 kg)   SpO2 98%   BMI 31.47 kg/m    Physical Exam Left philtral column with stitches in place.  Some reactive erythema around the stitches.  White roll under medial border seem to be well aligned yet there is scab in this area.  It is adherent to the surrounding tissues.  Applied Vaseline today to soften it up. No results found for any visits on 11/01/24.    The 10-year ASCVD risk score (Arnett DK, et al., 2019) is: 10.4%    Assessment & Plan:   Problem List Items Addressed This Visit   None Visit Diagnoses       Follow-up exam    -  Primary      68 year old female who underwent repair of lip deformity after trauma.  I performed an excision/revision of left philtral column scar as well as an adjacent tissue transfer of white roll and vermilion border.   - Nonabsorbable stitches removed today.  Vaseline applied to the scab.  Instructed to apply Vaseline twice a day to soften the scab.  Instructed not to remove the scab and let it fall off by itself. - Instructed patient to keep the lip covered at all times when outside.  Do not perform scar massage for another 2 weeks or until fully healed.  Do not use any silicone tape for another 2 weeks or until fully healed. - Reviewed results and expectations.  Explained to the patient that down the road we may do some laser if  needed. - All questions answered to patient's satisfaction, will follow-up with PA in 2 weeks and with me in 3 months.  Christy Vavrek M Gregery Walberg, MD

## 2024-11-02 ENCOUNTER — Telehealth: Payer: Self-pay

## 2024-11-02 NOTE — Telephone Encounter (Signed)
 Per md orders placed for [facility] through their portal.

## 2024-11-09 ENCOUNTER — Encounter: Admitting: Student

## 2024-11-09 NOTE — Progress Notes (Deleted)
 Patient is a 68 year old female who recently underwent left philtral column scar revision and adjacent tissue transfer repair of white roll and vermilion border with Dr. Montorfano on 10/23/2024.  Patient is about 2-1/2 weeks postop.  She presents to the clinic today for postoperative follow-up.  Patient was last seen in the clinic on 11/01/2024.  At this visit, left philtral column was noted to have stitches in place.  There was some reactive erythema around the stitches.  White roll under the medial border seem to be well aligned, there was a scab in this area.  The nonabsorbable stitches were removed at this visit.  It was recommended that patient apply Vaseline twice daily to the area.  Plan is for patient to keep the lip covered at all times when she was outside.  Today,

## 2024-11-13 ENCOUNTER — Ambulatory Visit (INDEPENDENT_AMBULATORY_CARE_PROVIDER_SITE_OTHER): Admitting: Student

## 2024-11-13 VITALS — BP 152/90 | HR 80

## 2024-11-13 DIAGNOSIS — Z9889 Other specified postprocedural states: Secondary | ICD-10-CM

## 2024-11-13 DIAGNOSIS — Z09 Encounter for follow-up examination after completed treatment for conditions other than malignant neoplasm: Secondary | ICD-10-CM

## 2024-11-13 NOTE — Progress Notes (Signed)
 Patient is a 68 year old female who recently underwent left philtral column scar revision and adjacent tissue transfer repair of white roll and vermilion border with Dr. Montorfano on 10/23/2024.  Patient is about 2-1/2 weeks postop.  She presents to the clinic today for postoperative follow-up.  Patient was last seen in the clinic on 11/01/2024.  At this visit, left philtral column was noted to have stitches in place.  There was some reactive erythema around the stitches.  White roll under the medial border seem to be well aligned, there was a scab in this area.  The nonabsorbable stitches were removed at this visit.  It was recommended that patient apply Vaseline twice daily to the area.  Plan is for patient to keep the lip covered at all times when she was outside.  Today, patient reports she is overall doing well.  She states that she has a little bit of firmness underneath the incision.  Denies any other issues or concerns with the area.  On exam, patient is sitting upright in no acute distress.  Incision appears to be overall well-healed.  There is some firmness underneath the incision consistent with scar tissue.  There are no signs of infection on exam.  Discussed with the patient that she may start with silicone based scar creams/tapes.  Recommended that she gently massage the incision 1-2 times daily.  Patient expressed understanding.  Patient to follow back up with Dr. Montorfano in about 3 months.  Instructed her to call back in the meantime she has any questions or concerns about anything.  Pictures were obtained of the patient and placed in the chart with the patient's or guardian's permission.

## 2024-11-14 ENCOUNTER — Encounter: Admitting: Surgical

## 2024-11-15 ENCOUNTER — Encounter: Admitting: Surgical

## 2024-11-17 NOTE — Progress Notes (Signed)
 Received message from [facility] team stating patient was non responsive to mobile phlebotomy team.  Testing will be canceled at this time.  If pt wishes to proceed in the future, orders will be placed.

## 2025-01-04 ENCOUNTER — Telehealth: Payer: Self-pay

## 2025-01-04 NOTE — Telephone Encounter (Signed)
 Attempted to reach patient concerning colonoscopy recall; unable to speak with patient;  left message and number to the office for patient to call back and schedule appts;

## 2025-02-02 ENCOUNTER — Ambulatory Visit

## 2025-03-14 ENCOUNTER — Ambulatory Visit

## 2025-04-24 ENCOUNTER — Inpatient Hospital Stay

## 2025-10-24 ENCOUNTER — Inpatient Hospital Stay

## 2025-10-24 ENCOUNTER — Inpatient Hospital Stay: Admitting: Hematology and Oncology
